# Patient Record
Sex: Female | Born: 1960 | Race: White | Hispanic: No | Marital: Single | Smoking: Former smoker
Health system: Southern US, Community
[De-identification: ages and names within clinical notes are randomized; demographics above are authoritative.]

## PROBLEM LIST (undated history)

## (undated) DIAGNOSIS — E039 Hypothyroidism, unspecified: Secondary | ICD-10-CM

## (undated) DIAGNOSIS — I10 Essential (primary) hypertension: Secondary | ICD-10-CM

## (undated) DIAGNOSIS — Z8719 Personal history of other diseases of the digestive system: Secondary | ICD-10-CM

## (undated) DIAGNOSIS — J45909 Unspecified asthma, uncomplicated: Secondary | ICD-10-CM

## (undated) DIAGNOSIS — E079 Disorder of thyroid, unspecified: Secondary | ICD-10-CM

## (undated) DIAGNOSIS — G4733 Obstructive sleep apnea (adult) (pediatric): Secondary | ICD-10-CM

## (undated) DIAGNOSIS — L309 Dermatitis, unspecified: Secondary | ICD-10-CM

## (undated) DIAGNOSIS — C73 Malignant neoplasm of thyroid gland: Secondary | ICD-10-CM

## (undated) DIAGNOSIS — K589 Irritable bowel syndrome without diarrhea: Secondary | ICD-10-CM

## (undated) HISTORY — PX: KIDNEY SURGERY: SHX687

## (undated) HISTORY — DX: Irritable bowel syndrome, unspecified: K58.9

## (undated) HISTORY — PX: ADENOIDECTOMY: SUR15

## (undated) HISTORY — DX: Obstructive sleep apnea (adult) (pediatric): G47.33

## (undated) HISTORY — DX: Unspecified asthma, uncomplicated: J45.909

## (undated) HISTORY — DX: Malignant neoplasm of thyroid gland: C73

## (undated) HISTORY — PX: VAGINAL PROLAPSE REPAIR: SHX830

## (undated) HISTORY — PX: APPENDECTOMY: SHX54

## (undated) HISTORY — DX: Hypothyroidism, unspecified: E03.9

## (undated) HISTORY — PX: THYROID LOBECTOMY: SHX420

## (undated) HISTORY — PX: URETERAL REIMPLANTION: SHX2611

## (undated) HISTORY — DX: Morbid (severe) obesity due to excess calories: E66.01

## (undated) HISTORY — PX: TONSILLECTOMY: SUR1361

## (undated) HISTORY — PX: COLONOSCOPY: SHX174

## (undated) HISTORY — DX: Personal history of other diseases of the digestive system: Z87.19

## (undated) HISTORY — PX: ABDOMINAL HYSTERECTOMY: SHX81

## (undated) HISTORY — PX: CHOLECYSTECTOMY: SHX55

---

## 2018-08-15 ENCOUNTER — Other Ambulatory Visit: Payer: Self-pay

## 2018-08-15 ENCOUNTER — Emergency Department (HOSPITAL_COMMUNITY)
Admission: EM | Admit: 2018-08-15 | Discharge: 2018-08-15 | Disposition: A | Discharge status: Home / Self Care | Payer: Self-pay | Attending: Emergency Medicine | Admitting: Emergency Medicine

## 2018-08-15 ENCOUNTER — Encounter (HOSPITAL_COMMUNITY): Payer: Self-pay | Admitting: *Deleted

## 2018-08-15 ENCOUNTER — Emergency Department (HOSPITAL_COMMUNITY): Payer: Self-pay

## 2018-08-15 DIAGNOSIS — X58XXXA Exposure to other specified factors, initial encounter: Secondary | ICD-10-CM | POA: Insufficient documentation

## 2018-08-15 DIAGNOSIS — I10 Essential (primary) hypertension: Secondary | ICD-10-CM

## 2018-08-15 DIAGNOSIS — S8391XA Sprain of unspecified site of right knee, initial encounter: Secondary | ICD-10-CM | POA: Insufficient documentation

## 2018-08-15 DIAGNOSIS — Y929 Unspecified place or not applicable: Secondary | ICD-10-CM | POA: Insufficient documentation

## 2018-08-15 DIAGNOSIS — Y999 Unspecified external cause status: Secondary | ICD-10-CM | POA: Insufficient documentation

## 2018-08-15 DIAGNOSIS — Y9389 Activity, other specified: Secondary | ICD-10-CM | POA: Insufficient documentation

## 2018-08-15 DIAGNOSIS — Z87891 Personal history of nicotine dependence: Secondary | ICD-10-CM | POA: Insufficient documentation

## 2018-08-15 HISTORY — DX: Dermatitis, unspecified: L30.9

## 2018-08-15 HISTORY — DX: Disorder of thyroid, unspecified: E07.9

## 2018-08-15 HISTORY — DX: Essential (primary) hypertension: I10

## 2018-08-15 MED ORDER — HYDROCHLOROTHIAZIDE 25 MG PO TABS: 25.0000 mg | ORAL_TABLET | Freq: Every day | ORAL | 0 refills | Status: DC

## 2018-08-15 MED ORDER — DICLOFENAC SODIUM 50 MG PO TBEC: 50.0000 mg | DELAYED_RELEASE_TABLET | Freq: Two times a day (BID) | ORAL | 0 refills | Status: DC

## 2018-08-15 NOTE — ED Triage Notes (Signed)
Pt c/o right knee pain x one week; pt states today she went to get up out of her recliner and heard a "snap"; pt states the pain is behind the knee and states she is unable to bear weight on that leg

## 2018-08-15 NOTE — ED Provider Notes (Signed)
[facility] EMERGENCY DEPARTMENT Provider Note   CSN: [id number] Arrival date & time: 08/15/18  1952     History   Chief Complaint Chief Complaint  Patient presents with  . Knee Pain    HPI Margaret Schmitt is a 57 y.o. female.  The history is provided by the patient. No language interpreter was used.  Knee Pain   This is a new problem. The current episode started more than 1 week ago. The problem occurs constantly. The problem has been gradually worsening. The pain is present in the right knee. The quality of the pain is described as aching. The pain is moderate. Associated symptoms include limited range of motion. She has tried nothing for the symptoms. The treatment provided no relief.  Pt reports knee has been painful for a week.  Pt reports she stood up from her recliner tonight and knee popped.  Pt complains of swelling and pain  Past Medical History:  Diagnosis Date  . Eczema   . Hypertension   . Thyroid disease    hypothyroidism    There are no active problems to display for this patient.   Past Surgical History:  Procedure Laterality Date  . ABDOMINAL HYSTERECTOMY    . ADENOIDECTOMY    . APPENDECTOMY    . CHOLECYSTECTOMY    . KIDNEY SURGERY     left ureter reimplantation  . THYROID LOBECTOMY Left   . TONSILLECTOMY       OB History   None      Home Medications    Prior to Admission medications   Not on File    Family History History reviewed. No pertinent family history.  Social History Social History   Tobacco Use  . Smoking status: Former Research scientist (life sciences)  . Smokeless tobacco: Never Used  Substance Use Topics  . Alcohol use: Yes    Comment: rarely  . Drug use: Not Currently     Allergies   Erythromycin   Review of Systems Review of Systems  Musculoskeletal: Positive for joint swelling.  All other systems reviewed and are negative.    Physical Exam Updated Vital Signs BP (!) 187/112 (BP Location: Right Arm)   Pulse 81   Temp 98 F  (36.7 C) (Oral)   Resp 16   Ht 5\' 7"  (1.702 m)   Wt 104.3 kg   SpO2 100%   BMI 36.02 kg/m   Physical Exam  Constitutional: She appears well-developed and well-nourished.  HENT:  Head: Normocephalic.  Right Ear: External ear normal.  Left Ear: External ear normal.  Nose: Nose normal.  Mouth/Throat: Oropharynx is clear and moist.  Eyes: Pupils are equal, round, and reactive to light.  Neck: Normal range of motion.  Cardiovascular: Normal rate.  Pulmonary/Chest: Effort normal.  Musculoskeletal: She exhibits tenderness.  Effusion right knee,  Pain with range of motion,  nv and ns intact   Neurological: She is alert.  Skin: Skin is warm.  Psychiatric: She has a normal mood and affect.  Nursing note and vitals reviewed.    ED Treatments / Results  Labs (all labs ordered are listed, but only abnormal results are displayed) Labs Reviewed - No data to display  EKG None  Radiology Dg Knee Complete 4 Views Right  Result Date: 08/15/2018 CLINICAL DATA:  Posterior knee pain EXAM: RIGHT KNEE - COMPLETE 4+ VIEW COMPARISON:  None. FINDINGS: No acute displaced fracture or malalignment. Mild mediolateral and patellofemoral degenerative change. Small knee effusion IMPRESSION: No acute osseous abnormality. Degenerative change  with small knee effusion Electronically Signed   By: Donavan Foil M.D.   On: 08/15/2018 20:49    Procedures Procedures (including critical care time)  Medications Ordered in ED Medications - No data to display   Initial Impression / Assessment and Plan / ED Course  I have reviewed the triage vital signs and the nursing notes.  Pertinent labs & imaging results that were available during my care of the patient were reviewed by me and considered in my medical decision making (see chart for details).     Pt placed in a knee immbolizer.    Final Clinical Impressions(s) / ED Diagnoses   Final diagnoses:  Sprain of right knee, unspecified ligament, initial  encounter    ED Discharge Orders    None    An After Visit Summary was printed and given to the patient.    [name], PA-C 08/15/18 2308    Margette Fast, MD 08/16/18 1023

## 2018-08-15 NOTE — Discharge Instructions (Addendum)
Return if any problems. Schedule primary care evaluation for high blood pressure.

## 2018-08-22 ENCOUNTER — Ambulatory Visit: Payer: Self-pay | Admitting: Physician Assistant

## 2018-08-22 ENCOUNTER — Encounter: Payer: Self-pay | Admitting: Physician Assistant

## 2018-08-22 VITALS — BP 178/120 | HR 82 | Temp 97.7°F | Ht 65.75 in | Wt 246.2 lb

## 2018-08-22 DIAGNOSIS — F17200 Nicotine dependence, unspecified, uncomplicated: Secondary | ICD-10-CM

## 2018-08-22 DIAGNOSIS — Z7689 Persons encountering health services in other specified circumstances: Secondary | ICD-10-CM

## 2018-08-22 DIAGNOSIS — Z131 Encounter for screening for diabetes mellitus: Secondary | ICD-10-CM

## 2018-08-22 DIAGNOSIS — E039 Hypothyroidism, unspecified: Secondary | ICD-10-CM

## 2018-08-22 DIAGNOSIS — Z8639 Personal history of other endocrine, nutritional and metabolic disease: Secondary | ICD-10-CM

## 2018-08-22 DIAGNOSIS — S8392XD Sprain of unspecified site of left knee, subsequent encounter: Secondary | ICD-10-CM

## 2018-08-22 DIAGNOSIS — I10 Essential (primary) hypertension: Secondary | ICD-10-CM

## 2018-08-22 MED ORDER — METOPROLOL TARTRATE 50 MG PO TABS: 50.0000 mg | ORAL_TABLET | Freq: Two times a day (BID) | ORAL | 1 refills | Status: DC

## 2018-08-22 MED ORDER — IBUPROFEN 800 MG PO TABS: 800.0000 mg | ORAL_TABLET | Freq: Three times a day (TID) | ORAL | 1 refills | Status: DC

## 2018-08-22 NOTE — Progress Notes (Signed)
BP (!) 179/112 (BP Location: Left Arm, Patient Position: Sitting, Cuff Size: Normal)   Pulse 82   Temp 97.7 F (36.5 C)   Ht 5' 5.75" (1.67 m)   Wt 246 lb 4 oz (111.7 kg)   SpO2 99%   BMI 40.05 kg/m    Subjective:    Patient ID: Margaret Schmitt, female    DOB: 1961-03-19, 57 y.o.   MRN: [id number]  HPI: Margaret Schmitt is a 57 y.o. female presenting on 08/22/2018 for New Patient (Initial Visit)   HPI   Pt moved here from [place] about a year ago.  Pt is using old levothyroxine but is not taking it every day.   Pt was on losartan and coreg in the past.  She says her bp at home this morning was 111 over something.  She thinks her bp is high now because she feels stress coming here.  She works at [employer] in [employer].  She works as Building control surveyor in dementia facility.   Pt seen in ER earlier this month for knee pain.   She was put in knee immobilizer and given crutches.  In the office today, she is not wearing the knee immobilizer and is only using one crutch.   She stopped the diclofenac because it was giving her weird dreams.   Her Last mammogram several years ago  She says she had Colonoscopy several years ago in [place]- has polyps  Pt says she is supposed to be getting insurance starting in January.  Relevant past medical, surgical, family and social history reviewed and updated as indicated. Interim medical history since our last visit reviewed. Allergies and medications reviewed and updated.   Current Outpatient Medications:  .  hydrochlorothiazide (HYDRODIURIL) 25 MG tablet, Take 1 tablet (25 mg total) by mouth daily., Disp: 30 tablet, Rfl: 0 .  levothyroxine (SYNTHROID, LEVOTHROID) 175 MCG tablet, Take 175 mcg by mouth daily before breakfast., Disp: , Rfl:  .  diclofenac (VOLTAREN) 50 MG EC tablet, Take 1 tablet (50 mg total) by mouth 2 (two) times daily. (Patient not taking: Reported on 08/22/2018), Disp: 20 tablet, Rfl: 0   Review of Systems  Constitutional: Negative for appetite  change, chills, diaphoresis, fatigue, fever and unexpected weight change.  HENT: Positive for dental problem. Negative for congestion, drooling, ear pain, facial swelling, hearing loss, mouth sores, sneezing, sore throat, trouble swallowing and voice change.   Eyes: Negative for pain, discharge, redness, itching and visual disturbance.  Respiratory: Negative for cough, choking, shortness of breath and wheezing.   Cardiovascular: Negative for chest pain, palpitations and leg swelling.  Gastrointestinal: Negative for abdominal pain, blood in stool, constipation, diarrhea and vomiting.  Endocrine: Negative for cold intolerance, heat intolerance and polydipsia.  Genitourinary: Negative for decreased urine volume, dysuria and hematuria.  Musculoskeletal: Positive for arthralgias, back pain and gait problem.  Skin: Negative for rash.  Allergic/Immunologic: Positive for environmental allergies.  Neurological: Negative for seizures, syncope, light-headedness and headaches.  Hematological: Negative for adenopathy.  Psychiatric/Behavioral: Negative for agitation, dysphoric mood and suicidal ideas. The patient is not nervous/anxious.     Per HPI unless specifically indicated above     Objective:    BP (!) 179/112 (BP Location: Left Arm, Patient Position: Sitting, Cuff Size: Normal)   Pulse 82   Temp 97.7 F (36.5 C)   Ht 5' 5.75" (1.67 m)   Wt 246 lb 4 oz (111.7 kg)   SpO2 99%   BMI 40.05 kg/m   Wt Readings from Last  3 Encounters:  08/22/18 246 lb 4 oz (111.7 kg)  08/15/18 230 lb (104.3 kg)    Physical Exam  Constitutional: She is oriented to person, place, and time. She appears well-developed and well-nourished.  HENT:  Head: Normocephalic and atraumatic.  Mouth/Throat: Oropharynx is clear and moist. No oropharyngeal exudate.  Eyes: Pupils are equal, round, and reactive to light. Conjunctivae and EOM are normal.  Neck: Neck supple. No thyromegaly present.  Cardiovascular: Normal rate  and regular rhythm.  Pulmonary/Chest: Effort normal and breath sounds normal.  Abdominal: Soft. Bowel sounds are normal. She exhibits no mass. There is no hepatosplenomegaly. There is no tenderness.  Musculoskeletal: She exhibits no edema.       Left knee: She exhibits swelling (mild). She exhibits normal range of motion, no erythema, normal alignment, no LCL laxity, normal patellar mobility, no bony tenderness and no MCL laxity. Tenderness found.  Lymphadenopathy:    She has no cervical adenopathy.  Neurological: She is alert and oriented to person, place, and time. Gait normal.  Skin: Skin is warm and dry.  Psychiatric: She has a normal mood and affect. Her behavior is normal.  Vitals reviewed.   No results found for this or any previous visit.    Assessment & Plan:   Encounter Diagnoses  Name Primary?  . Encounter to establish care Yes  . Essential hypertension   . Sprain of left knee, unspecified ligament, subsequent encounter   . Hypothyroidism, unspecified type   . Screening for diabetes mellitus   . History of hyperlipidemia   . Tobacco use disorder   . Morbid obesity ([place])      -will get baseline Labs -pt was given [facility] charity care application -counseled Smoking cessation -for her knee recommended Ice 10-20 minutes 4 or 5 times daily, she needs to wear the immobilizer or and ace wrap, crutches, NSAID -Stop hctz.  rx metoprolol  -Will get pt on medassist when we get her on the correct medication -pt given not to RTO on sedentary duty -pt to follow up 2 week to recheck BP, knee and review labs.  Pt to RTO sooner prn

## 2018-09-05 ENCOUNTER — Encounter: Payer: Self-pay | Admitting: Physician Assistant

## 2018-09-05 ENCOUNTER — Ambulatory Visit: Payer: Self-pay | Admitting: Physician Assistant

## 2018-09-05 VITALS — BP 130/90 | HR 70 | Temp 97.5°F | Ht 65.75 in | Wt 253.0 lb

## 2018-09-05 DIAGNOSIS — F17200 Nicotine dependence, unspecified, uncomplicated: Secondary | ICD-10-CM

## 2018-09-05 DIAGNOSIS — E039 Hypothyroidism, unspecified: Secondary | ICD-10-CM

## 2018-09-05 DIAGNOSIS — I10 Essential (primary) hypertension: Secondary | ICD-10-CM

## 2018-09-05 NOTE — Progress Notes (Signed)
BP 130/90 (BP Location: Left Arm, Patient Position: Sitting, Cuff Size: Normal)   Pulse 70   Temp (!) 97.5 F (36.4 C)   Ht 5' 5.75" (1.67 m)   Wt 253 lb (114.8 kg)   SpO2 97%   BMI 41.15 kg/m    Subjective:    Patient ID: Margaret Schmitt, female    DOB: 06-Sep-1961, 57 y.o.   MRN: [id number]  HPI: Margaret Schmitt is a 57 y.o. female presenting on 09/05/2018 for Hypertension and Knee Problem   HPI   -pt did not get labs drawn -she says her knee is all better and she wants note to return to work on full duty   Relevant past medical, surgical, family and social history reviewed and updated as indicated. Interim medical history since our last visit reviewed. Allergies and medications reviewed and updated.   Current Outpatient Medications:  .  ibuprofen (ADVIL,MOTRIN) 800 MG tablet, Take 1 tablet (800 mg total) by mouth every 8 (eight) hours., Disp: 30 tablet, Rfl: 1 .  levothyroxine (SYNTHROID, LEVOTHROID) 175 MCG tablet, Take 175 mcg by mouth daily before breakfast., Disp: , Rfl:  .  metoprolol tartrate (LOPRESSOR) 50 MG tablet, Take 1 tablet (50 mg total) by mouth 2 (two) times daily. (Patient taking differently: Take 50 mg by mouth daily. ), Disp: 60 tablet, Rfl: 1   Review of Systems  Constitutional: Negative for appetite change, chills, diaphoresis, fatigue, fever and unexpected weight change.  HENT: Positive for dental problem. Negative for congestion, drooling, ear pain, facial swelling, hearing loss, mouth sores, sneezing, sore throat, trouble swallowing and voice change.   Eyes: Negative for pain, discharge, redness, itching and visual disturbance.  Respiratory: Negative for cough, choking, shortness of breath and wheezing.   Cardiovascular: Negative for chest pain, palpitations and leg swelling.  Gastrointestinal: Negative for abdominal pain, blood in stool, constipation, diarrhea and vomiting.  Endocrine: Negative for cold intolerance, heat intolerance and polydipsia.   Genitourinary: Negative for decreased urine volume, dysuria and hematuria.  Musculoskeletal: Negative for arthralgias, back pain and gait problem.  Skin: Negative for rash.  Allergic/Immunologic: Negative for environmental allergies.  Neurological: Negative for seizures, syncope, light-headedness and headaches.  Hematological: Negative for adenopathy.  Psychiatric/Behavioral: Negative for agitation, dysphoric mood and suicidal ideas. The patient is not nervous/anxious.     Per HPI unless specifically indicated above     Objective:    BP 130/90 (BP Location: Left Arm, Patient Position: Sitting, Cuff Size: Normal)   Pulse 70   Temp (!) 97.5 F (36.4 C)   Ht 5' 5.75" (1.67 m)   Wt 253 lb (114.8 kg)   SpO2 97%   BMI 41.15 kg/m   Wt Readings from Last 3 Encounters:  09/05/18 253 lb (114.8 kg)  08/22/18 246 lb 4 oz (111.7 kg)  08/15/18 230 lb (104.3 kg)    Physical Exam  Constitutional: She is oriented to person, place, and time. She appears well-developed and well-nourished.  HENT:  Head: Normocephalic and atraumatic.  Neck: Neck supple.  Cardiovascular: Normal rate and regular rhythm.  Pulmonary/Chest: Effort normal and breath sounds normal.  Abdominal: Soft. Bowel sounds are normal. She exhibits no mass. There is no hepatosplenomegaly. There is no tenderness.  Musculoskeletal: She exhibits no edema.       Right knee: Normal.       Left knee: Normal.  Lymphadenopathy:    She has no cervical adenopathy.  Neurological: She is alert and oriented to person, place, and time.  Skin:  Skin is warm and dry.  Psychiatric: She has a normal mood and affect. Her behavior is normal.  Nursing note and vitals reviewed.   No results found for this or any previous visit.    Assessment & Plan:   Encounter Diagnoses  Name Primary?  . Essential hypertension Yes  . Hypothyroidism, unspecified type   . Tobacco use disorder   . Morbid obesity ([place])      -pt given note to RTW -pt  counseled to get labs drawn -pt to continue current medications -pt to follow up 1 month to recheck BP and review labs.  RTO sooner prn

## 2018-09-09 ENCOUNTER — Ambulatory Visit: Payer: Self-pay | Admitting: Physician Assistant

## 2018-10-14 ENCOUNTER — Ambulatory Visit: Payer: Self-pay | Admitting: Physician Assistant

## 2018-10-31 ENCOUNTER — Ambulatory Visit: Payer: Self-pay | Admitting: Physician Assistant

## 2018-11-12 ENCOUNTER — Other Ambulatory Visit (HOSPITAL_COMMUNITY)
Admission: RE | Admit: 2018-11-12 | Discharge: 2018-11-12 | Disposition: A | Discharge status: Home / Self Care | Payer: Self-pay | Source: Ambulatory Visit | Attending: Physician Assistant | Admitting: Physician Assistant

## 2018-11-12 DIAGNOSIS — Z131 Encounter for screening for diabetes mellitus: Secondary | ICD-10-CM | POA: Insufficient documentation

## 2018-11-12 DIAGNOSIS — Z8639 Personal history of other endocrine, nutritional and metabolic disease: Secondary | ICD-10-CM | POA: Insufficient documentation

## 2018-11-12 DIAGNOSIS — I10 Essential (primary) hypertension: Secondary | ICD-10-CM | POA: Insufficient documentation

## 2018-11-12 DIAGNOSIS — E039 Hypothyroidism, unspecified: Secondary | ICD-10-CM | POA: Insufficient documentation

## 2018-11-12 LAB — LIPID PANEL
Cholesterol: 199 mg/dL (ref 0–200)
HDL: 73 mg/dL (ref >40)
LDL Cholesterol: 106 mg/dL — ABNORMAL HIGH (ref 0–99)
Total CHOL/HDL Ratio: 2.7 RATIO
Triglycerides: 102 mg/dL (ref <150)
VLDL: 20 mg/dL (ref 0–40)

## 2018-11-12 LAB — COMPREHENSIVE METABOLIC PANEL
ALT: 13 U/L (ref 0–44)
AST: 16 U/L (ref 15–41)
Albumin: 4.2 g/dL (ref 3.5–5.0)
Alkaline Phosphatase: 60 U/L (ref 38–126)
Anion gap: 9 (ref 5–15)
BUN: 23 mg/dL — AB (ref 6–20)
CO2: 24 mmol/L (ref 22–32)
CREATININE: 0.89 mg/dL (ref 0.44–1.00)
Calcium: 9.1 mg/dL (ref 8.9–10.3)
Chloride: 110 mmol/L (ref 98–111)
GFR calc Af Amer: >60 mL/min (ref >60)
GFR calc non Af Amer: >60 mL/min (ref >60)
Glucose, Bld: 83 mg/dL (ref 70–99)
POTASSIUM: 3.6 mmol/L (ref 3.5–5.1)
Sodium: 143 mmol/L (ref 135–145)
TOTAL PROTEIN: 7.4 g/dL (ref 6.5–8.1)
Total Bilirubin: 0.5 mg/dL (ref 0.3–1.2)

## 2018-11-12 LAB — HEMOGLOBIN A1C
Hgb A1c MFr Bld: 5.8 % — ABNORMAL HIGH (ref 4.8–5.6)
Mean Plasma Glucose: 119.76 mg/dL

## 2018-11-12 LAB — TSH: TSH: 2.604 u[IU]/mL (ref 0.350–4.500)

## 2018-11-14 ENCOUNTER — Encounter: Payer: Self-pay | Admitting: Physician Assistant

## 2018-11-14 ENCOUNTER — Ambulatory Visit: Payer: Self-pay | Admitting: Physician Assistant

## 2018-11-14 VITALS — BP 133/88 | HR 59 | Temp 97.3°F | Ht 65.75 in | Wt 268.5 lb

## 2018-11-14 DIAGNOSIS — M25561 Pain in right knee: Secondary | ICD-10-CM

## 2018-11-14 DIAGNOSIS — I10 Essential (primary) hypertension: Secondary | ICD-10-CM

## 2018-11-14 DIAGNOSIS — R7303 Prediabetes: Secondary | ICD-10-CM

## 2018-11-14 DIAGNOSIS — Z1239 Encounter for other screening for malignant neoplasm of breast: Secondary | ICD-10-CM

## 2018-11-14 DIAGNOSIS — F17200 Nicotine dependence, unspecified, uncomplicated: Secondary | ICD-10-CM

## 2018-11-14 DIAGNOSIS — E039 Hypothyroidism, unspecified: Secondary | ICD-10-CM

## 2018-11-14 NOTE — Progress Notes (Signed)
BP 133/88 (BP Location: Right Arm, Patient Position: Sitting, Cuff Size: Large)   Pulse (!) 59   Temp (!) 97.3 F (36.3 C)   Ht 5' 5.75" (1.67 m)   Wt 268 lb 8 oz (121.8 kg)   SpO2 99%   BMI 43.67 kg/m    Subjective:    Patient ID: Margaret Schmitt, female    DOB: 03-09-1961, 58 y.o.   MRN: [id number]  HPI: Margaret Schmitt is a 58 y.o. female presenting on 11/14/2018 for Follow-up   HPI   Pt had colonoscopy about 3 years ago in [place].  She had some polyps.  She will bring the information about the practice name- she has it at home but doesn't remember it.   Pt states still with R knee pain.  More after working a shift.  Some swelling.  Pt did not turn in her application for [facility] care that she was given at her last appointment.    Pt not sure what meds she is taking because she did not bring them with her as requested   Relevant past medical, surgical, family and social history reviewed and updated as indicated. Interim medical history since our last visit reviewed. Allergies and medications reviewed and updated.   Current Outpatient Medications:  .  ibuprofen (ADVIL,MOTRIN) 800 MG tablet, Take 1 tablet (800 mg total) by mouth every 8 (eight) hours., Disp: 30 tablet, Rfl: 1 .  levothyroxine (SYNTHROID, LEVOTHROID) 175 MCG tablet, Take 175 mcg by mouth daily before breakfast., Disp: , Rfl:  .  metoprolol tartrate (LOPRESSOR) 50 MG tablet, Take 1 tablet (50 mg total) by mouth 2 (two) times daily. (Patient taking differently: Take 50 mg by mouth daily. ), Disp: 60 tablet, Rfl: 1   Review of Systems  Constitutional: Negative for appetite change, chills, diaphoresis, fatigue, fever and unexpected weight change.  HENT: Positive for congestion, dental problem and sneezing. Negative for drooling, ear pain, facial swelling, hearing loss, mouth sores, sore throat, trouble swallowing and voice change.   Eyes: Negative for pain, discharge, redness, itching and visual disturbance.   Respiratory: Positive for cough. Negative for choking, shortness of breath and wheezing.   Cardiovascular: Negative for chest pain, palpitations and leg swelling.  Gastrointestinal: Negative for abdominal pain, blood in stool, constipation, diarrhea and vomiting.  Endocrine: Negative for cold intolerance, heat intolerance and polydipsia.  Genitourinary: Negative for decreased urine volume, dysuria and hematuria.  Musculoskeletal: Positive for arthralgias and back pain. Negative for gait problem.  Skin: Negative for rash.  Allergic/Immunologic: Positive for environmental allergies.  Neurological: Negative for seizures, syncope, light-headedness and headaches.  Hematological: Negative for adenopathy.  Psychiatric/Behavioral: Negative for agitation, dysphoric mood and suicidal ideas. The patient is not nervous/anxious.     Per HPI unless specifically indicated above     Objective:    BP 133/88 (BP Location: Right Arm, Patient Position: Sitting, Cuff Size: Large)   Pulse (!) 59   Temp (!) 97.3 F (36.3 C)   Ht 5' 5.75" (1.67 m)   Wt 268 lb 8 oz (121.8 kg)   SpO2 99%   BMI 43.67 kg/m   Wt Readings from Last 3 Encounters:  11/14/18 268 lb 8 oz (121.8 kg)  09/05/18 253 lb (114.8 kg)  08/22/18 246 lb 4 oz (111.7 kg)    Physical Exam Vitals signs reviewed.  Constitutional:      Appearance: She is well-developed.  HENT:     Head: Normocephalic and atraumatic.  Neck:  Musculoskeletal: Neck supple.  Cardiovascular:     Rate and Rhythm: Normal rate and regular rhythm.  Pulmonary:     Effort: Pulmonary effort is normal.     Breath sounds: Normal breath sounds.  Abdominal:     General: Bowel sounds are normal.     Palpations: Abdomen is soft. There is no mass.     Tenderness: There is no abdominal tenderness.  Musculoskeletal:     Right knee: She exhibits normal range of motion, no swelling and no deformity. Tenderness found.  Lymphadenopathy:     Cervical: No cervical  adenopathy.  Skin:    General: Skin is warm and dry.  Neurological:     Mental Status: She is alert and oriented to person, place, and time.  Psychiatric:        Behavior: Behavior normal.     Results for orders placed or performed during the hospital encounter of 11/12/18  Hemoglobin A1c  Result Value Ref Range   Hgb A1c MFr Bld 5.8 (H) 4.8 - 5.6 %   Mean Plasma Glucose 119.76 mg/dL  Lipid panel  Result Value Ref Range   Cholesterol 199 0 - 200 mg/dL   Triglycerides 102 <150 mg/dL   HDL 73 >40 mg/dL   Total CHOL/HDL Ratio 2.7 RATIO   VLDL 20 0 - 40 mg/dL   LDL Cholesterol 106 (H) 0 - 99 mg/dL  Comprehensive metabolic panel  Result Value Ref Range   Sodium 143 135 - 145 mmol/L   Potassium 3.6 3.5 - 5.1 mmol/L   Chloride 110 98 - 111 mmol/L   CO2 24 22 - 32 mmol/L   Glucose, Bld 83 70 - 99 mg/dL   BUN 23 (H) 6 - 20 mg/dL   Creatinine, Ser 0.89 0.44 - 1.00 mg/dL   Calcium 9.1 8.9 - 10.3 mg/dL   Total Protein 7.4 6.5 - 8.1 g/dL   Albumin 4.2 3.5 - 5.0 g/dL   AST 16 15 - 41 U/L   ALT 13 0 - 44 U/L   Alkaline Phosphatase 60 38 - 126 U/L   Total Bilirubin 0.5 0.3 - 1.2 mg/dL   GFR calc non Af Amer >60 >60 mL/min   GFR calc Af Amer >60 >60 mL/min   Anion gap 9 5 - 15  TSH  Result Value Ref Range   TSH 2.604 0.350 - 4.500 uIU/mL      Assessment & Plan:   Encounter Diagnoses  Name Primary?  . Essential hypertension Yes  . Hypothyroidism, unspecified type   . Tobacco use disorder   . Morbid obesity ([place])   . Prediabetes   . Screening for breast cancer   . Right knee pain, unspecified chronicity    -reviewed labs with pt -counseled pt on lipids and prediabetes -Counseled pt to bring all meds to every appointment -Ordered screening mammogram -pt was Given another [facility] charity care application (she didn't turn hers in yet) -Refer to orthopedist for continuing knee pain. Counseled pt that weight loss would help the knee.  Discussed with pt that she will need to be  approved for charity care prior to seeing the orthopedist -no medication changes today -cousneld smoking cessation -pt to follow up 3 months.  RTO sooner prn

## 2018-11-14 NOTE — Patient Instructions (Signed)
Cholesterol Cholesterol is a white, waxy, fat-like substance that is needed by the human body in small amounts. The liver makes all the cholesterol we need. Cholesterol is carried from the liver by the blood through the blood vessels. Deposits of cholesterol (plaques) may build up on blood vessel (artery) walls. Plaques make the arteries narrower and stiffer. Cholesterol plaques increase the risk for heart attack and stroke. You cannot feel your cholesterol level even if it is very high. The only way to know that it is high is to have a blood test. Once you know your cholesterol levels, you should keep a record of the test results. Work with your health care provider to keep your levels in the desired range. What do the results mean?  Total cholesterol is a rough measure of all the cholesterol in your blood.  LDL (low-density lipoprotein) is the "bad" cholesterol. This is the type that causes plaque to build up on the artery walls. You want this level to be low.  HDL (high-density lipoprotein) is the "good" cholesterol because it cleans the arteries and carries the LDL away. You want this level to be high.  Triglycerides are fat that the body can either burn for energy or store. High levels are closely linked to heart disease. What are the desired levels of cholesterol?  Total cholesterol below 200.  LDL below 100 for people who are at risk, below 70 for people at very high risk.  HDL above 40 is good. A level of 60 or higher is considered to be protective against heart disease.  Triglycerides below 150. How can I lower my cholesterol? Diet Follow your diet program as told by your health care provider.  Choose fish or white meat chicken and [place], roasted or baked. Limit fatty cuts of red meat, fried foods, and processed meats, such as sausage and lunch meats.  Eat lots of fresh fruits and vegetables.  Choose whole grains, beans, pasta, potatoes, and cereals.  Choose olive oil, corn  oil, or canola oil, and use only small amounts.  Avoid butter, mayonnaise, shortening, or palm kernel oils.  Avoid foods with trans fats.  Drink skim or nonfat milk and eat low-fat or nonfat yogurt and cheeses. Avoid whole milk, cream, ice cream, egg yolks, and full-fat cheeses.  Healthier desserts include angel food cake, ginger snaps, animal crackers, hard candy, popsicles, and low-fat or nonfat frozen yogurt. Avoid pastries, cakes, pies, and cookies.  Exercise  Follow your exercise program as told by your health care provider. A regular program: ? Helps to decrease LDL and raise HDL. ? Helps with weight control.  Do things that increase your activity level, such as gardening, walking, and taking the stairs.  Ask your health care provider about ways that you can be more active in your daily life. Medicine  Take over-the-counter and prescription medicines only as told by your health care provider. ? Medicine may be prescribed by your health care provider to help lower cholesterol and decrease the risk for heart disease. This is usually done if diet and exercise have failed to bring down cholesterol levels. ? If you have several risk factors, you may need medicine even if your levels are normal. This information is not intended to replace advice given to you by your health care provider. Make sure you discuss any questions you have with your health care provider. Document Released: 07/11/2001 Document Revised: 05/13/2016 Document Reviewed: 04/15/2016 Elsevier Interactive Patient Education  2019 [employer].   ----------------------------------------------------------  Prediabetes Prediabetes is the condition of having a blood sugar (blood glucose) level that is higher than it should be, but not high enough for you to be diagnosed with type 2 diabetes. Having prediabetes puts you at risk for developing type 2 diabetes (type 2 diabetes mellitus). Prediabetes may be called impaired  glucose tolerance or impaired fasting glucose. Prediabetes usually does not cause symptoms. Your health care provider can diagnose this condition with blood tests. You may be tested for prediabetes if you are overweight and if you have at least one other risk factor for prediabetes. What is blood glucose, and how is it measured? Blood glucose refers to the amount of glucose in your bloodstream. Glucose comes from eating foods that contain sugars and starches (carbohydrates), which the body breaks down into glucose. Your blood glucose level may be measured in mg/dL (milligrams per deciliter) or mmol/L (millimoles per liter). Your blood glucose may be checked with one or more of the following blood tests:  A fasting blood glucose (FBG) test. You will not be allowed to eat (you will fast) for 8 hours or longer before a blood sample is taken. ? A normal range for FBG is 70-100 mg/dl (3.9-5.6 mmol/L).  An A1c (hemoglobin A1c) blood test. This test provides information about blood glucose control over the previous 2?76months.  An oral glucose tolerance test (OGTT). This test measures your blood glucose at two times: ? After fasting. This is your baseline level. ? Two hours after you drink a beverage that contains glucose. You may be diagnosed with prediabetes:  If your FBG is 100?125 mg/dL (5.6-6.9 mmol/L).  If your A1c level is 5.7?6.4%.  If your OGTT result is 140?199 mg/dL (7.8-11 mmol/L). These blood tests may be repeated to confirm your diagnosis. How can this condition affect me? The pancreas produces a hormone (insulin) that helps to move glucose from the bloodstream into cells. When cells in the body do not respond properly to insulin that the body makes (insulin resistance), excess glucose builds up in the blood instead of going into cells. As a result, high blood glucose (hyperglycemia) can develop, which can cause many complications. Hyperglycemia is a symptom of prediabetes. Having high  blood glucose for a long time is dangerous. Too much glucose in your blood can damage your nerves and blood vessels. Long-term damage can lead to complications from diabetes, which may include:  Heart disease.  Stroke.  Blindness.  Kidney disease.  Depression.  Poor circulation in the feet and legs, which could lead to surgical removal (amputation) in severe cases. What can increase my risk? Risk factors for prediabetes include:  Having a family member with type 2 diabetes.  Being overweight or obese.  Being older than age 64.  Being of American [place], African-American, Hispanic/Latino, or Asian/Pacific Islander descent.  Having an inactive (sedentary) lifestyle.  Having a history of heart disease.  History of gestational diabetes or polycystic ovary syndrome (PCOS), in women.  Having low levels of good cholesterol (HDL-C) or high levels of blood fats (triglycerides).  Having high blood pressure. What actions can I take to prevent diabetes?      Be physically active. ? Do moderate-intensity physical activity for 30 or more minutes on 5 or more days of the week, or as much as told by your health care provider. This could be brisk walking, biking, or water aerobics. ? Ask your health care provider what activities are safe for you. A mix of physical activities may be best, such  as walking, swimming, cycling, and strength training.  Lose weight as told by your health care provider. ? Losing 5-7% of your body weight can reverse insulin resistance. ? Your health care provider can determine how much weight loss is best for you and can help you lose weight safely.  Follow a healthy meal plan. This includes eating lean proteins, complex carbohydrates, fresh fruits and vegetables, low-fat dairy products, and healthy fats. ? Follow instructions from your health care provider about eating or drinking restrictions. ? Make an appointment to see a diet and nutrition specialist  (registered dietitian) to help you create a healthy eating plan that is right for you.  Do not smoke or use any tobacco products, such as cigarettes, chewing tobacco, and e-cigarettes. If you need help quitting, ask your health care provider.  Take over-the-counter and prescription medicines as told by your health care provider. You may be prescribed medicines that help lower the risk of type 2 diabetes.  Keep all follow-up visits as told by your health care provider. This is important. Summary  Prediabetes is the condition of having a blood sugar (blood glucose) level that is higher than it should be, but not high enough for you to be diagnosed with type 2 diabetes.  Having prediabetes puts you at risk for developing type 2 diabetes (type 2 diabetes mellitus).  To help prevent type 2 diabetes, make lifestyle changes such as being physically active and eating a healthy diet. Lose weight as told by your health care provider. This information is not intended to replace advice given to you by your health care provider. Make sure you discuss any questions you have with your health care provider. Document Released: 02/07/2016 Document Revised: 06/05/2017 Document Reviewed: 12/07/2015 Elsevier Interactive Patient Education  2019 [employer].

## 2018-11-28 ENCOUNTER — Other Ambulatory Visit: Payer: Self-pay | Admitting: Physician Assistant

## 2018-11-28 DIAGNOSIS — Z1239 Encounter for other screening for malignant neoplasm of breast: Secondary | ICD-10-CM

## 2018-12-05 ENCOUNTER — Other Ambulatory Visit: Payer: Self-pay | Admitting: Physician Assistant

## 2018-12-05 DIAGNOSIS — Z1239 Encounter for other screening for malignant neoplasm of breast: Secondary | ICD-10-CM

## 2018-12-18 ENCOUNTER — Ambulatory Visit (HOSPITAL_COMMUNITY)
Admission: RE | Admit: 2018-12-18 | Discharge: 2018-12-18 | Disposition: A | Discharge status: Home / Self Care | Payer: Self-pay | Source: Ambulatory Visit | Attending: Physician Assistant | Admitting: Physician Assistant

## 2018-12-18 DIAGNOSIS — Z1239 Encounter for other screening for malignant neoplasm of breast: Secondary | ICD-10-CM | POA: Insufficient documentation

## 2018-12-26 ENCOUNTER — Other Ambulatory Visit (HOSPITAL_COMMUNITY): Payer: Self-pay | Admitting: Physician Assistant

## 2018-12-26 DIAGNOSIS — R928 Other abnormal and inconclusive findings on diagnostic imaging of breast: Secondary | ICD-10-CM

## 2018-12-29 ENCOUNTER — Other Ambulatory Visit: Payer: Self-pay | Admitting: Physician Assistant

## 2019-01-08 ENCOUNTER — Other Ambulatory Visit: Payer: Self-pay | Admitting: Physician Assistant

## 2019-01-08 MED ORDER — LEVOTHYROXINE SODIUM 175 MCG PO TABS: 175.0000 ug | ORAL_TABLET | Freq: Every day | ORAL | 3 refills | Status: DC

## 2019-01-14 ENCOUNTER — Ambulatory Visit (HOSPITAL_COMMUNITY)
Admission: RE | Admit: 2019-01-14 | Discharge: 2019-01-14 | Disposition: A | Discharge status: Home / Self Care | Payer: Self-pay | Source: Ambulatory Visit | Attending: Physician Assistant | Admitting: Physician Assistant

## 2019-01-14 ENCOUNTER — Other Ambulatory Visit: Payer: Self-pay

## 2019-01-14 DIAGNOSIS — R928 Other abnormal and inconclusive findings on diagnostic imaging of breast: Secondary | ICD-10-CM | POA: Insufficient documentation

## 2019-02-17 ENCOUNTER — Encounter: Payer: Self-pay | Admitting: Physician Assistant

## 2019-02-17 ENCOUNTER — Ambulatory Visit: Payer: Self-pay | Admitting: Physician Assistant

## 2019-02-17 DIAGNOSIS — I10 Essential (primary) hypertension: Secondary | ICD-10-CM

## 2019-02-17 DIAGNOSIS — E039 Hypothyroidism, unspecified: Secondary | ICD-10-CM

## 2019-02-17 DIAGNOSIS — M25561 Pain in right knee: Secondary | ICD-10-CM

## 2019-02-17 NOTE — Progress Notes (Signed)
There were no vitals taken for this visit.   Subjective:    Patient ID: Margaret Schmitt, female    DOB: 11/22/1960, 58 y.o.   MRN: [id number]  HPI: Margaret Schmitt is a 58 y.o. female presenting on 02/17/2019 for No chief complaint on file.   HPI   This is a telemedicine visit through [employer] due to coronavirus pandemic  I connected with  Margaret Schmitt on 02/17/19 by a video enabled telemedicine application and verified that I am speaking with the correct person using two identifiers.   I discussed the limitations of evaluation and management by telemedicine. The patient expressed understanding and agreed to proceed.   Pt got a bicycle with her stimulus check  pt has still not submitted her application for [employer] financial assistance.   She just sent out her application to her son who will print it to send it in.  She is wanting to go to orthopedist for knee.  She was given application on 15/40/0867 but has still not submitted it.   Her knee was hurting at that time.   At [place] 09/05/18 her knee was all better per her report.   At [place] 11/14/18 however she complained again of the knee pain.  She had still not submitted the application financial assistance and she was reminded to do so.     Pt is not having any other problems besides the knee which is continuing to hurt as it has at last several appointments without changes in symptoms  She says she hasn't smoked in a couple of weeks.    Relevant past medical, surgical, family and social history reviewed and updated as indicated. Interim medical history since our last visit reviewed. Allergies and medications reviewed and updated.    Current Outpatient Medications:  .  ibuprofen (ADVIL,MOTRIN) 800 MG tablet, Take 1 tablet (800 mg total) by mouth every 8 (eight) hours., Disp: 30 tablet, Rfl: 1 .  levothyroxine (SYNTHROID, LEVOTHROID) 175 MCG tablet, Take 1 tablet (175 mcg total) by mouth daily before breakfast., Disp: 30 tablet, Rfl: 3 .   metoprolol tartrate (LOPRESSOR) 50 MG tablet, Take 1 tablet by mouth twice daily, Disp: 60 tablet, Rfl: 0  Review of Systems  Per HPI unless specifically indicated above     Objective:    There were no vitals taken for this visit.  Wt Readings from Last 3 Encounters:  11/14/18 268 lb 8 oz (121.8 kg)  09/05/18 253 lb (114.8 kg)  08/22/18 246 lb 4 oz (111.7 kg)    Physical Exam Constitutional:      General: She is not in acute distress.    Appearance: She is not ill-appearing.  HENT:     Head: Normocephalic and atraumatic.  Pulmonary:     Effort: Pulmonary effort is normal. No respiratory distress.  Neurological:     Mental Status: She is alert and oriented to person, place, and time.  Psychiatric:        Mood and Affect: Mood normal.          Assessment & Plan:    Encounter Diagnoses  Name Primary?  . Right knee pain, unspecified chronicity Yes  . Essential hypertension   . Hypothyroidism, unspecified type     -pt will Contine current medications -will Defer labs at this time due to CV19 -Pt encouraged to get [employer] [facility] care application submitted -will enter referral for knee -pt is congratulated on efforts to stop smoking and is Encouraged to continue to avoid smoking -  encouraged pt to wear face Face covering/mask when she must go to grocery, etc in accordance with CDC recomendations -pt will Follow up early July.  Pt to contact office sooner if needed

## 2019-03-01 ENCOUNTER — Other Ambulatory Visit: Payer: Self-pay | Admitting: Physician Assistant

## 2019-03-13 ENCOUNTER — Other Ambulatory Visit: Payer: Self-pay | Admitting: Physician Assistant

## 2019-04-03 ENCOUNTER — Encounter: Payer: Self-pay | Admitting: Radiology

## 2019-05-06 ENCOUNTER — Ambulatory Visit: Payer: Self-pay | Admitting: Physician Assistant

## 2019-05-12 ENCOUNTER — Other Ambulatory Visit: Payer: Self-pay | Admitting: Physician Assistant

## 2019-05-12 DIAGNOSIS — E039 Hypothyroidism, unspecified: Secondary | ICD-10-CM

## 2019-05-12 DIAGNOSIS — I10 Essential (primary) hypertension: Secondary | ICD-10-CM

## 2019-05-19 ENCOUNTER — Ambulatory Visit: Payer: Self-pay | Admitting: Physician Assistant

## 2019-06-18 ENCOUNTER — Other Ambulatory Visit: Payer: Self-pay | Admitting: Physician Assistant

## 2019-07-28 ENCOUNTER — Other Ambulatory Visit: Payer: Self-pay | Admitting: Physician Assistant

## 2019-08-26 IMAGING — MG DIGITAL DIAGNOSTIC UNILATERAL LEFT MAMMOGRAM WITH TOMO AND CAD
4 series · 4 of 12 positions shown · non-contrast
Comparison: Previous exam(s).

CLINICAL DATA: 57-year-old female recalled from new baseline
screening mammogram dated 12/18/2018 for a possible left breast
mass.

EXAM:
DIGITAL DIAGNOSTIC LEFT MAMMOGRAM WITH CAD AND TOMO
ULTRASOUND LEFT BREAST

[L CC synth-2D]
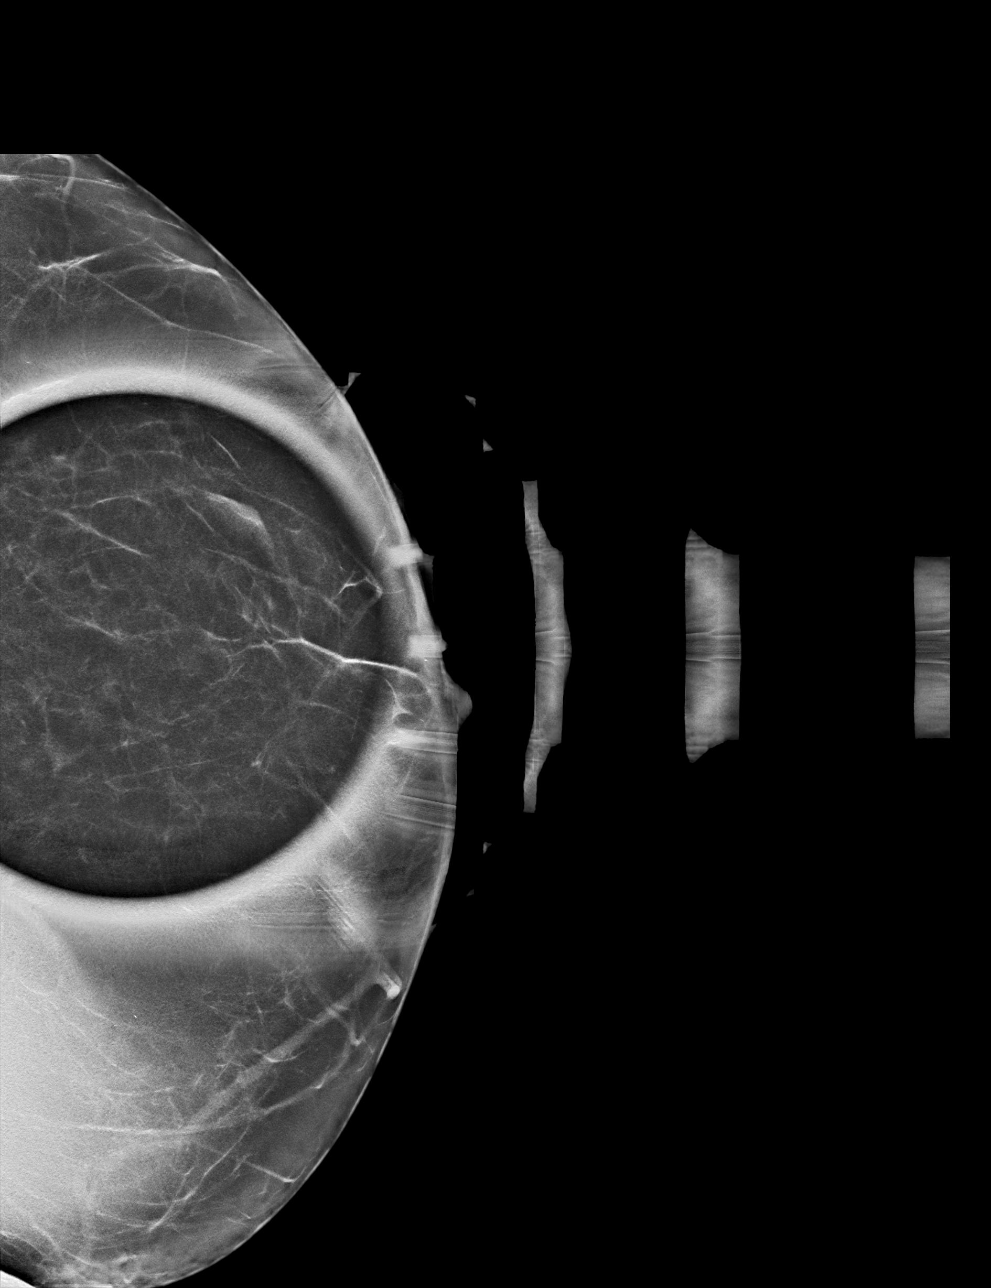

[L MLO synth-2D]
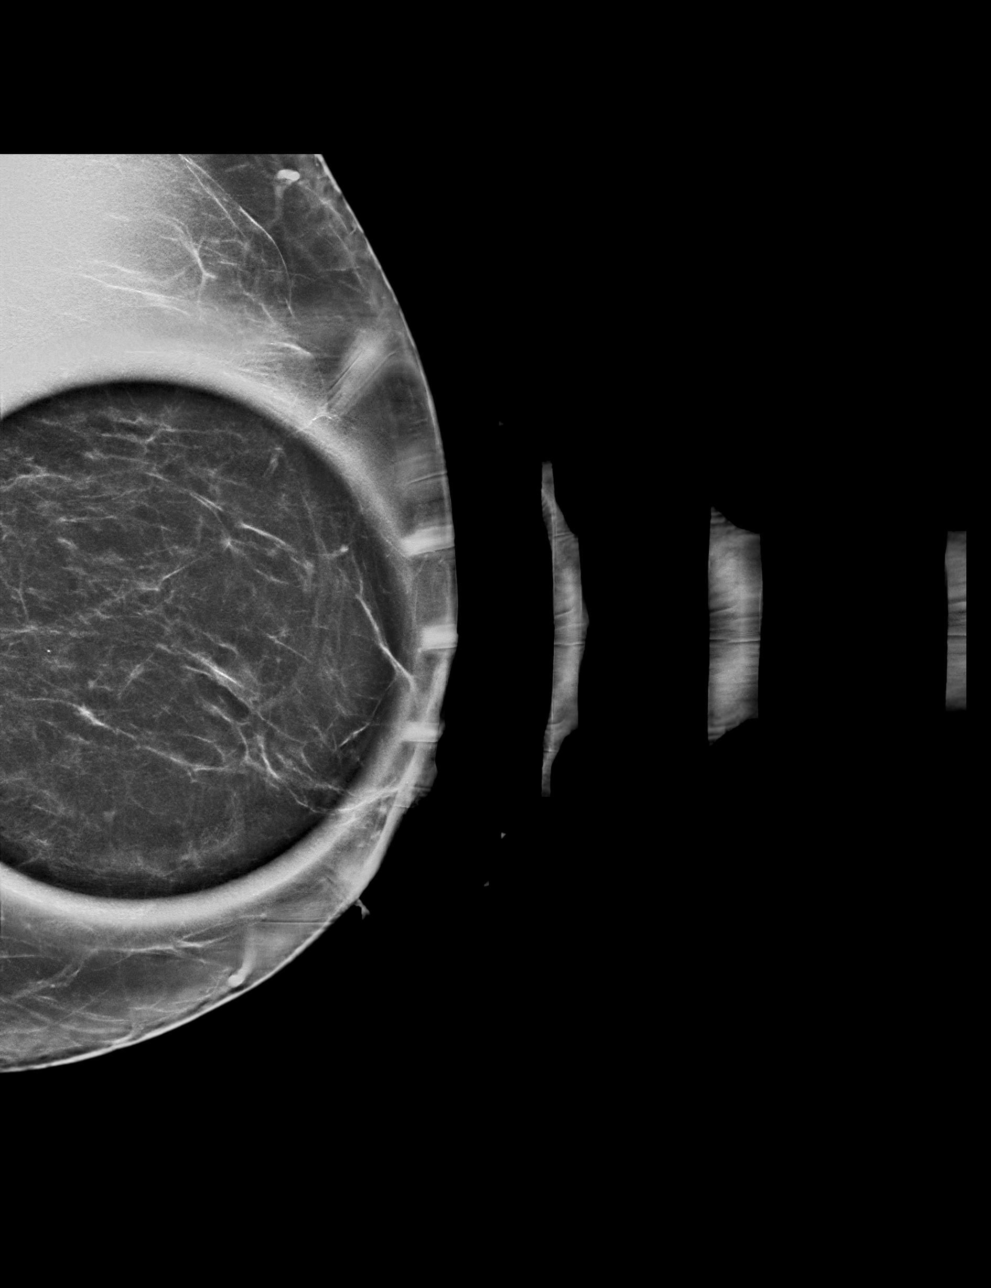

[L MLO tomo · tomo slice 37/73.0]
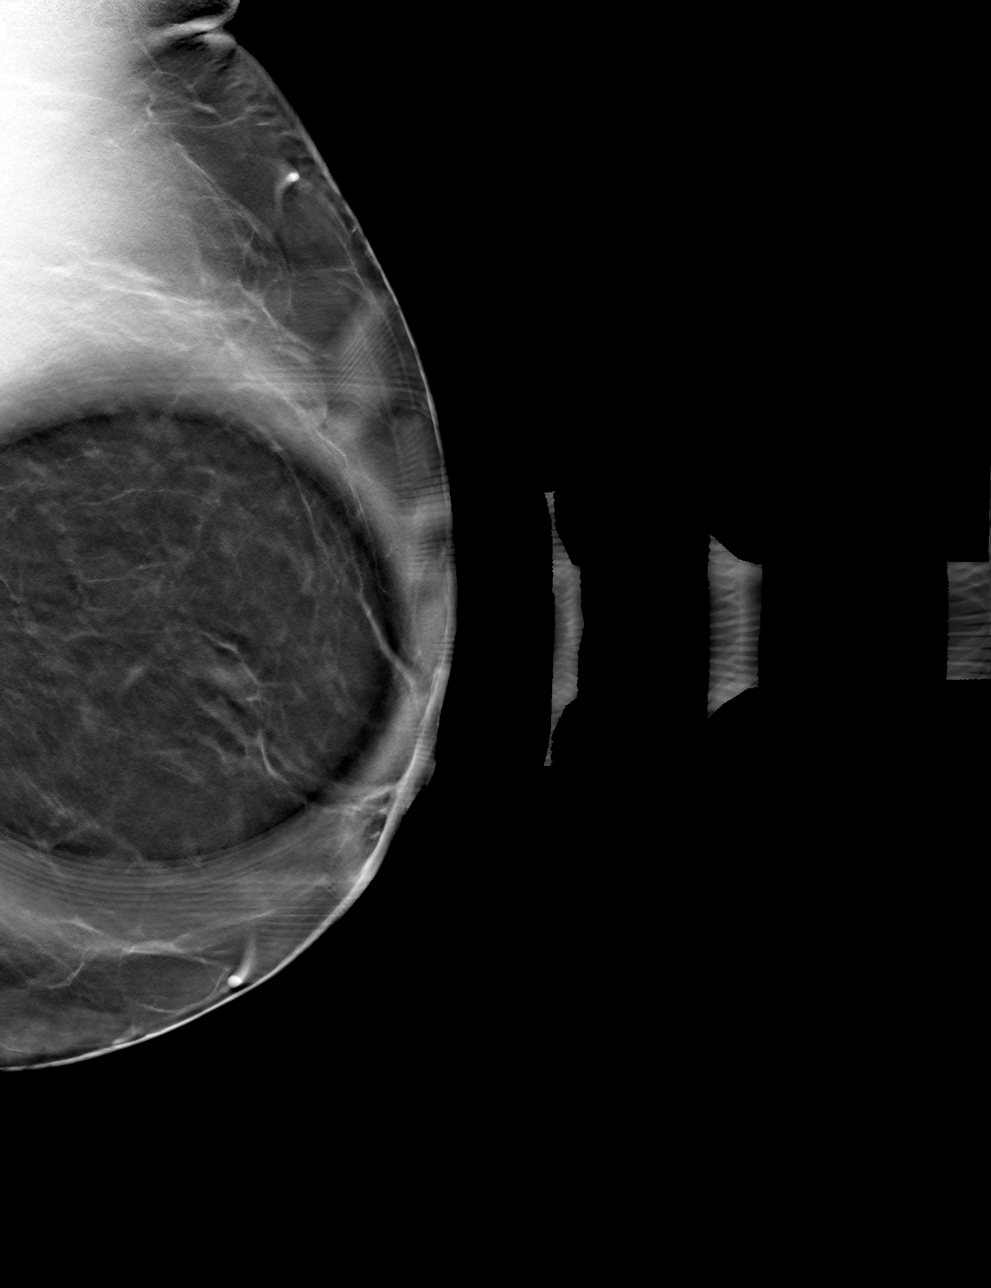

[L CC tomo · tomo slice 29/58.0]
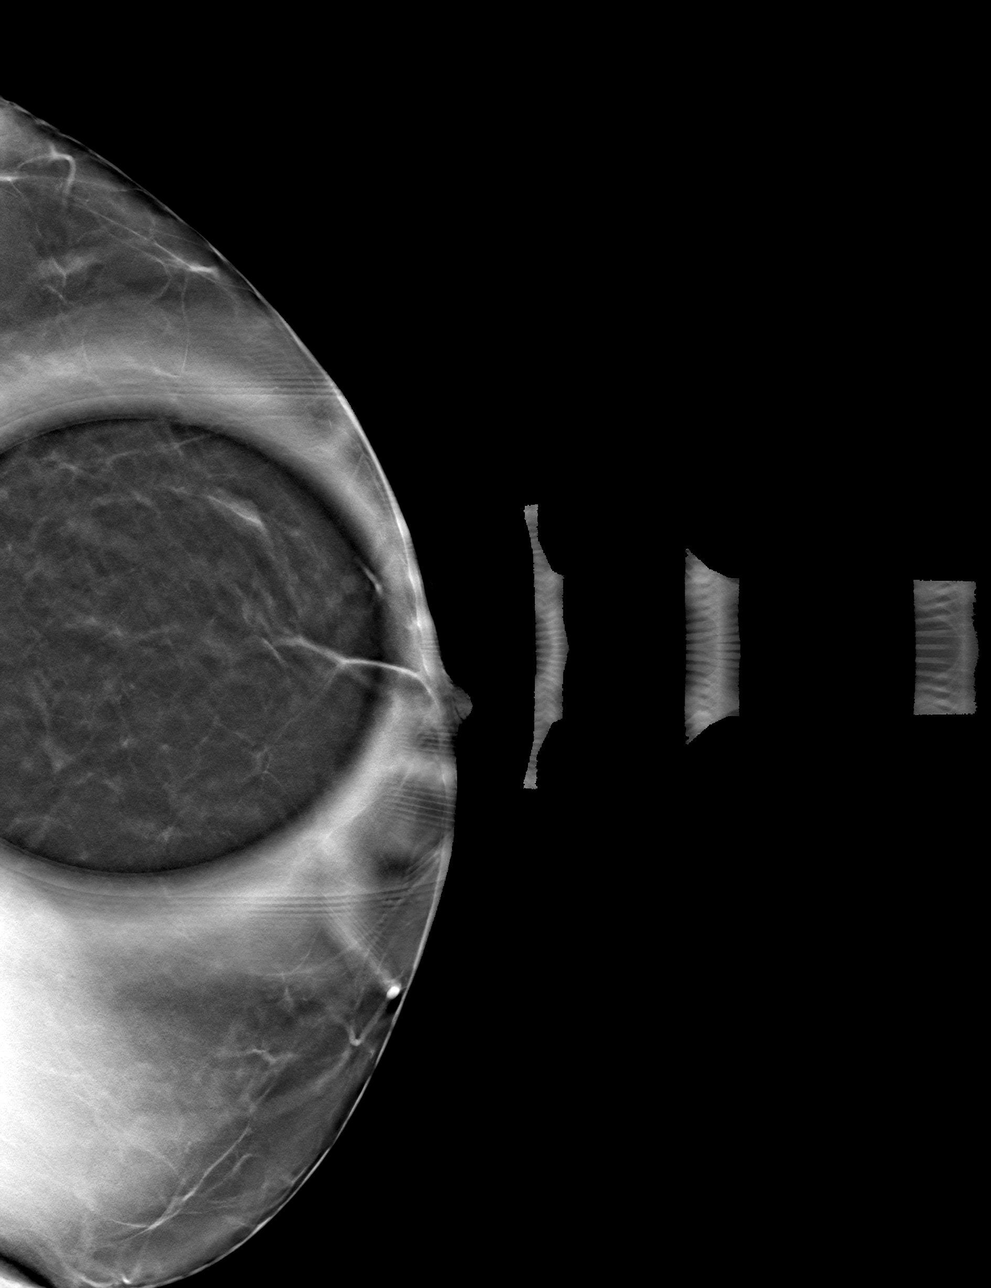

[4 of 12 positions shown; findings below may reference images not displayed]

ACR Breast Density Category b: There are scattered areas of
fibroglandular density.
FINDINGS: Previously described, possible left breast mass persists in the
central lateral aspect at anterior it demonstrates partially
circumscribed and partially obscured borders. Further evaluation
with ultrasound was performed.

Mammographic images were processed with CAD.

Targeted ultrasound is performed, showing an oval, circumscribed
multi-cystic mass at the 4 o'clock position 3 cm from the nipple. It
measures 13 x 8 x 4 mm. There is no associated vascularity. This
correlates well with the mammographic finding and is most consistent
a cluster of cysts.
IMPRESSION: Probably benign, probable left breast cyst cluster corresponding
with the screening mammographic findings. Recommendation is for
short-term imaging.

RECOMMENDATION:
Diagnostic left breast mammogram ultrasound in 6 months.

I have discussed the findings and recommendations with the patient.
Results were also provided in writing at the conclusion of the
visit. If applicable, a reminder letter will be sent to the patient
regarding the next appointment.

BI-RADS CATEGORY  3: Probably benign.

## 2019-09-01 ENCOUNTER — Other Ambulatory Visit: Payer: Self-pay | Admitting: Physician Assistant

## 2019-09-07 ENCOUNTER — Other Ambulatory Visit: Payer: Self-pay | Admitting: Physician Assistant

## 2019-10-06 ENCOUNTER — Other Ambulatory Visit: Payer: Self-pay | Admitting: Physician Assistant

## 2019-10-21 ENCOUNTER — Other Ambulatory Visit: Payer: Self-pay | Admitting: Physician Assistant

## 2019-10-27 ENCOUNTER — Ambulatory Visit: Payer: Medicaid Other | Admitting: Physician Assistant

## 2019-10-27 ENCOUNTER — Encounter: Payer: Self-pay | Admitting: Physician Assistant

## 2019-10-27 VITALS — BP 112/78 | HR 69

## 2019-10-27 DIAGNOSIS — I10 Essential (primary) hypertension: Secondary | ICD-10-CM

## 2019-10-27 DIAGNOSIS — Z1239 Encounter for other screening for malignant neoplasm of breast: Secondary | ICD-10-CM

## 2019-10-27 DIAGNOSIS — E039 Hypothyroidism, unspecified: Secondary | ICD-10-CM

## 2019-10-27 NOTE — Progress Notes (Signed)
   There were no vitals taken for this visit.   Subjective:    Patient ID: Margaret Schmitt, female    DOB: 1961-04-16, 58 y.o.   MRN: [id number]  HPI: Margaret Schmitt is a 58 y.o. female presenting on 10/27/2019 for No chief complaint on file.   HPI  This is a telemedicine appointment through [employer] due to coronavirus pandemic.  I connected with  Margaret Schmitt on 10/27/19 by a video enabled telemedicine application and verified that I am speaking with the correct person using two identifiers.   I discussed the limitations of evaluation and management by telemedicine. The patient expressed understanding and agreed to proceed.  Pt is at home.  Provider is at office.     Pt says she Just started working- part-time.  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  She says she is doing well and she has no complaints   Pt did not get labs drawn in July and she cancelled that July appointment and no labs have been done since January 2020.     Pt has no complaints today.      Relevant past medical, surgical, family and social history reviewed and updated as indicated. Interim medical history since our last visit reviewed. Allergies and medications reviewed and updated.   Current Outpatient Medications:  .  EUTHYROX 175 MCG tablet, TAKE 1 TABLET BY MOUTH ONCE DAILY BEFORE BREAKFAST. PATIENT NEEDS APPOINTMENT, Disp: 14 tablet, Rfl: 0 .  ibuprofen (ADVIL,MOTRIN) 800 MG tablet, Take 1 tablet (800 mg total) by mouth every 8 (eight) hours., Disp: 30 tablet, Rfl: 1 .  metoprolol tartrate (LOPRESSOR) 50 MG tablet, Take 1 tablet by mouth twice daily, Disp: 60 tablet, Rfl: 0    Review of Systems  Per HPI unless specifically indicated above     Objective:    There were no vitals taken for this visit.  Wt Readings from Last 3 Encounters:  11/14/18 268 lb 8 oz (121.8 kg)  09/05/18 253 lb (114.8 kg)  08/22/18 246 lb 4 oz (111.7 kg)     Physical Exam Constitutional:      General: She is not in acute distress.    Appearance: Normal appearance. She is obese. She is not ill-appearing.  HENT:     Head: Normocephalic and atraumatic.  Pulmonary:     Effort: Pulmonary effort is normal. No respiratory distress.  Neurological:     Mental Status: She is alert and oriented to person, place, and time.  Psychiatric:        Attention and Perception: Attention normal.        Speech: Speech normal.        Behavior: Behavior is cooperative.             Assessment & Plan:   Encounter Diagnoses  Name Primary?  . Essential hypertension Yes  . Hypothyroidism, unspecified type   . Encounter for screening for malignant neoplasm of breast, unspecified screening modality     -will refer for diagnostic mammogram (was due in Sept- pt n/s July appt) -Pt to get labs drawn.  After reviewing results, will send refills- ([facility]) -pt to follow up in 3 months.  She is to notify office if she gets insurance through her new job

## 2019-10-28 ENCOUNTER — Other Ambulatory Visit (HOSPITAL_COMMUNITY): Payer: Self-pay | Admitting: Physician Assistant

## 2019-10-28 DIAGNOSIS — Z09 Encounter for follow-up examination after completed treatment for conditions other than malignant neoplasm: Secondary | ICD-10-CM

## 2019-11-11 ENCOUNTER — Other Ambulatory Visit: Payer: Self-pay

## 2019-11-11 ENCOUNTER — Ambulatory Visit (HOSPITAL_COMMUNITY): Payer: Self-pay

## 2019-11-11 ENCOUNTER — Other Ambulatory Visit (HOSPITAL_COMMUNITY): Payer: Self-pay

## 2019-11-11 ENCOUNTER — Other Ambulatory Visit (HOSPITAL_COMMUNITY)
Admission: RE | Admit: 2019-11-11 | Discharge: 2019-11-11 | Disposition: A | Discharge status: Home / Self Care | Payer: Medicaid Other | Source: Ambulatory Visit | Attending: Physician Assistant | Admitting: Physician Assistant

## 2019-11-11 ENCOUNTER — Ambulatory Visit (HOSPITAL_COMMUNITY): Admission: RE | Admit: 2019-11-11 | Payer: Self-pay | Source: Ambulatory Visit

## 2019-11-11 ENCOUNTER — Encounter (HOSPITAL_COMMUNITY): Payer: Self-pay

## 2019-11-11 DIAGNOSIS — E039 Hypothyroidism, unspecified: Secondary | ICD-10-CM

## 2019-11-11 DIAGNOSIS — I10 Essential (primary) hypertension: Secondary | ICD-10-CM

## 2019-11-11 LAB — BASIC METABOLIC PANEL
Anion gap: 9 (ref 5–15)
BUN: 20 mg/dL (ref 6–20)
CO2: 23 mmol/L (ref 22–32)
Calcium: 9.1 mg/dL (ref 8.9–10.3)
Chloride: 105 mmol/L (ref 98–111)
Creatinine, Ser: 1 mg/dL (ref 0.44–1.00)
GFR calc Af Amer: >60 mL/min (ref >60)
GFR calc non Af Amer: >60 mL/min (ref >60)
Glucose, Bld: 100 mg/dL — ABNORMAL HIGH (ref 70–99)
Potassium: 3.7 mmol/L (ref 3.5–5.1)
Sodium: 137 mmol/L (ref 135–145)

## 2019-11-11 LAB — TSH: TSH: 2.805 u[IU]/mL (ref 0.350–4.500)

## 2019-11-12 ENCOUNTER — Other Ambulatory Visit: Payer: Self-pay | Admitting: Physician Assistant

## 2019-11-23 IMAGING — US US BREAST*L* LIMITED INC AXILLA
1 series · 2 of 2 positions shown · non-contrast
Comparison: Previous exam(s).

CLINICAL DATA: 57-year-old female recalled from new baseline
screening mammogram dated 12/18/2018 for a possible left breast
mass.

EXAM:
DIGITAL DIAGNOSTIC LEFT MAMMOGRAM WITH CAD AND TOMO
ULTRASOUND LEFT BREAST

[Series 1: us breast*left* limited inc axilla · 0.07mm/px · 2 of 2 slices shown]
[im 1/2]
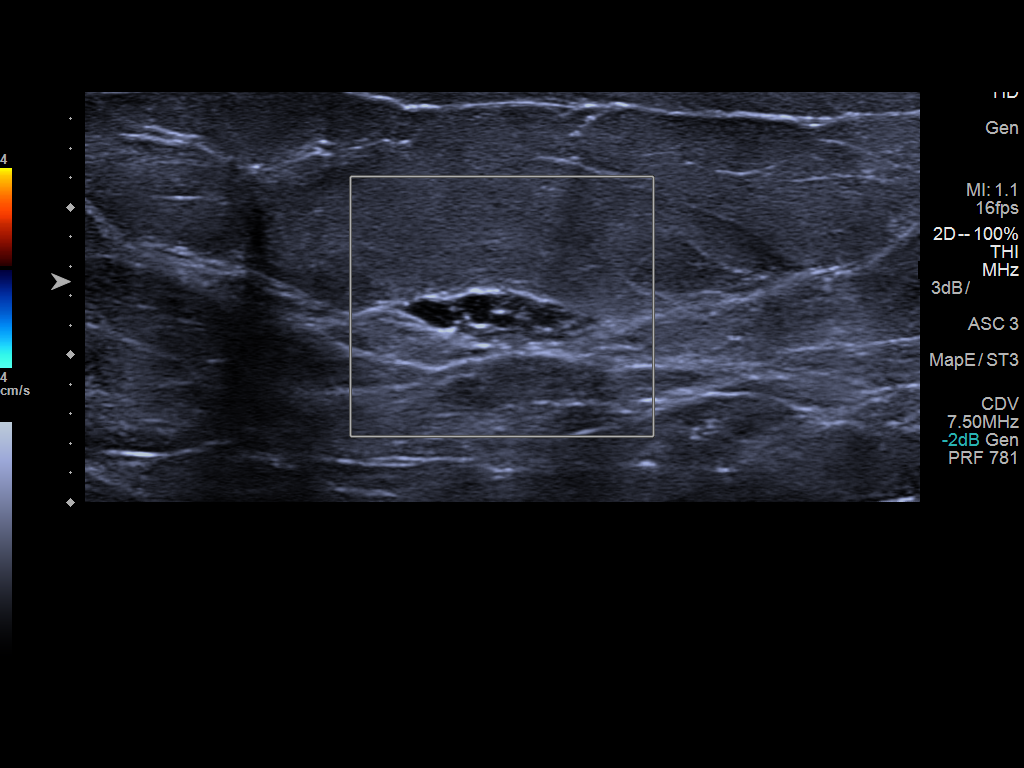
[im 2/2]
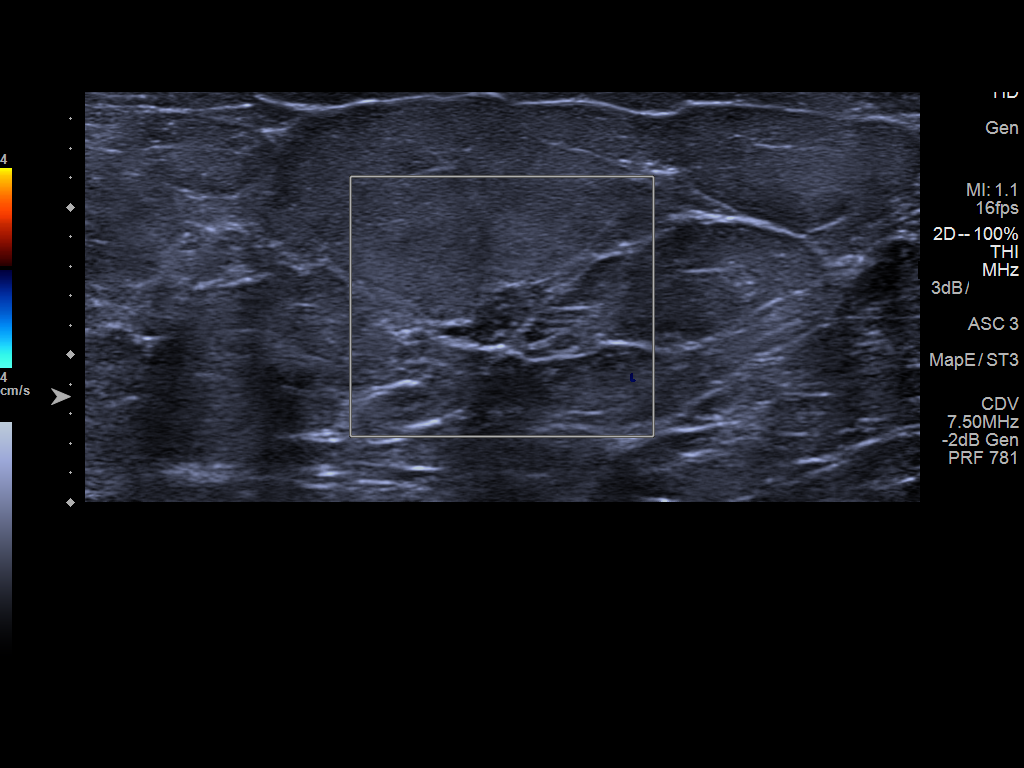

[2 of 2 positions shown; findings below may reference images not displayed]

ACR Breast Density Category b: There are scattered areas of
fibroglandular density.
FINDINGS: Previously described, possible left breast mass persists in the
central lateral aspect at anterior it demonstrates partially
circumscribed and partially obscured borders. Further evaluation
with ultrasound was performed.

Mammographic images were processed with CAD.

Targeted ultrasound is performed, showing an oval, circumscribed
multi-cystic mass at the 4 o'clock position 3 cm from the nipple. It
measures 13 x 8 x 4 mm. There is no associated vascularity. This
correlates well with the mammographic finding and is most consistent
a cluster of cysts.
IMPRESSION: Probably benign, probable left breast cyst cluster corresponding
with the screening mammographic findings. Recommendation is for
short-term imaging.

RECOMMENDATION:
Diagnostic left breast mammogram ultrasound in 6 months.

I have discussed the findings and recommendations with the patient.
Results were also provided in writing at the conclusion of the
visit. If applicable, a reminder letter will be sent to the patient
regarding the next appointment.

BI-RADS CATEGORY  3: Probably benign.

## 2019-11-25 ENCOUNTER — Encounter (HOSPITAL_COMMUNITY): Payer: Self-pay

## 2019-11-25 ENCOUNTER — Ambulatory Visit (HOSPITAL_COMMUNITY): Payer: Self-pay

## 2020-01-26 ENCOUNTER — Ambulatory Visit: Payer: Medicaid Other | Admitting: Physician Assistant

## 2020-04-06 ENCOUNTER — Other Ambulatory Visit: Payer: Self-pay | Admitting: Physician Assistant

## 2020-04-09 ENCOUNTER — Other Ambulatory Visit: Payer: Self-pay | Admitting: Physician Assistant

## 2020-06-04 ENCOUNTER — Ambulatory Visit (INDEPENDENT_AMBULATORY_CARE_PROVIDER_SITE_OTHER): Payer: No Typology Code available for payment source | Admitting: Internal Medicine

## 2020-06-04 ENCOUNTER — Encounter: Payer: Self-pay | Admitting: Internal Medicine

## 2020-06-04 ENCOUNTER — Other Ambulatory Visit: Payer: Self-pay

## 2020-06-04 VITALS — BP 136/86 | HR 81 | Temp 98.0°F | Ht 66.0 in | Wt 284.2 lb

## 2020-06-04 DIAGNOSIS — Z8719 Personal history of other diseases of the digestive system: Secondary | ICD-10-CM | POA: Diagnosis not present

## 2020-06-04 DIAGNOSIS — I10 Essential (primary) hypertension: Secondary | ICD-10-CM | POA: Diagnosis not present

## 2020-06-04 DIAGNOSIS — G4733 Obstructive sleep apnea (adult) (pediatric): Secondary | ICD-10-CM | POA: Insufficient documentation

## 2020-06-04 DIAGNOSIS — L309 Dermatitis, unspecified: Secondary | ICD-10-CM | POA: Insufficient documentation

## 2020-06-04 DIAGNOSIS — E785 Hyperlipidemia, unspecified: Secondary | ICD-10-CM

## 2020-06-04 DIAGNOSIS — E039 Hypothyroidism, unspecified: Secondary | ICD-10-CM

## 2020-06-04 DIAGNOSIS — C73 Malignant neoplasm of thyroid gland: Secondary | ICD-10-CM | POA: Diagnosis not present

## 2020-06-04 MED ORDER — HYDROCHLOROTHIAZIDE 25 MG PO TABS: 25.0000 mg | ORAL_TABLET | Freq: Every day | ORAL | 1 refills | Status: DC

## 2020-06-04 MED ORDER — TRIAMCINOLONE ACETONIDE 0.1 % EX CREA: 1.0000 "application " | TOPICAL_CREAM | Freq: Two times a day (BID) | CUTANEOUS | 0 refills | Status: DC

## 2020-06-04 NOTE — Progress Notes (Signed)
New Patient Office Visit     This visit occurred during the SARS-CoV-2 public health emergency.  Safety protocols were in place, including screening questions prior to the visit, additional usage of staff PPE, and extensive cleaning of exam room while observing appropriate contact time as indicated for disinfecting solutions.    CC/Reason for Visit: Establish care, medication refills, discuss chronic medical conditions Previous PCP: Soyla Dryer, PA Last Visit: Spring 2021  HPI: Margaret Schmitt is a 59 y.o. female who is coming in today for the above mentioned reasons. 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  She works as a Psychologist, counselling in [place], she does not smoke, she does not drink, she has allergies to erythromycin, family history significant for dad and mom with strokes and TIAs.  She has been treated with metoprolol for hypertension but she prefers to go back on hydrochlorothiazide which she had been taking previously as it helped with the swelling in her legs.  She has no acute complaints.  She also has some eczema patch on her anterior shin with right and wonders about treatment for this.   Past Medical/Surgical History: Past Medical History:  Diagnosis Date  . Asthma   . Eczema   . Hypertension   . Hypertension   . Hypothyroidism   . Morbid obesity ([place])   . OSA (obstructive sleep apnea)   . Thyroid cancer ([place])   . Thyroid disease    hypothyroidism    Past Surgical History:  Procedure Laterality Date  . ABDOMINAL HYSTERECTOMY    . ADENOIDECTOMY    . APPENDECTOMY    . CHOLECYSTECTOMY    . KIDNEY SURGERY     left ureter reimplantation  . THYROID LOBECTOMY Left   . TONSILLECTOMY      Social History:  reports that she has quit smoking. Her smoking use included cigarettes. She has never used  smokeless tobacco. She reports current alcohol use. She reports previous drug use.  Allergies: Allergies  Allergen Reactions  . Erythromycin Swelling    Family History:  Family History  Problem Relation Age of Onset  . Hypertension Mother   . Cancer Mother        breast cancer  . Hypertension Father   . Cancer Father        prostate cancer  . Stroke Father   . Stroke Maternal Grandmother      Current Outpatient Medications:  .  ibuprofen (ADVIL,MOTRIN) 800 MG tablet, Take 1 tablet (800 mg total) by mouth every 8 (eight) hours., Disp: 30 tablet, Rfl: 1 .  levothyroxine (SYNTHROID) 175 MCG tablet, Take 1 tablet (175 mcg total) by mouth daily before breakfast., Disp: 30 tablet, Rfl: 3 .  Medium Chain Triglycerides (MCT OIL PO), Take 2,000 mg by mouth daily., Disp: , Rfl:  .  metoprolol tartrate (LOPRESSOR) 50 MG tablet, Take 1 tablet by mouth twice daily, Disp: 60 tablet, Rfl: 3 .  Nutritional Supplements (KETO PO), Take by mouth. Advanced Keto 1500, Disp: , Rfl:  .  triamcinolone (NASACORT ALLERGY 24HR) 55 MCG/ACT AERO nasal inhaler, Place 2 sprays into the nose daily., Disp: , Rfl:  .  hydrochlorothiazide (HYDRODIURIL) 25 MG tablet, Take 1 tablet (25 mg total) by mouth daily., Disp: 90 tablet, Rfl: 1 .  triamcinolone cream (KENALOG) 0.1 %, Apply 1 application topically 2 (two) times daily., Disp: 30 g, Rfl: 0  Review of Systems:  Constitutional: Denies fever, chills, diaphoresis, appetite change and fatigue.  HEENT: Denies photophobia, eye pain, redness, hearing loss, ear pain, congestion, sore throat, rhinorrhea, sneezing, mouth sores, trouble swallowing, neck pain, neck stiffness and tinnitus.   Respiratory: Denies SOB, DOE, cough, chest tightness,  and wheezing.   Cardiovascular: Denies chest pain, palpitations and leg swelling.  Gastrointestinal: Denies nausea, vomiting, abdominal pain, diarrhea, constipation, blood in stool and abdominal distention.  Genitourinary: Denies  dysuria, urgency, frequency, hematuria, flank pain and difficulty urinating.  Endocrine: Denies: hot or cold intolerance, sweats, changes in hair or nails, polyuria, polydipsia. Musculoskeletal: Denies myalgias, back pain, joint swelling, arthralgias and gait problem.  Skin: Denies pallor, rash and wound.  Neurological: Denies dizziness, seizures, syncope, weakness, light-headedness, numbness and headaches.  Hematological: Denies adenopathy. Easy bruising, personal or family bleeding history  Psychiatric/Behavioral: Denies suicidal ideation, mood changes, confusion, nervousness, sleep disturbance and agitation    Physical Exam: Vitals:   06/04/20 1302  BP: 136/86  Pulse: 81  Temp: 98 F (36.7 C)  TempSrc: Oral  SpO2: 98%  Weight: 284 lb 3.2 oz (128.9 kg)  Height: 5\' 6"  (1.676 m)   Body mass index is 45.87 kg/m.  Constitutional: NAD, calm, comfortable, obese Eyes: PERRL, lids and conjunctivae normal ENMT: Mucous membranes are moist.  Respiratory: clear to auscultation bilaterally, no wheezing, no crackles. Normal respiratory effort. No accessory muscle use.  Cardiovascular: Regular rate and rhythm, no murmurs / rubs / gallops. No extremity edema.  Neurologic: Grossly intact and nonfocal Psychiatric: Normal judgment and insight. Alert and oriented x 3. Normal mood.    Impression and Plan:  Essential hypertension  -She will go back on hydrochlorothiazide 25 mg daily and return in 6 weeks for follow-up. -Provided handout on low-sodium diet.  Colon cancer screening - Plan: Ambulatory referral to Gastroenterology  Hypothyroidism, unspecified type Thyroid cancer ([place]) -Check TSH when she returns for labs, continue current Synthroid dose.  Eczema, unspecified type  - Plan: triamcinolone cream (KENALOG) 0.1 %  Morbid obesity ([facility]) -Discussed healthy lifestyle, including increased physical activity and better food choices to promote weight loss.    Patient Instructions   -Nice seeing you today!!  -Start HCTZ 25 mg daily.  -Low salt diet (see below).  -Schedule follow up in 6 weeks. Please come in fasting for lab work.   DASH Eating Plan DASH stands for "Dietary Approaches to Stop Hypertension." The DASH eating plan is a healthy eating plan that has been shown to reduce high blood pressure (hypertension). It may also reduce your risk for type 2 diabetes, heart disease, and stroke. The DASH eating plan may also help with weight loss. What are tips for following this plan?  General guidelines  Avoid eating more than 2,300 mg (milligrams) of salt (sodium) a day. If you have hypertension, you may need to reduce your sodium intake to 1,500 mg a day.  Limit alcohol intake to no more than 1 drink a day for nonpregnant women and 2 drinks a day for men. One drink equals 12 oz of beer, 5 oz of wine, or 1 oz of hard liquor.  Work with your health care provider to maintain a healthy body weight or to lose weight. Ask what an ideal weight is for you.  Get at least 30 minutes of exercise that causes your heart to beat faster (aerobic exercise) most days of the week. Activities may include walking, swimming, or biking.  Work with your health care provider or diet and  nutrition specialist (dietitian) to adjust your eating plan to your individual calorie needs. Reading food labels   Check food labels for the amount of sodium per serving. Choose foods with less than 5 percent of the Daily Value of sodium. Generally, foods with less than 300 mg of sodium per serving fit into this eating plan.  To find whole grains, look for the word "whole" as the first word in the ingredient list. Shopping  Buy products labeled as "low-sodium" or "no salt added."  Buy fresh foods. Avoid canned foods and premade or frozen meals. Cooking  Avoid adding salt when cooking. Use salt-free seasonings or herbs instead of table salt or sea salt. Check with your health care provider or  pharmacist before using salt substitutes.  Do not fry foods. Cook foods using healthy methods such as baking, boiling, grilling, and broiling instead.  Cook with heart-healthy oils, such as olive, canola, soybean, or sunflower oil. Meal planning  Eat a balanced diet that includes: ? 5 or more servings of fruits and vegetables each day. At each meal, try to fill half of your plate with fruits and vegetables. ? Up to 6-8 servings of whole grains each day. ? Less than 6 oz of lean meat, poultry, or fish each day. A 3-oz serving of meat is about the same size as a deck of cards. One egg equals 1 oz. ? 2 servings of low-fat dairy each day. ? A serving of nuts, seeds, or beans 5 times each week. ? Heart-healthy fats. Healthy fats called Omega-3 fatty acids are found in foods such as flaxseeds and coldwater fish, like sardines, salmon, and mackerel.  Limit how much you eat of the following: ? Canned or prepackaged foods. ? Food that is high in trans fat, such as fried foods. ? Food that is high in saturated fat, such as fatty meat. ? Sweets, desserts, sugary drinks, and other foods with added sugar. ? Full-fat dairy products.  Do not salt foods before eating.  Try to eat at least 2 vegetarian meals each week.  Eat more home-cooked food and less restaurant, buffet, and fast food.  When eating at a restaurant, ask that your food be prepared with less salt or no salt, if possible. What foods are recommended? The items listed may not be a complete list. Talk with your dietitian about what dietary choices are best for you. Grains Whole-grain or whole-wheat bread. Whole-grain or whole-wheat pasta. Brown rice. [place]. Bulgur. Whole-grain and low-sodium cereals. Pita bread. Low-fat, low-sodium crackers. Whole-wheat flour tortillas. Vegetables Fresh or frozen vegetables (raw, steamed, roasted, or grilled). Low-sodium or reduced-sodium tomato and vegetable juice. Low-sodium or  reduced-sodium tomato sauce and tomato paste. Low-sodium or reduced-sodium canned vegetables. Fruits All fresh, dried, or frozen fruit. Canned fruit in natural juice (without added sugar). Meat and other protein foods Skinless chicken or [place]. Ground chicken or [place]. Pork with fat trimmed off. Fish and seafood. Egg whites. Dried beans, peas, or lentils. Unsalted nuts, nut butters, and seeds. Unsalted canned beans. Lean cuts of beef with fat trimmed off. Low-sodium, lean deli meat. Dairy Low-fat (1%) or fat-free (skim) milk. Fat-free, low-fat, or reduced-fat cheeses. Nonfat, low-sodium ricotta or cottage cheese. Low-fat or nonfat yogurt. Low-fat, low-sodium cheese. Fats and oils Soft margarine without trans fats. Vegetable oil. Low-fat, reduced-fat, or light mayonnaise and salad dressings (reduced-sodium). Canola, safflower, olive, soybean, and sunflower oils. Avocado. Seasoning and other foods Herbs. Spices. Seasoning mixes without salt. Unsalted popcorn and pretzels. Fat-free sweets. What  foods are not recommended? The items listed may not be a complete list. Talk with your dietitian about what dietary choices are best for you. Grains Baked goods made with fat, such as croissants, muffins, or some breads. Dry pasta or rice meal packs. Vegetables Creamed or fried vegetables. Vegetables in a cheese sauce. Regular canned vegetables (not low-sodium or reduced-sodium). Regular canned tomato sauce and paste (not low-sodium or reduced-sodium). Regular tomato and vegetable juice (not low-sodium or reduced-sodium). Angie Fava. Olives. Fruits Canned fruit in a light or heavy syrup. Fried fruit. Fruit in cream or butter sauce. Meat and other protein foods Fatty cuts of meat. Ribs. Fried meat. Berniece Salines. Sausage. Bologna and other processed lunch meats. Salami. Fatback. Hotdogs. Bratwurst. Salted nuts and seeds. Canned beans with added salt. Canned or smoked fish. Whole eggs or egg yolks. Chicken or [place]  with skin. Dairy Whole or 2% milk, cream, and half-and-half. Whole or full-fat cream cheese. Whole-fat or sweetened yogurt. Full-fat cheese. Nondairy creamers. Whipped toppings. Processed cheese and cheese spreads. Fats and oils Butter. Stick margarine. Lard. Shortening. Ghee. Bacon fat. Tropical oils, such as coconut, palm kernel, or palm oil. Seasoning and other foods Salted popcorn and pretzels. Onion salt, garlic salt, seasoned salt, table salt, and sea salt. Worcestershire sauce. Tartar sauce. Barbecue sauce. Teriyaki sauce. Soy sauce, including reduced-sodium. Steak sauce. Canned and packaged gravies. Fish sauce. Oyster sauce. Cocktail sauce. Horseradish that you find on the shelf. Ketchup. Mustard. Meat flavorings and tenderizers. Bouillon cubes. Hot sauce and Tabasco sauce. Premade or packaged marinades. Premade or packaged taco seasonings. Relishes. Regular salad dressings. Where to find more information:  National Heart, Lung, and [place]: https://wilson-eaton.com/  American Heart Association: www.heart.org Summary  The DASH eating plan is a healthy eating plan that has been shown to reduce high blood pressure (hypertension). It may also reduce your risk for type 2 diabetes, heart disease, and stroke.  With the DASH eating plan, you should limit salt (sodium) intake to 2,300 mg a day. If you have hypertension, you may need to reduce your sodium intake to 1,500 mg a day.  When on the DASH eating plan, aim to eat more fresh fruits and vegetables, whole grains, lean proteins, low-fat dairy, and heart-healthy fats.  Work with your health care provider or diet and nutrition specialist (dietitian) to adjust your eating plan to your individual calorie needs. This information is not intended to replace advice given to you by your health care provider. Make sure you discuss any questions you have with your health care provider. Document Revised: 09/28/2017 Document Reviewed:  10/09/2016 Elsevier Patient Education  2020 [place], MD [facility]

## 2020-06-04 NOTE — Patient Instructions (Signed)
-Nice seeing you today!!  -Start HCTZ 25 mg daily.  -Low salt diet (see below).  -Schedule follow up in 6 weeks. Please come in fasting for lab work.   DASH Eating Plan DASH stands for "Dietary Approaches to Stop Hypertension." The DASH eating plan is a healthy eating plan that has been shown to reduce high blood pressure (hypertension). It may also reduce your risk for type 2 diabetes, heart disease, and stroke. The DASH eating plan may also help with weight loss. What are tips for following this plan?  General guidelines  Avoid eating more than 2,300 mg (milligrams) of salt (sodium) a day. If you have hypertension, you may need to reduce your sodium intake to 1,500 mg a day.  Limit alcohol intake to no more than 1 drink a day for nonpregnant women and 2 drinks a day for men. One drink equals 12 oz of beer, 5 oz of wine, or 1 oz of hard liquor.  Work with your health care provider to maintain a healthy body weight or to lose weight. Ask what an ideal weight is for you.  Get at least 30 minutes of exercise that causes your heart to beat faster (aerobic exercise) most days of the week. Activities may include walking, swimming, or biking.  Work with your health care provider or diet and nutrition specialist (dietitian) to adjust your eating plan to your individual calorie needs. Reading food labels   Check food labels for the amount of sodium per serving. Choose foods with less than 5 percent of the Daily Value of sodium. Generally, foods with less than 300 mg of sodium per serving fit into this eating plan.  To find whole grains, look for the word "whole" as the first word in the ingredient list. Shopping  Buy products labeled as "low-sodium" or "no salt added."  Buy fresh foods. Avoid canned foods and premade or frozen meals. Cooking  Avoid adding salt when cooking. Use salt-free seasonings or herbs instead of table salt or sea salt. Check with your health care provider or  pharmacist before using salt substitutes.  Do not fry foods. Cook foods using healthy methods such as baking, boiling, grilling, and broiling instead.  Cook with heart-healthy oils, such as olive, canola, soybean, or sunflower oil. Meal planning  Eat a balanced diet that includes: ? 5 or more servings of fruits and vegetables each day. At each meal, try to fill half of your plate with fruits and vegetables. ? Up to 6-8 servings of whole grains each day. ? Less than 6 oz of lean meat, poultry, or fish each day. A 3-oz serving of meat is about the same size as a deck of cards. One egg equals 1 oz. ? 2 servings of low-fat dairy each day. ? A serving of nuts, seeds, or beans 5 times each week. ? Heart-healthy fats. Healthy fats called Omega-3 fatty acids are found in foods such as flaxseeds and coldwater fish, like sardines, salmon, and mackerel.  Limit how much you eat of the following: ? Canned or prepackaged foods. ? Food that is high in trans fat, such as fried foods. ? Food that is high in saturated fat, such as fatty meat. ? Sweets, desserts, sugary drinks, and other foods with added sugar. ? Full-fat dairy products.  Do not salt foods before eating.  Try to eat at least 2 vegetarian meals each week.  Eat more home-cooked food and less restaurant, buffet, and fast food.  When eating at a restaurant,  ask that your food be prepared with less salt or no salt, if possible. What foods are recommended? The items listed may not be a complete list. Talk with your dietitian about what dietary choices are best for you. Grains Whole-grain or whole-wheat bread. Whole-grain or whole-wheat pasta. Brown rice. [place]. Bulgur. Whole-grain and low-sodium cereals. Pita bread. Low-fat, low-sodium crackers. Whole-wheat flour tortillas. Vegetables Fresh or frozen vegetables (raw, steamed, roasted, or grilled). Low-sodium or reduced-sodium tomato and vegetable juice. Low-sodium or  reduced-sodium tomato sauce and tomato paste. Low-sodium or reduced-sodium canned vegetables. Fruits All fresh, dried, or frozen fruit. Canned fruit in natural juice (without added sugar). Meat and other protein foods Skinless chicken or [place]. Ground chicken or [place]. Pork with fat trimmed off. Fish and seafood. Egg whites. Dried beans, peas, or lentils. Unsalted nuts, nut butters, and seeds. Unsalted canned beans. Lean cuts of beef with fat trimmed off. Low-sodium, lean deli meat. Dairy Low-fat (1%) or fat-free (skim) milk. Fat-free, low-fat, or reduced-fat cheeses. Nonfat, low-sodium ricotta or cottage cheese. Low-fat or nonfat yogurt. Low-fat, low-sodium cheese. Fats and oils Soft margarine without trans fats. Vegetable oil. Low-fat, reduced-fat, or light mayonnaise and salad dressings (reduced-sodium). Canola, safflower, olive, soybean, and sunflower oils. Avocado. Seasoning and other foods Herbs. Spices. Seasoning mixes without salt. Unsalted popcorn and pretzels. Fat-free sweets. What foods are not recommended? The items listed may not be a complete list. Talk with your dietitian about what dietary choices are best for you. Grains Baked goods made with fat, such as croissants, muffins, or some breads. Dry pasta or rice meal packs. Vegetables Creamed or fried vegetables. Vegetables in a cheese sauce. Regular canned vegetables (not low-sodium or reduced-sodium). Regular canned tomato sauce and paste (not low-sodium or reduced-sodium). Regular tomato and vegetable juice (not low-sodium or reduced-sodium). Angie Fava. Olives. Fruits Canned fruit in a light or heavy syrup. Fried fruit. Fruit in cream or butter sauce. Meat and other protein foods Fatty cuts of meat. Ribs. Fried meat. Berniece Salines. Sausage. Bologna and other processed lunch meats. Salami. Fatback. Hotdogs. Bratwurst. Salted nuts and seeds. Canned beans with added salt. Canned or smoked fish. Whole eggs or egg yolks. Chicken or [place]  with skin. Dairy Whole or 2% milk, cream, and half-and-half. Whole or full-fat cream cheese. Whole-fat or sweetened yogurt. Full-fat cheese. Nondairy creamers. Whipped toppings. Processed cheese and cheese spreads. Fats and oils Butter. Stick margarine. Lard. Shortening. Ghee. Bacon fat. Tropical oils, such as coconut, palm kernel, or palm oil. Seasoning and other foods Salted popcorn and pretzels. Onion salt, garlic salt, seasoned salt, table salt, and sea salt. Worcestershire sauce. Tartar sauce. Barbecue sauce. Teriyaki sauce. Soy sauce, including reduced-sodium. Steak sauce. Canned and packaged gravies. Fish sauce. Oyster sauce. Cocktail sauce. Horseradish that you find on the shelf. Ketchup. Mustard. Meat flavorings and tenderizers. Bouillon cubes. Hot sauce and Tabasco sauce. Premade or packaged marinades. Premade or packaged taco seasonings. Relishes. Regular salad dressings. Where to find more information:  National Heart, Lung, and [place]: https://wilson-eaton.com/  American Heart Association: www.heart.org Summary  The DASH eating plan is a healthy eating plan that has been shown to reduce high blood pressure (hypertension). It may also reduce your risk for type 2 diabetes, heart disease, and stroke.  With the DASH eating plan, you should limit salt (sodium) intake to 2,300 mg a day. If you have hypertension, you may need to reduce your sodium intake to 1,500 mg a day.  When on the DASH eating plan, aim to eat more fresh  fruits and vegetables, whole grains, lean proteins, low-fat dairy, and heart-healthy fats.  Work with your health care provider or diet and nutrition specialist (dietitian) to adjust your eating plan to your individual calorie needs. This information is not intended to replace advice given to you by your health care provider. Make sure you discuss any questions you have with your health care provider. Document Revised: 09/28/2017 Document Reviewed:  10/09/2016 Elsevier Patient Education  2020 [employer].

## 2020-06-14 LAB — HM MAMMOGRAPHY

## 2020-06-18 ENCOUNTER — Telehealth: Payer: Self-pay | Admitting: Internal Medicine

## 2020-06-18 MED ORDER — LEVOTHYROXINE SODIUM 175 MCG PO TABS: 175.0000 ug | ORAL_TABLET | Freq: Every day | ORAL | 0 refills | Status: DC

## 2020-06-18 NOTE — Telephone Encounter (Signed)
Pt is calling to say she need medication levothyroxine (SYNTHROID) 175 MCG tablet    Filled at the   [facility] [address], [place] [address] [place], [place] [address]  Phone:  [phone] Fax:  [phone]   Please advise

## 2020-06-29 ENCOUNTER — Encounter: Payer: Self-pay | Admitting: Internal Medicine

## 2020-07-02 ENCOUNTER — Telehealth: Payer: Self-pay | Admitting: Internal Medicine

## 2020-07-02 NOTE — Telephone Encounter (Signed)
Pt is wanting a prescription for her asthma. She is having flare ups  She is wanting albuterol for it     [place] [place], [place] [address]  Phone:  [phone] Fax:  [phone]

## 2020-07-02 NOTE — Telephone Encounter (Signed)
Patient has scheduled an appointment 

## 2020-07-07 ENCOUNTER — Encounter: Payer: No Typology Code available for payment source | Admitting: Internal Medicine

## 2020-07-16 ENCOUNTER — Other Ambulatory Visit: Payer: Self-pay

## 2020-07-16 ENCOUNTER — Encounter: Payer: Self-pay | Admitting: Internal Medicine

## 2020-07-16 ENCOUNTER — Ambulatory Visit (INDEPENDENT_AMBULATORY_CARE_PROVIDER_SITE_OTHER): Payer: No Typology Code available for payment source | Admitting: Internal Medicine

## 2020-07-16 VITALS — BP 120/80 | HR 83 | Temp 98.4°F | Wt 284.2 lb

## 2020-07-16 DIAGNOSIS — C73 Malignant neoplasm of thyroid gland: Secondary | ICD-10-CM

## 2020-07-16 DIAGNOSIS — I10 Essential (primary) hypertension: Secondary | ICD-10-CM

## 2020-07-16 DIAGNOSIS — G4733 Obstructive sleep apnea (adult) (pediatric): Secondary | ICD-10-CM

## 2020-07-16 DIAGNOSIS — E039 Hypothyroidism, unspecified: Secondary | ICD-10-CM | POA: Diagnosis not present

## 2020-07-16 MED ORDER — ALBUTEROL SULFATE HFA 108 (90 BASE) MCG/ACT IN AERS: 2.0000 | INHALATION_SPRAY | Freq: Four times a day (QID) | RESPIRATORY_TRACT | 2 refills | Status: DC | PRN

## 2020-07-16 NOTE — Progress Notes (Addendum)
Established Patient Office Visit     This visit occurred during the SARS-CoV-2 public health emergency.  Safety protocols were in place, including screening questions prior to the visit, additional usage of staff PPE, and extensive cleaning of exam room while observing appropriate contact time as indicated for disinfecting solutions.    CC/Reason for Visit: 6-week follow-up blood pressure and follow-up chronic medical conditions  HPI: Margaret Schmitt is a 59 y.o. female who is coming in today for the above mentioned reasons. Past Medical History is significant for:  Morbid obesity, hypertension, hypothyroidism status post thyroidectomy for follicular carcinoma, hyperlipidemia not on medications, obstructive sleep apnea not on CPAP.  She is here mainly today to follow-up blood pressure as at last visit metoprolol was switched over to hydrochlorothiazide per her request.  She has been tolerating medication well.  She has been trying to eat low-salt.  She is requesting lab work today.  She tells me she is overdue for her yearly thyroid ultrasound that she has due to her history of follicular cancer.  She was called by GI for screening colonoscopy and has to schedule the appointment.  She is not interested in any vaccinations today.   Past Medical/Surgical History: Past Medical History:  Diagnosis Date  . Asthma   . Eczema   . Hypertension   . Hypertension   . Hypothyroidism   . Morbid obesity ([place])   . OSA (obstructive sleep apnea)   . Thyroid cancer ([place])   . Thyroid disease    hypothyroidism    Past Surgical History:  Procedure Laterality Date  . ABDOMINAL HYSTERECTOMY    . ADENOIDECTOMY    . APPENDECTOMY    . CHOLECYSTECTOMY    . KIDNEY SURGERY     left ureter reimplantation  . THYROID LOBECTOMY Left   . TONSILLECTOMY      Social History:  reports that she has quit smoking. Her smoking use included cigarettes. She has never used smokeless tobacco. She reports current  alcohol use. She reports previous drug use.  Allergies: Allergies  Allergen Reactions  . Erythromycin Swelling    Family History:  Family History  Problem Relation Age of Onset  . Hypertension Mother   . Cancer Mother        breast cancer  . Hypertension Father   . Cancer Father        prostate cancer  . Stroke Father   . Stroke Maternal Grandmother      Current Outpatient Medications:  .  albuterol (VENTOLIN HFA) 108 (90 Base) MCG/ACT inhaler, Inhale 2 puffs into the lungs every 6 (six) hours as needed for wheezing or shortness of breath., Disp: 18 g, Rfl: 2 .  hydrochlorothiazide (HYDRODIURIL) 25 MG tablet, Take 1 tablet (25 mg total) by mouth daily., Disp: 90 tablet, Rfl: 1 .  ibuprofen (ADVIL,MOTRIN) 800 MG tablet, Take 1 tablet (800 mg total) by mouth every 8 (eight) hours., Disp: 30 tablet, Rfl: 1 .  Medium Chain Triglycerides (MCT OIL PO), Take 2,000 mg by mouth daily., Disp: , Rfl:  .  Nutritional Supplements (KETO PO), Take by mouth. Advanced Keto 1500, Disp: , Rfl:  .  triamcinolone (NASACORT ALLERGY 24HR) 55 MCG/ACT AERO nasal inhaler, Place 2 sprays into the nose daily., Disp: , Rfl:  .  triamcinolone cream (KENALOG) 0.1 %, Apply 1 application topically 2 (two) times daily., Disp: 30 g, Rfl: 0 .  EUTHYROX 175 MCG tablet, TAKE 1 TABLET BY MOUTH ONCE DAILY BEFORE BREAKFAST,  Disp: 90 tablet, Rfl: 1  Review of Systems:  Constitutional: Denies fever, chills, diaphoresis, appetite change and fatigue.  HEENT: Denies photophobia, eye pain, redness, hearing loss, ear pain, congestion, sore throat, rhinorrhea, sneezing, mouth sores, trouble swallowing, neck pain, neck stiffness and tinnitus.   Respiratory: Denies SOB, DOE, cough, chest tightness,  and wheezing.   Cardiovascular: Denies chest pain, palpitations and leg swelling.  Gastrointestinal: Denies nausea, vomiting, abdominal pain, diarrhea, constipation, blood in stool and abdominal distention.  Genitourinary: Denies  dysuria, urgency, frequency, hematuria, flank pain and difficulty urinating.  Endocrine: Denies: hot or cold intolerance, sweats, changes in hair or nails, polyuria, polydipsia. Musculoskeletal: Denies myalgias, back pain, joint swelling, arthralgias and gait problem.  Skin: Denies pallor, rash and wound.  Neurological: Denies dizziness, seizures, syncope, weakness, light-headedness, numbness and headaches.  Hematological: Denies adenopathy. Easy bruising, personal or family bleeding history  Psychiatric/Behavioral: Denies suicidal ideation, mood changes, confusion, nervousness, sleep disturbance and agitation    Physical Exam: Vitals:   07/16/20 0948  BP: 120/80  Pulse: 83  Temp: 98.4 F (36.9 C)  TempSrc: Oral  SpO2: 97%  Weight: 284 lb 3.2 oz (128.9 kg)    Body mass index is 45.87 kg/m.   Constitutional: NAD, calm, comfortable Eyes: PERRL, lids and conjunctivae normal ENMT: Mucous membranes are moist.  Respiratory: clear to auscultation bilaterally, no wheezing, no crackles. Normal respiratory effort. No accessory muscle use.  Cardiovascular: Regular rate and rhythm, no murmurs / rubs / gallops.  Trace bilateral lower extremity edema. Neurologic: Grossly intact and nonfocal Psychiatric: Normal judgment and insight. Alert and oriented x 3. Normal mood.    Impression and Plan:  OSA (obstructive sleep apnea)  - Plan: Ambulatory referral to Neurology, albuterol (VENTOLIN HFA) 108 (90 Base) MCG/ACT inhaler  Thyroid cancer ([place])  - Plan: TSH -Also arrange for her yearly ultrasound.  Hypothyroidism, unspecified type  - Plan: TSH  Essential hypertension -Well-controlled on hydrochlorothiazide 25 mg daily.  Morbid obesity ([place]) -Discussed healthy lifestyle, including increased physical activity and better food choices to promote weight loss.   Patient Instructions  -Nice seeing you today!!  -Lab work today; will notify you once results are available.  -Schedule  follow up in 6 months.     Lelon Frohlich, MD [facility]

## 2020-07-16 NOTE — Patient Instructions (Signed)
-  Nice seeing you today!!  -Lab work today; will notify you once results are available.  -Schedule follow up in 6 months. 

## 2020-07-17 LAB — CBC WITH DIFFERENTIAL/PLATELET
Absolute Monocytes: 657 cells/uL (ref 200–950)
Basophils Absolute: 162 cells/uL (ref 0–200)
Basophils Relative: 1.6 %
Eosinophils Absolute: 465 cells/uL (ref 15–500)
Eosinophils Relative: 4.6 %
HCT: 41.2 % (ref 35.0–45.0)
Hemoglobin: 13.6 g/dL (ref 11.7–15.5)
Lymphs Abs: 2333 cells/uL (ref 850–3900)
MCH: 26.7 pg — ABNORMAL LOW (ref 27.0–33.0)
MCHC: 33 g/dL (ref 32.0–36.0)
MCV: 80.8 fL (ref 80.0–100.0)
MPV: 10.7 fL (ref 7.5–12.5)
Monocytes Relative: 6.5 %
Neutro Abs: 6484 cells/uL (ref 1500–7800)
Neutrophils Relative %: 64.2 %
Platelets: 353 10*3/uL (ref 140–400)
RBC: 5.1 10*6/uL (ref 3.80–5.10)
RDW: 15 % (ref 11.0–15.0)
Total Lymphocyte: 23.1 %
WBC: 10.1 10*3/uL (ref 3.8–10.8)

## 2020-07-17 LAB — LIPID PANEL
Cholesterol: 191 mg/dL (ref <200)
HDL: 57 mg/dL (ref >=50)
LDL Cholesterol (Calc): 114 mg/dL (calc) — ABNORMAL HIGH
Non-HDL Cholesterol (Calc): 134 mg/dL (calc) — ABNORMAL HIGH (ref <130)
Total CHOL/HDL Ratio: 3.4 (calc) (ref <5.0)
Triglycerides: 98 mg/dL (ref <150)

## 2020-07-17 LAB — COMPREHENSIVE METABOLIC PANEL
AG Ratio: 1.4 (calc) (ref 1.0–2.5)
ALT: 14 U/L (ref 6–29)
AST: 17 U/L (ref 10–35)
Albumin: 4.2 g/dL (ref 3.6–5.1)
Alkaline phosphatase (APISO): 84 U/L (ref 37–153)
BUN/Creatinine Ratio: 18 (calc) (ref 6–22)
BUN: 19 mg/dL (ref 7–25)
CO2: 25 mmol/L (ref 20–32)
Calcium: 9.6 mg/dL (ref 8.6–10.4)
Chloride: 101 mmol/L (ref 98–110)
Creat: 1.07 mg/dL — ABNORMAL HIGH (ref 0.50–1.05)
Globulin: 3.1 g/dL (calc) (ref 1.9–3.7)
Glucose, Bld: 100 mg/dL — ABNORMAL HIGH (ref 65–99)
Potassium: 3.3 mmol/L — ABNORMAL LOW (ref 3.5–5.3)
Sodium: 141 mmol/L (ref 135–146)
Total Bilirubin: 0.4 mg/dL (ref 0.2–1.2)
Total Protein: 7.3 g/dL (ref 6.1–8.1)

## 2020-07-17 LAB — TSH: TSH: 1.75 mIU/L (ref 0.40–4.50)

## 2020-07-20 ENCOUNTER — Encounter: Payer: Self-pay | Admitting: Internal Medicine

## 2020-07-20 DIAGNOSIS — E785 Hyperlipidemia, unspecified: Secondary | ICD-10-CM | POA: Insufficient documentation

## 2020-07-22 ENCOUNTER — Telehealth: Payer: Self-pay | Admitting: Internal Medicine

## 2020-07-22 NOTE — Telephone Encounter (Signed)
Attempted to call patient.  See lab result note.

## 2020-07-22 NOTE — Telephone Encounter (Signed)
Pt returned your call and would like a call back .. °

## 2020-08-13 ENCOUNTER — Ambulatory Visit (INDEPENDENT_AMBULATORY_CARE_PROVIDER_SITE_OTHER): Payer: No Typology Code available for payment source | Admitting: Neurology

## 2020-08-13 ENCOUNTER — Encounter: Payer: Self-pay | Admitting: Neurology

## 2020-08-13 ENCOUNTER — Other Ambulatory Visit: Payer: Self-pay

## 2020-08-13 VITALS — BP 126/87 | HR 84 | Ht 67.0 in | Wt 272.0 lb

## 2020-08-13 DIAGNOSIS — G4726 Circadian rhythm sleep disorder, shift work type: Secondary | ICD-10-CM

## 2020-08-13 DIAGNOSIS — G4733 Obstructive sleep apnea (adult) (pediatric): Secondary | ICD-10-CM

## 2020-08-13 DIAGNOSIS — G2581 Restless legs syndrome: Secondary | ICD-10-CM | POA: Diagnosis not present

## 2020-08-13 DIAGNOSIS — G4719 Other hypersomnia: Secondary | ICD-10-CM | POA: Insufficient documentation

## 2020-08-13 DIAGNOSIS — C73 Malignant neoplasm of thyroid gland: Secondary | ICD-10-CM

## 2020-08-13 DIAGNOSIS — E890 Postprocedural hypothyroidism: Secondary | ICD-10-CM

## 2020-08-13 NOTE — Progress Notes (Signed)
SLEEP MEDICINE CLINIC    Provider:  Larey Seat, MD  Primary Care Physician:  Isaac Bliss, Rayford Halsted, [place] [address]     Referring Provider: Isaac Bliss, Rayford Halsted, [address] [place],  [place] [address]          Chief Complaint according to patient   Patient presents with:    . New Patient (Initial Visit)           HISTORY OF PRESENT ILLNESS:  Margaret Schmitt is a 59 - year -old Caucasian female patient and seen here upon a consultation requested by her primary Physician on 08/13/2020 .Chief concern according to patient : " I moved to this area from [place] , [place] in 2018, and I started working at the [place], [place],  [employer] I now have health insurance and need to address my HTN, possible Apnea."    I have the pleasure of seeing Margaret Schmitt on 08-13-2020, a left-handed Caucasian female with a known OSA  sleep disorder.  She  has a past medical history of Asthma, Morbid Obesity, Eczema,  Hypertension,  Morbid obesity ([place]), OSA (obstructive sleep apnea), Thyroid cancer 1996 ([place]), and aquiered Hypothyroidism.   The patient had the first sleep study in the year 2005 used CPAP for 5 years- and lost weight by KETO diet, enough to eliminate apnea, now she has gained weight back. 2005 in [place] state - has been diagnosed with apnea,  Sleep relevant medical history: Nocturia/ twice, Thyroid cancer with left lobe surgery and medication- no Tonsillectomy, no cervical spine surgery/ deviated septum repair/ or UPPP. 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   Family medical /sleep history: no known other family member on CPAP with OSA, insomnia, sleep walkers.    Social history: Patient is working as a Social research officer, government and works night shift , she lives in a household with significant other and granddaughter. Family status is single with adult children, and one grandchild.  The patient currently works/ used to work in shifts(  Presenter, broadcasting,) Pets are present. 2 cats. Tobacco use; teenager - tried it out- not having been smoking since age 49. ETOH use: none , Caffeine intake in form of Coffee( 2 cups in AM or before beginning of shift) Soda( Dr Malachi Bonds / 2 / day ) Tea ( /) or energy drinks. Regular exercise -none. Hobbies : hiking   Sleep habits are as follows: The patient's dinner time is between 4 PM, before night shift. Keto meals- The patient often falls asleep in her recliner- wakes with palpitations, anxiety- nightmares-spills coffee when fallinng asleep - transfers/  goes to bed 2 hours later at 10 AM and continues to sleep for 5-6 hours, wakes for 1-2 bathroom breaks.   The preferred sleep position is left side, with the support of one pillow, on a flat bed. Dreams are reportedly frequent but not vivid.   4 PM is the usual rise time.  The patient wakes up spontaneously and eats, goes to work.   She reports not feeling refreshed or restored in AM, with symptoms such as dry mouth , but only rarely morning headaches , sometimes with sharp pains in the left head. Sinuitis, postnasal drip, ear pressure.  and residual fatigue. Naps are taken very frequently, in front of the TV, and  lasting from 20-45 minutes and are more refreshing than nocturnal sleep.    Review of Systems: Out of a complete 14 system review, the patient  complains of only the following symptoms, and all other reviewed systems are negative.:  Fatigue, sleepiness , snoring, fragmented sleep, shift work sleep The patient left the office before the visit was finished. Sided headaches. HYPERSOMNIA< SNORING< HTN< VERTIGO< SINUSITIS>    How likely are you to doze in the following situations: 0 = not likely, 1 = slight chance, 2 = moderate chance, 3 = high chance   Sitting and Reading? Watching Television? Sitting inactive in a public place (theater or meeting)? As a passenger in a car for an hour without a break? Lying down in the afternoon when  circumstances permit? Sitting and talking to someone? Sitting quietly after lunch without alcohol? In a car, while stopped for a few minutes in traffic?   Total = 17/ 24 points   FSS endorsed at 56/ 63 points.   Social History   Socioeconomic History  . Marital status: Single    Spouse name: Not on file  . Number of children: Not on file  . Years of education: Not on file  . Highest education level: Not on file  Occupational History  . Not on file  Tobacco Use  . Smoking status: Former Smoker    Types: Cigarettes  . Smokeless tobacco: Never Used  Vaping Use  . Vaping Use: Never used  Substance and Sexual Activity  . Alcohol use: Yes    Comment: rarely  . Drug use: Not Currently  . Sexual activity: Not on file  Other Topics Concern  . Not on file  Social History Narrative  . Not on file   Social Determinants of Health   Financial Resource Strain:   . Difficulty of Paying Living Expenses: Not on file  Food Insecurity:   . Worried About Charity fundraiser in the Last Year: Not on file  . Ran Out of Food in the Last Year: Not on file  Transportation Needs:   . Lack of Transportation (Medical): Not on file  . Lack of Transportation (Non-Medical): Not on file  Physical Activity:   . Days of Exercise per Week: Not on file  . Minutes of Exercise per Session: Not on file  Stress:   . Feeling of Stress : Not on file  Social Connections:   . Frequency of Communication with Friends and Family: Not on file  . Frequency of Social Gatherings with Friends and Family: Not on file  . Attends Religious Services: Not on file  . Active Member of Clubs or Organizations: Not on file  . Attends Archivist Meetings: Not on file  . Marital Status: Not on file    Family History  Problem Relation Age of Onset  . Hypertension Mother   . Cancer Mother        breast cancer  . Hypertension Father   . Cancer Father        prostate cancer  . Stroke Father   . Stroke  Maternal Grandmother     Past Medical History:  Diagnosis Date  . Asthma   . Eczema   . Hypertension   . Hypertension   . Hypothyroidism   . Morbid obesity ([place])   . OSA (obstructive sleep apnea)   . Thyroid cancer ([place])   . Thyroid disease    hypothyroidism    Past Surgical History:  Procedure Laterality Date  . ABDOMINAL HYSTERECTOMY    . ADENOIDECTOMY    . APPENDECTOMY    . CHOLECYSTECTOMY    . KIDNEY SURGERY  left ureter reimplantation  . THYROID LOBECTOMY Left   . TONSILLECTOMY       Current Outpatient Medications on File Prior to Visit  Medication Sig Dispense Refill  . albuterol (VENTOLIN HFA) 108 (90 Base) MCG/ACT inhaler Inhale 2 puffs into the lungs every 6 (six) hours as needed for wheezing or shortness of breath. 18 g 2  . hydrochlorothiazide (HYDRODIURIL) 25 MG tablet Take 1 tablet (25 mg total) by mouth daily. 90 tablet 1  . ibuprofen (ADVIL,MOTRIN) 800 MG tablet Take 1 tablet (800 mg total) by mouth every 8 (eight) hours. 30 tablet 1  . levothyroxine (SYNTHROID) 175 MCG tablet Take 1 tablet (175 mcg total) by mouth daily before breakfast. 90 tablet 0  . Medium Chain Triglycerides (MCT OIL PO) Take 2,000 mg by mouth daily.    . Nutritional Supplements (KETO PO) Take by mouth. Advanced Keto 1500    . triamcinolone (NASACORT ALLERGY 24HR) 55 MCG/ACT AERO nasal inhaler Place 2 sprays into the nose daily.    Marland Kitchen triamcinolone cream (KENALOG) 0.1 % Apply 1 application topically 2 (two) times daily. 30 g 0   No current facility-administered medications on file prior to visit.    Allergies  Allergen Reactions  . Erythromycin Swelling    Physical exam:  Today's Vitals   08/13/20 0819  BP: 126/87  Pulse: 84  Weight: 272 lb (123.4 kg)  Height: 5\' 7"  (1.702 m)   Body mass index is 42.6 kg/m.   Wt Readings from Last 3 Encounters:  08/13/20 272 lb (123.4 kg)  07/16/20 284 lb 3.2 oz (128.9 kg)  06/04/20 284 lb 3.2 oz (128.9 kg)     Ht Readings from  Last 3 Encounters:  08/13/20 5\' 7"  (1.702 m)  06/04/20 5\' 6"  (1.676 m)  11/14/18 5' 5.75" (1.67 m)      General: The patient is awake, alert and appears not in acute distress. The patient is groomed. Head: Normocephalic, atraumatic. Neck is supple. Mallampati 3 ,  neck circumference: 16 inches . Nasal airflow patent.  Retrognathia is not seen. Post nasal drip.  Dental status: no retainer or braces.  Cardiovascular:  Regular rate and cardiac rhythm by pulse,  without distended neck veins. Respiratory: Lungs are clear to auscultation.  Skin:  Without evidence of ankle edema, or rash. Trunk: The patient's posture is erect.   Neurologic exam : The patient is awake and alert, oriented to place and time.   Memory subjective described as intact.  Attention span & concentration ability appears normal.  Speech is fluent,  without  dysarthria, dysphonia or aphasia.  Mood and affect are appropriate.   Cranial nerves: no loss of smell or taste reported  Pupils are equal and briskly reactive to light. Funduscopic exam deferred. .  Extraocular movements in vertical and horizontal planes were intact and without nystagmus. No Diplopia. Visual fields by finger perimetry are intact. Hearing was intact to soft voice and finger rubbing.    Facial sensation intact to fine touch.  Facial motor strength is symmetric and tongue and uvula move midline.  Neck ROM : rotation, tilt and flexion extension were normal for age and shoulder shrug was symmetrical.    Motor exam:  Symmetric bulk, tone and ROM.   Normal tone without cog- wheeling, symmetric grip strength .   Sensory:  Fine touch, pinprick and vibration were tested  and  normal.  Proprioception tested in the upper extremities was normal.   Coordination: Rapid alternating movements in the fingers/hands were  of normal speed.  The Finger-to-nose maneuver was intact without evidence of ataxia, dysmetria or tremor.   Gait and station: Patient could  rise unassisted from a seated position, walked without assistive device.  Stance is of normal width/ base and the patient turned with 3 steps.  Toe and heel walk were deferred.  Deep tendon reflexes: in the  upper and lower extremities are symmetric and intact.  Babinski response was deferred.       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  2) high risk for OSA by BMI ( 42.6 kg/m2), neck size and upper airway anatomy. Snoring and witnessed apnea. This can cause her HTN spikes.  3) restless legs , ever since thyroid disease .  Maternal Grandmother and her children a have RLS. This has affected her work at the [employer]. No anemia history in the last decade.    My Plan is to proceed with:  1) screening for sleep apnea - this can be done by HST , but RLS /PLM diagnosis would need attended sleep study to be worked up.  2) RLS, take vit D, B 12, consider a prenatal.  3)   I would like to thank Isaac Bliss, Rayford Halsted, MD  for allowing me to meet with and to take care of this pleasant patient. Please speak to her about the Covid vaccine.   In short, Margaret Schmitt is presenting with OSA/ RLS.   I plan to follow up either personally or through our NP within 3 month.   CC: I will share my notes with PCP.   Electronically signed by: Larey Seat, MD 08/13/2020 8:37 AM  [facility] Board certified by The [employer] of Sleep Medicine and Diplomate of the [employer] of Sleep Medicine. Board certified In Neurology through the [employer], Fellow of the  [employer] of Neurology. Medical Director of [facility].

## 2020-08-13 NOTE — Patient Instructions (Signed)
Restless Legs Syndrome Restless legs syndrome is a condition that causes uncomfortable feelings or sensations in the legs, especially while sitting or lying down. The sensations usually cause an overwhelming urge to move the legs. The arms can also sometimes be affected. The condition can range from mild to severe. The symptoms often interfere with a person's ability to sleep. What are the causes? The cause of this condition is not known. What increases the risk? The following factors may make you more likely to develop this condition:  Being older than 50.  Pregnancy.  Being a woman. In general, the condition is more common in women than in men.  A family history of the condition.  Having iron deficiency.  Overuse of caffeine, nicotine, or alcohol.  Certain medical conditions, such as kidney disease, Parkinson's disease, or nerve damage.  Certain medicines, such as those for high blood pressure, nausea, colds, allergies, depression, and some heart conditions. What are the signs or symptoms? The main symptom of this condition is uncomfortable sensations in the legs, such as:  Pulling.  Tingling.  Prickling.  Throbbing.  Crawling.  Burning. Usually, the sensations:  Affect both sides of the body.  Are worse when you sit or lie down.  Are worse at night. These may wake you up or make it difficult to fall asleep.  Make you have a strong urge to move your legs.  Are temporarily relieved by moving your legs. The arms can also be affected, but this is rare. People who have this condition often have tiredness during the day because of their lack of sleep at night. How is this diagnosed? This condition may be diagnosed based on:  Your symptoms.  Blood tests. In some cases, you may be monitored in a sleep lab by a specialist (a sleep study). This can detect any disruptions in your sleep. How is this treated? This condition is treated by managing the symptoms. This may  include:  Lifestyle changes, such as exercising, using relaxation techniques, and avoiding caffeine, alcohol, or tobacco.  Medicines. Anti-seizure medicines may be tried first. Follow these instructions at home:     General instructions  Take over-the-counter and prescription medicines only as told by your health care provider.  Use methods to help relieve the uncomfortable sensations, such as: ? Massaging your legs. ? Walking or stretching. ? Taking a cold or hot bath.  Keep all follow-up visits as told by your health care provider. This is important. Lifestyle  Practice good sleep habits. For example, go to bed and get up at the same time every day. Most adults should get 7-9 hours of sleep each night.  Exercise regularly. Try to get at least 30 minutes of exercise most days of the week.  Practice ways of relaxing, such as yoga or meditation.  Avoid caffeine and alcohol.  Do not use any products that contain nicotine or tobacco, such as cigarettes and e-cigarettes. If you need help quitting, ask your health care provider. Contact a health care provider if:  Your symptoms get worse or they do not improve with treatment. Summary  Restless legs syndrome is a condition that causes uncomfortable feelings or sensations in the legs, especially while sitting or lying down.  The symptoms often interfere with a person's ability to sleep.  This condition is treated by managing the symptoms. You may need to make lifestyle changes or take medicines. This information is not intended to replace advice given to you by your health care provider. Make sure   you discuss any questions you have with your health care provider. Document Revised: 11/05/2017 Document Reviewed: 11/05/2017 Elsevier Patient Education  [place]. Sleep Apnea Sleep apnea affects breathing during sleep. It causes breathing to stop for a short time or to become shallow. It can also increase the risk  of:  Heart attack.  Stroke.  Being very overweight (obese).  Diabetes.  Heart failure.  Irregular heartbeat. The goal of treatment is to help you breathe normally again. What are the causes? There are three kinds of sleep apnea:  Obstructive sleep apnea. This is caused by a blocked or collapsed airway.  Central sleep apnea. This happens when the brain does not send the right signals to the muscles that control breathing.  Mixed sleep apnea. This is a combination of obstructive and central sleep apnea. The most common cause of this condition is a collapsed or blocked airway. This can happen if:  Your throat muscles are too relaxed.  Your tongue and tonsils are too large.  You are overweight.  Your airway is too small. What increases the risk?  Being overweight.  Smoking.  Having a small airway.  Being older.  Being female.  Drinking alcohol.  Taking medicines to calm yourself (sedatives or tranquilizers).  Having family members with the condition. What are the signs or symptoms?  Trouble staying asleep.  Being sleepy or tired during the day.  Getting angry a lot.  Loud snoring.  Headaches in the morning.  Not being able to focus your mind (concentrate).  Forgetting things.  Less interest in sex.  Mood swings.  Personality changes.  Feelings of sadness (depression).  Waking up a lot during the night to pee (urinate).  Dry mouth.  Sore throat. How is this diagnosed?  Your medical history.  A physical exam.  A test that is done when you are sleeping (sleep study). The test is most often done in a sleep lab but may also be done at home. How is this treated?   Sleeping on your side.  Using a medicine to get rid of mucus in your nose (decongestant).  Avoiding the use of alcohol, medicines to help you relax, or certain pain medicines (narcotics).  Losing weight, if needed.  Changing your diet.  Not smoking.  Using a machine to  open your airway while you sleep, such as: ? An oral appliance. This is a mouthpiece that shifts your lower jaw forward. ? A CPAP device. This device blows air through a mask when you breathe out (exhale). ? An EPAP device. This has valves that you put in each nostril. ? A BPAP device. This device blows air through a mask when you breathe in (inhale) and breathe out.  Having surgery if other treatments do not work. It is important to get treatment for sleep apnea. Without treatment, it can lead to:  High blood pressure.  Coronary artery disease.  In men, not being able to have an erection (impotence).  Reduced thinking ability. Follow these instructions at home: Lifestyle  Make changes that your doctor recommends.  Eat a healthy diet.  Lose weight if needed.  Avoid alcohol, medicines to help you relax, and some pain medicines.  Do not use any products that contain nicotine or tobacco, such as cigarettes, e-cigarettes, and chewing tobacco. If you need help quitting, ask your doctor. General instructions  Take over-the-counter and prescription medicines only as told by your doctor.  If you were given a machine to use while you sleep,  use it only as told by your doctor.  If you are having surgery, make sure to tell your doctor you have sleep apnea. You may need to bring your device with you.  Keep all follow-up visits as told by your doctor. This is important. Contact a doctor if:  The machine that you were given to use during sleep bothers you or does not seem to be working.  You do not get better.  You get worse. Get help right away if:  Your chest hurts.  You have trouble breathing in enough air.  You have an uncomfortable feeling in your back, arms, or stomach.  You have trouble talking.  One side of your body feels weak.  A part of your face is hanging down. These symptoms may be an emergency. Do not wait to see if the symptoms will go away. Get medical help  right away. Call your local emergency services (911 in the [place] Do not drive yourself to the hospital. Summary  This condition affects breathing during sleep.  The most common cause is a collapsed or blocked airway.  The goal of treatment is to help you breathe normally while you sleep. This information is not intended to replace advice given to you by your health care provider. Make sure you discuss any questions you have with your health care provider. Document Revised: 08/02/2018 Document Reviewed: 06/11/2018 Elsevier Patient Education  [place].

## 2020-08-17 ENCOUNTER — Other Ambulatory Visit: Payer: Self-pay | Admitting: Internal Medicine

## 2020-08-17 DIAGNOSIS — N289 Disorder of kidney and ureter, unspecified: Secondary | ICD-10-CM

## 2020-08-19 ENCOUNTER — Telehealth: Payer: Self-pay

## 2020-08-19 NOTE — Telephone Encounter (Signed)
LVM for pt to call me back to schedule sleep study  

## 2020-09-01 ENCOUNTER — Ambulatory Visit (INDEPENDENT_AMBULATORY_CARE_PROVIDER_SITE_OTHER): Payer: No Typology Code available for payment source | Admitting: Neurology

## 2020-09-01 DIAGNOSIS — G4733 Obstructive sleep apnea (adult) (pediatric): Secondary | ICD-10-CM

## 2020-09-01 DIAGNOSIS — G4719 Other hypersomnia: Secondary | ICD-10-CM

## 2020-09-01 DIAGNOSIS — E890 Postprocedural hypothyroidism: Secondary | ICD-10-CM

## 2020-09-01 DIAGNOSIS — G4726 Circadian rhythm sleep disorder, shift work type: Secondary | ICD-10-CM

## 2020-09-01 DIAGNOSIS — G2581 Restless legs syndrome: Secondary | ICD-10-CM

## 2020-09-07 ENCOUNTER — Other Ambulatory Visit: Payer: Self-pay

## 2020-09-07 ENCOUNTER — Other Ambulatory Visit (INDEPENDENT_AMBULATORY_CARE_PROVIDER_SITE_OTHER): Payer: No Typology Code available for payment source

## 2020-09-07 DIAGNOSIS — N289 Disorder of kidney and ureter, unspecified: Secondary | ICD-10-CM | POA: Diagnosis not present

## 2020-09-07 NOTE — Addendum Note (Signed)
Addended by: Marrion Coy on: 09/07/2020 10:37 AM   Modules accepted: Orders

## 2020-09-08 LAB — BASIC METABOLIC PANEL
BUN: 19 mg/dL (ref 7–25)
CO2: 28 mmol/L (ref 20–32)
Calcium: 9.3 mg/dL (ref 8.6–10.4)
Chloride: 102 mmol/L (ref 98–110)
Creat: 0.9 mg/dL (ref 0.50–1.05)
Glucose, Bld: 105 mg/dL — ABNORMAL HIGH (ref 65–99)
Potassium: 3.4 mmol/L — ABNORMAL LOW (ref 3.5–5.3)
Sodium: 141 mmol/L (ref 135–146)

## 2020-09-09 ENCOUNTER — Encounter: Payer: Self-pay | Admitting: Internal Medicine

## 2020-09-09 DIAGNOSIS — E876 Hypokalemia: Secondary | ICD-10-CM

## 2020-09-13 LAB — HM COLONOSCOPY

## 2020-09-16 NOTE — Procedures (Signed)
PATIENT'S NAME:  Margaret Schmitt, Margaret Schmitt DOB:      [birth year]      MR#:    [id number]     DATE OF RECORDING: 09/01/2020  AL  REFERRING M.D.:  Olam Idler Isaac Bliss, MD Study Performed:   Baseline Polysomnogram HISTORY:  This 59 year -old Caucasian female patient was seen here upon a consultation requested by her primary Physician on 08/13/2020.Chief concern according to patient: " I moved to this area from [place], [place] in 2018, and I started working at the [place] [place] [employer]. I now have health insurance and need to address my HTN, possible Apnea." She works night shifts.    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  The patient had the first sleep study in the year 2005 used CPAP for 5 years- and lost weight by KETO diet, enough to eliminate apnea, now she has gained weight back. Lived at the time (2005) in [place] state - has been diagnosed with apnea,  Sleep relevant medical history: Nocturia/ twice, Thyroid cancer with left lobe surgery and medication- no Tonsillectomy, no cervical spine surgery/ deviated septum repair/ or UPPP. 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  Sleep habits are as follows: The patient's dinner time is between 4 PM, before night shift. Keto meals- The patient often falls asleep in her recliner- wakes with palpitations, anxiety- nightmares-spills coffee when falling asleep - transfers/ goes to bed 2 hours later at 10 AM and continues to sleep for 5-6 hours, wakes for 1-2 bathroom breaks.     The patient endorsed the Epworth Sleepiness Scale at 17 points.   The patient's weight 271 pounds with a height of 67 (inches), resulting in a BMI of 42.6 kg/m2. The patient's neck circumference measured 16 inches.  CURRENT MEDICATIONS:  Ventolin, Hydrodiuril, Advil, Synthroid, Nasacort, Kenalog   PROCEDURE:  This is a multichannel digital polysomnogram utilizing the Somnostar 11.2 system.  Electrodes and sensors were applied and monitored per AASM Specifications.   EEG, EOG, Chin and Limb EMG, were sampled at 200 Hz.  ECG, Snore and Nasal Pressure, Thermal Airflow, Respiratory Effort, CPAP Flow and Pressure, Oximetry was sampled at 50 Hz. Digital video and audio were recorded.      BASELINE STUDY: Lights Out was at 21:41 and Lights On at 04:55.  Total recording time (TRT) was 434.5 minutes, with a total sleep time (TST) of 318 minutes.   The patient's sleep latency was 32 minutes.  REM latency was 65.5 minutes.  The sleep efficiency was 73.2 %.     SLEEP ARCHITECTURE: WASO (Wake after sleep onset) was 105.5 minutes.  There were 30.5 minutes in Stage N1, 105 minutes Stage N2, 122.5 minutes Stage N3 and 60 minutes in Stage REM.  The percentage of Stage N1 was 9.6%, Stage N2 was 33.%, Stage N3 was 38.5% and Stage R (REM sleep) was 18.9%.    RESPIRATORY ANALYSIS:  There were a total of 90 respiratory events:  10 obstructive apneas and 80 hypopneas with a hypopnea index of 15.1 /hour.     The total APNEA/HYPOPNEA INDEX (AHI) was 17.0/hour.  31 events occurred in REM sleep and 110 events in NREM. The REM AHI was  31.0 /hour, versus a non-REM AHI of 13.7. The patient spent 73 minutes of total sleep time in the supine position  and 245 minutes in non-supine. The supine AHI was 15.6/h versus a non-supine AHI of 17.3/h.  OXYGEN SATURATION & C02:  The Wake baseline 02 saturation was 97%, with the lowest being 75%. Time spent below 89% saturation equaled 22 minutes.  The arousals were noted as: 96 were spontaneous, 0 were associated with PLMs, 45 were associated with respiratory events. The patient had a total of 0 Periodic Limb Movements.   Audio and video analysis did not show any abnormal or unusual movements, behaviors, phonations or  vocalizations.  The patient took bathroom breaks. Very loud Snoring was noted. EKG was in keeping with normal sinus rhythm (NSR).  IMPRESSION:  1. Mild- moderate Obstructive Sleep Apnea (OSA) at AHI of 17/h with poor sleep efficiency and frequent arousals. There was prolonged sleep hypoxia at 22 minutes out of 318 minutes total sleep time. 2. REM sleep AHI was 31/h, this is REM dependent OSA.   3. Loud Snoring.  RECOMMENDATIONS:  1. Advise starting on autotitration device with a setting from 6-16 cm water, 1 cm EPR and mask of choice, with heated humidification. 2. 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   3. Hypersomnia may be not fully resolved with CPAP therapy, we need to follow the patient Epworth Sleepiness Score and consider narcolepsy work up with HLA test/ MSLT..  I certify that I have reviewed the entire raw data recording prior to the issuance of this report in accordance with the Standards of Accreditation of the American Academy of Sleep Medicine (AASM)   Larey Seat, MD Diplomat, American Board of Psychiatry and Neurology  Diplomat, American Board of Sleep Medicine Market researcher, [facility] [employer]

## 2020-09-16 NOTE — Progress Notes (Signed)
IMPRESSION:  1. Mild- moderate Obstructive Sleep Apnea (OSA) at AHI of 17/h with poor sleep efficiency and frequent arousals. There was prolonged sleep hypoxia at 22 minutes out of 318 minutes total sleep time. 2. REM sleep AHI was 31/h, this is REM dependent OSA.   3. Loud Snoring.  RECOMMENDATIONS:  1. Advise starting on autotitration device with a setting from 6-16 cm water, 1 cm EPR and mask of choice, with heated humidification. 2. 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   3. Hypersomnia may be not fully resolved with CPAP therapy, we need to follow the patient Epworth Sleepiness Score and consider narcolepsy work up with HLA test/ MSLT.

## 2020-09-16 NOTE — Addendum Note (Signed)
Addended by: Larey Seat on: 09/16/2020 05:56 PM   Modules accepted: Orders

## 2020-09-20 ENCOUNTER — Telehealth: Payer: Self-pay | Admitting: Neurology

## 2020-09-20 NOTE — Telephone Encounter (Signed)
-----  Message from Larey Seat, MD sent at 09/16/2020  5:56 PM EST ----- IMPRESSION:  1. Mild- moderate Obstructive Sleep Apnea (OSA) at AHI of 17/h with poor sleep efficiency and frequent arousals. There was prolonged sleep hypoxia at 22 minutes out of 318 minutes total sleep time. 2. REM sleep AHI was 31/h, this is REM dependent OSA.   3. Loud Snoring.  RECOMMENDATIONS:  1. Advise starting on autotitration device with a setting from 6-16 cm water, 1 cm EPR and mask of choice, with heated humidification. 2. 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   3. Hypersomnia may be not fully resolved with CPAP therapy, we need to follow the patient Epworth Sleepiness Score and consider narcolepsy work up with HLA test/ MSLT.

## 2020-09-20 NOTE — Telephone Encounter (Signed)
Called patient to discuss sleep study results. No answer at this time. LVM for the patient to call back.   

## 2020-09-21 ENCOUNTER — Encounter: Payer: Self-pay | Admitting: Internal Medicine

## 2020-09-22 ENCOUNTER — Other Ambulatory Visit: Payer: Self-pay | Admitting: Internal Medicine

## 2020-09-30 ENCOUNTER — Encounter: Payer: Self-pay | Admitting: Neurology

## 2020-09-30 NOTE — Telephone Encounter (Signed)
Pt returned call. I advised pt that Dr. Brett Fairy reviewed their sleep study results and found that pt has sleep apnea. Dr. Brett Fairy recommends that pt starts auto CPAP. I reviewed PAP compliance expectations with the pt. Pt is agreeable to starting a CPAP. I advised pt that an order will be sent to a DME, [facility] and [facility] will call the pt within about one week after they file with the pt's insurance. [facility]) will show the pt how to use the machine, fit for masks, and troubleshoot the CPAP if needed. A follow up appt will need to be made for insurance purposes with Dr. Brett Fairy or NP. Pt verbalized understanding to call out office and scheduled initial cpap apt 31-90 days after date scheduled to pick up machine from DME company. A letter with all of this information in it will be sent to the pt as a reminder through mychart.  Pt verbalized understanding of results. Pt had no questions at this time but was encouraged to call back if questions arise. I have sent the order to [facility]) and have received confirmation that they have received the order.

## 2020-09-30 NOTE — Telephone Encounter (Signed)
Called the patient a 2nd time there was no answer. LVM again and will also send a mychart message as a 3rd attempt to reach pt.

## 2020-10-08 ENCOUNTER — Encounter: Payer: Self-pay | Admitting: Internal Medicine

## 2020-11-12 ENCOUNTER — Encounter: Payer: Self-pay | Admitting: Neurology

## 2020-12-17 ENCOUNTER — Other Ambulatory Visit: Payer: Self-pay | Admitting: Internal Medicine

## 2020-12-17 DIAGNOSIS — I10 Essential (primary) hypertension: Secondary | ICD-10-CM

## 2021-04-18 ENCOUNTER — Encounter: Payer: Self-pay | Admitting: Neurology

## 2021-05-04 ENCOUNTER — Telehealth: Payer: Self-pay | Admitting: Neurology

## 2021-05-04 NOTE — Telephone Encounter (Signed)
I called and LVM for patient to call back to schedule her initial cpap follow-up with Dr. Brett Fairy. This needs to be scheduled between 06/04/21 and 08/02/21.

## 2021-05-19 NOTE — Telephone Encounter (Signed)
I called and patient didn't answer I left a voice mail with the date and time I scheduled her for and said to call us back back if that doesn't work. Per Emma's message this can be a 15 min slot.

## 2021-05-19 NOTE — Telephone Encounter (Signed)
Margaret Schmitt- can you please call pt and schedule follow up with AL,NP in 745a slot in timeframe below? I saw she has some openings. Thank you

## 2021-05-19 NOTE — Telephone Encounter (Signed)
Check for dates to schedule Initial CPAP with Dr. Dyanne Iha. There were not any dates within compliance dates.  Informed patient would send a telephone note to the nurse. Patient verbalized understand Would like a call from the nurse to discuss a date so insurance will cover CPAP machine.

## 2021-06-21 LAB — HM MAMMOGRAPHY

## 2021-06-24 ENCOUNTER — Encounter: Payer: Self-pay | Admitting: Internal Medicine

## 2021-06-24 DIAGNOSIS — Z1211 Encounter for screening for malignant neoplasm of colon: Secondary | ICD-10-CM

## 2021-07-19 ENCOUNTER — Ambulatory Visit (INDEPENDENT_AMBULATORY_CARE_PROVIDER_SITE_OTHER): Payer: No Typology Code available for payment source | Admitting: Family Medicine

## 2021-07-19 ENCOUNTER — Encounter: Payer: Self-pay | Admitting: Family Medicine

## 2021-07-19 ENCOUNTER — Telehealth: Payer: Self-pay | Admitting: *Deleted

## 2021-07-19 VITALS — BP 137/89 | HR 87 | Ht 67.0 in | Wt 284.0 lb

## 2021-07-19 DIAGNOSIS — G4733 Obstructive sleep apnea (adult) (pediatric): Secondary | ICD-10-CM | POA: Diagnosis not present

## 2021-07-19 DIAGNOSIS — G4726 Circadian rhythm sleep disorder, shift work type: Secondary | ICD-10-CM

## 2021-07-19 DIAGNOSIS — Z9989 Dependence on other enabling machines and devices: Secondary | ICD-10-CM

## 2021-07-19 NOTE — Patient Instructions (Addendum)
Please continue using your CPAP regularly. While your insurance requires that you use CPAP at least 4 hours each night on 70% of the nights, I recommend, that you not skip any nights and use it throughout the night if you can. Getting used to CPAP and staying with the treatment long term does take time and patience and discipline. Untreated obstructive sleep apnea when it is moderate to severe can have an adverse impact on cardiovascular health and raise her risk for heart disease, arrhythmias, hypertension, congestive heart failure, stroke and diabetes. Untreated obstructive sleep apnea causes sleep disruption, nonrestorative sleep, and sleep deprivation. This can have an impact on your day to day functioning and cause daytime sleepiness and impairment of cognitive function, memory loss, mood disturbance, and problems focussing. Using CPAP regularly can improve these symptoms.  Please call DME regarding concerns of not recording correct data. Also ask about water.   DME: [facility] Phone: [phone]

## 2021-07-19 NOTE — Telephone Encounter (Signed)
noted 

## 2021-07-19 NOTE — Telephone Encounter (Signed)
LVM for pt to remind her to bring cpap machine/powercord to appt with her today. Also offered 10am appt this morning instead of 345p per AL,NP request. Advised her to call back to let us know if she would like this appt instead.

## 2021-07-19 NOTE — Progress Notes (Signed)
PATIENT: Margaret Schmitt DOB: June 02, 1961  REASON FOR VISIT: follow up HISTORY FROM: patient  Chief Complaint  Patient presents with   Follow-up    Pt alone, rm 11. Pt states that when started the machine it was ok.     HISTORY OF PRESENT ILLNESS:  07/19/21 ALL:  Margaret Schmitt is a 60 y.o. female here today for follow up for OSA on CPAP.  PSG 09/01/2020 showed mild to moderate OSA with total AHI of 17/hr and REM AHI 31/hr. AutoPAP was ordered with pressure settings of 6-16cmH20. Set up date 05/04/2021. She reports that when she first got started on CPAP, she felt much better. She was no longer falling asleep at work. She did not need naps. About a month ago, she started feeling badly again. She feels that it is difficult to stay awake at work. She is adamant that she uses CPAP for greater than 4 hours every day. She continues to work nights and has alternating sleep schedules. She is concerned that her machine is not performing correctly. She reports times when she holds her breath to see if pressure will adjust and it doesn't. She has Personnel officer.   Compliance report dated 06/17/2021-07/19/2021 shows that she used CPAP 32/33 days for daily compliance of 97%. She used CPAP greater than 4 ours 23/33 days for compliance of 69%. Average usage was 5hr and 30min. Residual AHI was 0.2/hr on 6-16cmH20. No significant leak was noted. No spikes noted in pressure, highest pressure documented was 7cmH20.    HISTORY: (copied from Dr Dohmeier's previous note)  Margaret Schmitt is a 22 - year -old Caucasian female patient and seen here upon a consultation requested by her primary Physician on 08/13/2020 .Chief concern according to patient : " I moved to this area from [place] , [place] in 2018, and I started working at the [place], [place],  [employer]. I now have health insurance and need to address my HTN, possible Apnea."    I have the pleasure of seeing Margaret Schmitt on 08-13-2020, a left-handed  Caucasian female with a known OSA  sleep disorder.  She  has a past medical history of Asthma, Morbid Obesity, Eczema,  Hypertension,  Morbid obesity ([place]), OSA (obstructive sleep apnea), Thyroid cancer 1996 ([place]), and aquiered Hypothyroidism.   The patient had the first sleep study in the year 2005 used CPAP for 5 years- and lost weight by KETO diet, enough to eliminate apnea, now she has gained weight back. 2005 in [place] state - has been diagnosed with apnea,  Sleep relevant medical history: Nocturia/ twice, Thyroid cancer with left lobe surgery and medication- no Tonsillectomy, no cervical spine surgery/ deviated septum repair/ or UPPP. 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  Family medical /sleep history: no known other family member on CPAP with OSA, insomnia, sleep walkers.    Social history: Patient is working as a Social research officer, government and works night shift , she lives in a household with significant other and granddaughter. Family status is single with adult children, and one grandchild.  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 2 cats. Tobacco use; teenager - tried it out- not having been smoking since age 47. ETOH use: none , Caffeine intake in form of Coffee( 2 cups in AM or before beginning of shift) Soda( Dr Malachi Bonds / 2 / day ) Tea ( /) or energy drinks. Regular exercise -none. Hobbies : hiking   Sleep habits are as  follows: The patient's dinner time is between 4 PM, before night shift. Keto meals- The patient often falls asleep in her recliner- wakes with palpitations, anxiety- nightmares-spills coffee when fallinng asleep - transfers/  goes to bed 2 hours later at 10 AM and continues to sleep for 5-6 hours, wakes for 1-2 bathroom breaks.   The preferred sleep position is left side, with the support of one pillow, on a flat bed. Dreams are reportedly frequent but not vivid.   4 PM is the usual rise time.  The patient wakes up spontaneously and eats,  goes to work.   She reports not feeling refreshed or restored in AM, with symptoms such as dry mouth , but only rarely morning headaches , sometimes with sharp pains in the left head. Sinuitis, postnasal drip, ear pressure.  and residual fatigue. Naps are taken very frequently, in front of the TV, and  lasting from 20-45 minutes and are more refreshing than nocturnal sleep.    REVIEW OF SYSTEMS: Out of a complete 14 system review of symptoms, the patient complains only of the following symptoms, fatigue, daytime sleepiness and all other reviewed systems are negative.  ESS: 19  ALLERGIES: Allergies  Allergen Reactions   Erythromycin Swelling    HOME MEDICATIONS: Outpatient Medications Prior to Visit  Medication Sig Dispense Refill   albuterol (VENTOLIN HFA) 108 (90 Base) MCG/ACT inhaler Inhale 2 puffs into the lungs every 6 (six) hours as needed for wheezing or shortness of breath. 18 g 2   EUTHYROX 175 MCG tablet TAKE 1 TABLET BY MOUTH ONCE DAILY BEFORE BREAKFAST 90 tablet 1   hydrochlorothiazide (HYDRODIURIL) 25 MG tablet Take 1 tablet by mouth once daily 90 tablet 1   ibuprofen (ADVIL,MOTRIN) 800 MG tablet Take 1 tablet (800 mg total) by mouth every 8 (eight) hours. 30 tablet 1   Medium Chain Triglycerides (MCT OIL PO) Take 2,000 mg by mouth daily.     Nutritional Supplements (KETO PO) Take by mouth. Advanced Keto 1500     triamcinolone (NASACORT ALLERGY 24HR) 55 MCG/ACT AERO nasal inhaler Place 2 sprays into the nose daily.     triamcinolone cream (KENALOG) 0.1 % Apply 1 application topically 2 (two) times daily. 30 g 0   No facility-administered medications prior to visit.    PAST MEDICAL HISTORY: Past Medical History:  Diagnosis Date   Asthma    Eczema    Hypertension    Hypertension    Hypothyroidism    Morbid obesity ([facility])    OSA (obstructive sleep apnea)    Thyroid cancer ([place])    Thyroid disease    hypothyroidism    PAST SURGICAL HISTORY: Past Surgical History:   Procedure Laterality Date   ABDOMINAL HYSTERECTOMY     ADENOIDECTOMY     APPENDECTOMY     CHOLECYSTECTOMY     KIDNEY SURGERY     left ureter reimplantation   THYROID LOBECTOMY Left    TONSILLECTOMY      FAMILY HISTORY: Family History  Problem Relation Age of Onset   Hypertension Mother    Cancer Mother        breast cancer   Hypertension Father    Cancer Father        prostate cancer   Stroke Father    Stroke Maternal Grandmother     SOCIAL HISTORY: Social History   Socioeconomic History   Marital status: Single    Spouse name: Not on file   Number of children: Not on file  Years of education: Not on file   Highest education level: Not on file  Occupational History   Not on file  Tobacco Use   Smoking status: Former    Types: Cigarettes   Smokeless tobacco: Never  Vaping Use   Vaping Use: Never used  Substance and Sexual Activity   Alcohol use: Yes    Comment: rarely   Drug use: Not Currently   Sexual activity: Not on file  Other Topics Concern   Not on file  Social History Narrative   Not on file   Social Determinants of Health   Financial Resource Strain: Not on file  Food Insecurity: Not on file  Transportation Needs: Not on file  Physical Activity: Not on file  Stress: Not on file  Social Connections: Not on file  Intimate Partner Violence: Not on file     PHYSICAL EXAM  There were no vitals filed for this visit. There is no height or weight on file to calculate BMI.  Generalized: Well developed, in no acute distress  Cardiology: normal rate and rhythm, no murmur noted Respiratory: clear to auscultation bilaterally  Neurological examination  Mentation: Alert oriented to time, place, history taking. Follows all commands speech and language fluent Cranial nerve II-XII: Pupils were equal round reactive to light. Extraocular movements were full, visual field were full  Motor: The motor testing reveals 5 over 5 strength of all 4 extremities.  Good symmetric motor tone is noted throughout.  Gait and station: Gait is normal.    DIAGNOSTIC DATA (LABS, IMAGING, TESTING) - I reviewed patient records, labs, notes, testing and imaging myself where available.  No flowsheet data found.   Lab Results  Component Value Date   WBC 10.1 07/16/2020   HGB 13.6 07/16/2020   HCT 41.2 07/16/2020   MCV 80.8 07/16/2020   PLT 353 07/16/2020      Component Value Date/Time   NA 141 09/07/2020 1037   K 3.4 (L) 09/07/2020 1037   CL 102 09/07/2020 1037   CO2 28 09/07/2020 1037   GLUCOSE 105 (H) 09/07/2020 1037   BUN 19 09/07/2020 1037   CREATININE 0.90 09/07/2020 1037   CALCIUM 9.3 09/07/2020 1037   PROT 7.3 07/16/2020 1014   ALBUMIN 4.2 11/12/2018 0915   AST 17 07/16/2020 1014   ALT 14 07/16/2020 1014   ALKPHOS 60 11/12/2018 0915   BILITOT 0.4 07/16/2020 1014   GFRNONAA >60 11/11/2019 1415   GFRAA >60 11/11/2019 1415   Lab Results  Component Value Date   CHOL 191 07/16/2020   HDL 57 07/16/2020   LDLCALC 114 (H) 07/16/2020   TRIG 98 07/16/2020   CHOLHDL 3.4 07/16/2020   Lab Results  Component Value Date   HGBA1C 5.8 (H) 11/12/2018   No results found for: VITAMINB12 Lab Results  Component Value Date   TSH 1.75 07/16/2020     ASSESSMENT AND PLAN 60 y.o. year old female  has a past medical history of Asthma, Eczema, Hypertension, Hypertension, Hypothyroidism, Morbid obesity ([place]), OSA (obstructive sleep apnea), Thyroid cancer ([place]), and Thyroid disease. here with     ICD-10-CM   1. OSA on CPAP  G47.33    Z99.89     2. 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 Compliance report reveals excellent daily compliance with sub optimal four hour compliance. She was encouraged to continue using CPAP nightly and for greater  than 4 hours each night. I have given her the contact information for [facility] and advised that she have her machine evaluated to make  sure it is recording correct data and operating correctly. Risks of untreated sleep apnea review and education materials provided. Healthy lifestyle habits encouraged. She will follow up in 3 months with Dr Brett Fairy to determine need for narcolepsy testing pending improved compliance with CPAP. She verbalizes understanding and agreement with this plan.    No orders of the defined types were placed in this encounter.    No orders of the defined types were placed in this encounter.     Debbora Presto, FNP-C 07/19/2021, 3:37 PM [facility] [address] [place], [place] [address] [phone]

## 2021-07-19 NOTE — Telephone Encounter (Signed)
FYI- Pt called back says she cannot come at 10.

## 2021-07-20 ENCOUNTER — Encounter: Payer: Self-pay | Admitting: Internal Medicine

## 2021-07-21 ENCOUNTER — Other Ambulatory Visit: Payer: Self-pay

## 2021-07-21 ENCOUNTER — Encounter: Payer: Self-pay | Admitting: Family

## 2021-07-21 ENCOUNTER — Telehealth: Payer: Self-pay | Admitting: Internal Medicine

## 2021-07-21 ENCOUNTER — Telehealth (INDEPENDENT_AMBULATORY_CARE_PROVIDER_SITE_OTHER): Payer: No Typology Code available for payment source | Admitting: Family

## 2021-07-21 VITALS — Ht 67.0 in | Wt 284.0 lb

## 2021-07-21 DIAGNOSIS — R059 Cough, unspecified: Secondary | ICD-10-CM | POA: Diagnosis not present

## 2021-07-21 DIAGNOSIS — U071 COVID-19: Secondary | ICD-10-CM | POA: Diagnosis not present

## 2021-07-21 MED ORDER — MOLNUPIRAVIR EUA 200MG CAPSULE: 4.0000 | ORAL_CAPSULE | Freq: Two times a day (BID) | ORAL | 0 refills | Status: AC

## 2021-07-21 NOTE — Progress Notes (Signed)
   Virtual Visit via Video   I connected with patient on 07/21/21 at  3:35 PM EDT by a video enabled telemedicine application and verified that I am speaking with the correct person using two identifiers.  Location patient: Home Location provider: [facility], Office Persons participating in the virtual visit: Lavinia Mcneely Justin Mend, Jerrel Ivory  I discussed the limitations of evaluation and management by telemedicine and the availability of in person appointments. The patient expressed understanding and agreed to proceed.  Subjective:   HPI:   60 year female is in today via video visit after testing positive for COVID-19. She reports having congestion, sneezing, coughing and congestion. She tested positive via rapid antigen test. Would like to consider   ROS:   See pertinent positives and negatives per HPI.  Patient Active Problem List   Diagnosis Date Noted   RLS (restless legs syndrome) 08/13/2020   Excessive daytime sleepiness 08/13/2020   Circadian rhythm sleep disorder, 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   Hyperlipidemia 07/20/2020   Eczema    Thyroid cancer ([place])    Hypertension    OSA (obstructive sleep apnea)    Essential hypertension 08/22/2018   Hypothyroidism 08/22/2018   Tobacco use disorder 08/22/2018   Morbid obesity ([place]) 08/22/2018    Social History   Tobacco Use   Smoking status: Former    Types: Cigarettes   Smokeless tobacco: Never  Substance Use Topics   Alcohol use: Yes    Comment: rarely    Current Outpatient Medications:    albuterol (VENTOLIN HFA) 108 (90 Base) MCG/ACT inhaler, Inhale 2 puffs into the lungs every 6 (six) hours as needed for wheezing or shortness of breath., Disp: 18 g, Rfl: 2   EUTHYROX 175 MCG tablet, TAKE 1 TABLET BY MOUTH ONCE DAILY BEFORE BREAKFAST, Disp: 90 tablet, Rfl: 1   hydrochlorothiazide (HYDRODIURIL) 25 MG tablet, Take 1 tablet by mouth once daily, Disp: 90 tablet, Rfl: 1   ibuprofen (ADVIL,MOTRIN) 800 MG tablet,  Take 1 tablet (800 mg total) by mouth every 8 (eight) hours., Disp: 30 tablet, Rfl: 1   Medium Chain Triglycerides (MCT OIL PO), Take 2,000 mg by mouth daily., Disp: , Rfl:    molnupiravir EUA (LAGEVRIO) 200 mg CAPS capsule, Take 4 capsules (800 mg total) by mouth 2 (two) times daily for 5 days., Disp: 40 capsule, Rfl: 0   triamcinolone (NASACORT) 55 MCG/ACT AERO nasal inhaler, Place 2 sprays into the nose daily., Disp: , Rfl:   Allergies  Allergen Reactions   Erythromycin Swelling    Objective:   Ht 5\' 7"  (1.702 m)   Wt 283 lb 15.2 oz (128.8 kg)   BMI 44.47 kg/m   Patient is well-developed, well-nourished in no acute distress.  Resting comfortably at home.  Head is normocephalic, atraumatic.  No labored breathing.  Speech is clear and coherent with logical content.  Patient is alert and oriented at baseline.   Assessment and Plan:     Ralonda was seen today for covid positive, nasal congestion, cough, headache, nausea and fever.  Diagnoses and all orders for this visit:  COVID-19  Cough  Other orders -     molnupiravir EUA (LAGEVRIO) 200 mg CAPS capsule; Take 4 capsules (800 mg total) by mouth 2 (two) times daily for 5 days.    Call the office with any questions or concerns. Recheck as scheduled and as needed.   Kennyth Arnold, FNP 07/21/2021

## 2021-07-21 NOTE — Telephone Encounter (Signed)
Patient scheduled.

## 2021-07-21 NOTE — Telephone Encounter (Signed)
Patient wants to know if she can be prescribed something for covid symptoms. Patient does have mychart virtual appointment scheduled for Tuesday, just wants to see if she can get something earlier.    Please send to  [place], [place] - [address] Phone:  [phone]  Fax:  [phone]       Please Advise

## 2021-07-22 NOTE — Telephone Encounter (Signed)
Patient had a virtual visit with Dutch Quint NP 07/21/22.

## 2021-07-25 ENCOUNTER — Encounter (HOSPITAL_COMMUNITY): Payer: Self-pay | Admitting: *Deleted

## 2021-07-25 ENCOUNTER — Encounter: Payer: Self-pay | Admitting: Internal Medicine

## 2021-07-25 ENCOUNTER — Emergency Department (HOSPITAL_COMMUNITY)
Admission: EM | Admit: 2021-07-25 | Discharge: 2021-07-25 | Disposition: A | Discharge status: Home / Self Care | Payer: No Typology Code available for payment source | Attending: Emergency Medicine | Admitting: Emergency Medicine

## 2021-07-25 ENCOUNTER — Other Ambulatory Visit: Payer: Self-pay

## 2021-07-25 DIAGNOSIS — Z85850 Personal history of malignant neoplasm of thyroid: Secondary | ICD-10-CM | POA: Diagnosis not present

## 2021-07-25 DIAGNOSIS — M79661 Pain in right lower leg: Secondary | ICD-10-CM | POA: Diagnosis present

## 2021-07-25 DIAGNOSIS — I10 Essential (primary) hypertension: Secondary | ICD-10-CM | POA: Insufficient documentation

## 2021-07-25 DIAGNOSIS — M79662 Pain in left lower leg: Secondary | ICD-10-CM | POA: Insufficient documentation

## 2021-07-25 DIAGNOSIS — M79605 Pain in left leg: Secondary | ICD-10-CM

## 2021-07-25 DIAGNOSIS — Z8616 Personal history of COVID-19: Secondary | ICD-10-CM | POA: Diagnosis not present

## 2021-07-25 DIAGNOSIS — Z79899 Other long term (current) drug therapy: Secondary | ICD-10-CM | POA: Diagnosis not present

## 2021-07-25 DIAGNOSIS — J45909 Unspecified asthma, uncomplicated: Secondary | ICD-10-CM | POA: Diagnosis not present

## 2021-07-25 DIAGNOSIS — Z87891 Personal history of nicotine dependence: Secondary | ICD-10-CM | POA: Insufficient documentation

## 2021-07-25 DIAGNOSIS — E039 Hypothyroidism, unspecified: Secondary | ICD-10-CM | POA: Diagnosis not present

## 2021-07-25 DIAGNOSIS — M79604 Pain in right leg: Secondary | ICD-10-CM

## 2021-07-25 NOTE — ED Triage Notes (Addendum)
Tested  positive for covid last week, pain in both calves, concerned she may have clots in legs

## 2021-07-25 NOTE — ED Notes (Signed)
Pt concerned about blood clots to bilateral LE's.  Pt recently tested positive for covid on Wednesday

## 2021-07-25 NOTE — Discharge Instructions (Addendum)
Return tomorrow at the given time for ultrasounds of your legs to rule out DVT.

## 2021-07-25 NOTE — ED Provider Notes (Signed)
[facility] EMERGENCY DEPARTMENT Provider Note   CSN: [id number] Arrival date & time: 07/25/21  8546     History No chief complaint on file.   Margaret Schmitt is a 60 y.o. female.  Patient is a 60 year old female with past medical history of hypertension, hypothyroidism, asthma.  Patient presenting today for evaluation of bilateral leg pain.  She describes pain to the back of both legs just inferior to her calves.  Pain is worse when she ambulates.  She was recently diagnosed with COVID-19 and is concerned she may have blood clots.  She called her primary doctor who sent her here.  She denies any chest pain or difficulty breathing.  Patient has no history of DVT.  The history is provided by the patient.      Past Medical History:  Diagnosis Date   Asthma    Eczema    Hypertension    Hypertension    Hypothyroidism    Morbid obesity ([place])    OSA (obstructive sleep apnea)    Thyroid cancer ([place])    Thyroid disease    hypothyroidism    Patient Active Problem List   Diagnosis Date Noted   RLS (restless legs syndrome) 08/13/2020   Excessive daytime sleepiness 08/13/2020   Circadian rhythm sleep disorder, 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   Hyperlipidemia 07/20/2020   Eczema    Thyroid cancer ([place])    Hypertension    OSA (obstructive sleep apnea)    Essential hypertension 08/22/2018   Hypothyroidism 08/22/2018   Tobacco use disorder 08/22/2018   Morbid obesity ([place]) 08/22/2018    Past Surgical History:  Procedure Laterality Date   ABDOMINAL HYSTERECTOMY     ADENOIDECTOMY     APPENDECTOMY     CHOLECYSTECTOMY     KIDNEY SURGERY     left ureter reimplantation   THYROID LOBECTOMY Left    TONSILLECTOMY       OB History   No obstetric history on file.     Family History  Problem Relation Age of Onset   Hypertension Mother    Cancer Mother        breast cancer   Hypertension Father    Cancer Father        prostate cancer   Stroke Father    Stroke Maternal  Grandmother     Social History   Tobacco Use   Smoking status: Former    Types: Cigarettes   Smokeless tobacco: Never  Vaping Use   Vaping Use: Never used  Substance Use Topics   Alcohol use: Yes    Comment: rarely   Drug use: Not Currently    Home Medications Prior to Admission medications   Medication Sig Start Date End Date Taking? Authorizing Provider  albuterol (VENTOLIN HFA) 108 (90 Base) MCG/ACT inhaler Inhale 2 puffs into the lungs every 6 (six) hours as needed for wheezing or shortness of breath. 07/16/20   Isaac Bliss, Rayford Halsted, MD  EUTHYROX 175 MCG tablet TAKE 1 TABLET BY MOUTH ONCE DAILY BEFORE BREAKFAST 09/22/20   Isaac Bliss, Rayford Halsted, MD  hydrochlorothiazide (HYDRODIURIL) 25 MG tablet Take 1 tablet by mouth once daily 12/17/20   Isaac Bliss, Rayford Halsted, MD  ibuprofen (ADVIL,MOTRIN) 800 MG tablet Take 1 tablet (800 mg total) by mouth every 8 (eight) hours. 08/22/18   Soyla Dryer, PA-C  Medium Chain Triglycerides (MCT OIL PO) Take 2,000 mg by mouth daily.    [provider]  molnupiravir EUA (LAGEVRIO) 200 mg CAPS capsule Take  4 capsules (800 mg total) by mouth 2 (two) times daily for 5 days. 07/21/21 07/26/21  Kennyth Arnold, FNP  triamcinolone (NASACORT) 55 MCG/ACT AERO nasal inhaler Place 2 sprays into the nose daily.    [provider]    Allergies    Erythromycin  Review of Systems   Review of Systems  All other systems reviewed and are negative.  Physical Exam Updated Vital Signs BP (!) 145/106 (BP Location: Right Arm)   Pulse 83   Temp 98.6 F (37 C) (Oral)   Resp 16   Ht 5\' 10"  (1.778 m)   Wt 125 kg   SpO2 94%   BMI 39.54 kg/m   Physical Exam Vitals and nursing note reviewed.  Constitutional:      General: She is not in acute distress.    Appearance: Normal appearance. She is not ill-appearing.  HENT:     Head: Normocephalic and atraumatic.  Pulmonary:     Effort: Pulmonary effort is normal.   Musculoskeletal:     Comments: Bilateral lower extremities are grossly normal in appearance.  There is no edema or swelling.  DP pulses are easily palpable bilaterally and motor and sensation are intact to both feet.  There is no calf tenderness and Homans' sign is absent.  She is tender to the posterior aspect of the leg just inferior to the calves, but no abnormality in this area is noted.  Skin:    General: Skin is warm and dry.  Neurological:     Mental Status: She is alert and oriented to person, place, and time.    ED Results / Procedures / Treatments   Labs (all labs ordered are listed, but only abnormal results are displayed) Labs Reviewed - No data to display  EKG None  Radiology No results found.  Procedures Procedures   Medications Ordered in ED Medications - No data to display  ED Course  I have reviewed the triage vital signs and the nursing notes.  Pertinent labs & imaging results that were available during my care of the patient were reviewed by me and considered in my medical decision making (see chart for details).    MDM Rules/Calculators/A&P  Patient sent by her primary doctor over concerns of possible bilateral DVT.  As her symptoms are bilateral, I doubt this to be the case, but will set the patient up for ultrasound in the morning as the ultrasound tech has been gone.  Final Clinical Impression(s) / ED Diagnoses Final diagnoses:  None    Rx / DC Orders ED Discharge Orders     None        Veryl Speak, MD 07/25/21 2311

## 2021-07-26 ENCOUNTER — Telehealth: Payer: No Typology Code available for payment source | Admitting: Internal Medicine

## 2021-08-05 ENCOUNTER — Other Ambulatory Visit: Payer: Self-pay | Admitting: Internal Medicine

## 2021-08-05 DIAGNOSIS — I10 Essential (primary) hypertension: Secondary | ICD-10-CM

## 2021-08-11 ENCOUNTER — Encounter: Payer: Self-pay | Admitting: Internal Medicine

## 2021-09-13 ENCOUNTER — Ambulatory Visit (INDEPENDENT_AMBULATORY_CARE_PROVIDER_SITE_OTHER): Payer: No Typology Code available for payment source | Admitting: Family Medicine

## 2021-09-13 ENCOUNTER — Encounter: Payer: Self-pay | Admitting: Family Medicine

## 2021-09-13 ENCOUNTER — Other Ambulatory Visit: Payer: Self-pay

## 2021-09-13 VITALS — BP 182/98 | HR 85 | Temp 98.0°F | Ht 67.0 in | Wt 288.0 lb

## 2021-09-13 DIAGNOSIS — H6123 Impacted cerumen, bilateral: Secondary | ICD-10-CM | POA: Diagnosis not present

## 2021-09-13 DIAGNOSIS — E890 Postprocedural hypothyroidism: Secondary | ICD-10-CM

## 2021-09-13 DIAGNOSIS — C73 Malignant neoplasm of thyroid gland: Secondary | ICD-10-CM

## 2021-09-13 DIAGNOSIS — G4733 Obstructive sleep apnea (adult) (pediatric): Secondary | ICD-10-CM

## 2021-09-13 DIAGNOSIS — J0110 Acute frontal sinusitis, unspecified: Secondary | ICD-10-CM

## 2021-09-13 DIAGNOSIS — E782 Mixed hyperlipidemia: Secondary | ICD-10-CM

## 2021-09-13 DIAGNOSIS — I10 Essential (primary) hypertension: Secondary | ICD-10-CM

## 2021-09-13 MED ORDER — DOXYCYCLINE HYCLATE 100 MG PO TABS: 100.0000 mg | ORAL_TABLET | Freq: Two times a day (BID) | ORAL | 0 refills | Status: AC

## 2021-09-13 MED ORDER — LISINOPRIL 20 MG PO TABS
20.0000 mg | ORAL_TABLET | Freq: Every day | ORAL | 3 refills | Status: DC
Start: 2021-09-13 — End: 2021-11-17

## 2021-09-13 NOTE — Progress Notes (Signed)
Subjective:  Patient ID: Margaret Schmitt, female    DOB: 03/25/1961, 60 y.o.   MRN: [id number]  Patient Care Team: Baruch Gouty, FNP as PCP - General (Family Medicine)   Chief Complaint:  New Patient (Initial Visit) (Chronic sinus infection, swollen gland, hypertension)   HPI: Margaret Schmitt is a 60 y.o. female presenting on 09/13/2021 for New Patient (Initial Visit) (Chronic sinus infection, swollen gland, hypertension)   Patient presents today to establish care with new PCP.  She was formally followed by one of our in [place].  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  She has been battling hypertension for several years and reports her former PCP only has her on hydrochlorothiazide.  States her blood pressure remains above 170/90 on a regular basis.  She does try to watch her diet and monitor salt intake.  Over the last few months she has slipped on her diet some.  She denies any focal neurological deficits, visual changes, weakness, confusion, chest pain, palpitations, shortness of breath, or syncope.  She does have some lower extremity swelling which is well controlled with hydrochlorothiazide.  She reports she was diagnosed with obstructive sleep apnea around 6 months ago and has been using her CPAP machine as prescribed.  States this has helped as she works night shift and has difficulty sleeping.  She is concerned about ongoing sinus pressure, ear fullness, and swollen lymph nodes in her.  This has been present since September when she had COVID.  States she has postnasal drip and nasal congestion with the sinus pressure and pain.  She has been using Nasacort without relief of symptoms.  She denies fever or chills but does have malaise at times. She is not up-to-date on health maintenance and wishes to wait [facility] when her new insurance  starts.  Relevant past medical, surgical, family, and social history reviewed and updated as indicated.  Allergies and medications reviewed and updated. Data reviewed: Chart in Epic.   Past Medical History:  Diagnosis Date   Asthma    Eczema    Hypertension    Hypertension    Hypothyroidism    Morbid obesity (HCC)    OSA (obstructive sleep apnea)    Thyroid cancer ([place])    Thyroid disease    hypothyroidism    Past Surgical History:  Procedure Laterality Date   ABDOMINAL HYSTERECTOMY     ADENOIDECTOMY     APPENDECTOMY     CHOLECYSTECTOMY     KIDNEY SURGERY     left ureter reimplantation   THYROID LOBECTOMY Left    TONSILLECTOMY      Social History   Socioeconomic History   Marital status: Single    Spouse name: Not on file   Number of children: Not on file   Years of education: Not on file   Highest education level: Not on file  Occupational History   Not on file  Tobacco Use   Smoking status: Former    Types: Cigarettes   Smokeless tobacco: Never  Vaping Use   Vaping Use: Never used  Substance and Sexual Activity   Alcohol use: Yes    Comment: rarely   Drug use: Not Currently   Sexual activity: Not on file  Other Topics Concern   Not on file  Social History Narrative   Not on file   Social Determinants of Health   Financial  Resource Strain: Not on file  Food Insecurity: Not on file  Transportation Needs: Not on file  Physical Activity: Not on file  Stress: Not on file  Social Connections: Not on file  Intimate Partner Violence: Not on file    Outpatient Encounter Medications as of 09/13/2021  Medication Sig   albuterol (VENTOLIN HFA) 108 (90 Base) MCG/ACT inhaler Inhale 2 puffs into the lungs every 6 (six) hours as needed for wheezing or shortness of breath.   doxycycline (VIBRA-TABS) 100 MG tablet Take 1 tablet (100 mg total) by mouth 2 (two) times daily for 10 days. 1 po bid   EUTHYROX 175 MCG tablet TAKE 1 TABLET BY MOUTH ONCE DAILY BEFORE  BREAKFAST   hydrochlorothiazide (HYDRODIURIL) 25 MG tablet Take 1 tablet by mouth once daily   ibuprofen (ADVIL,MOTRIN) 800 MG tablet Take 1 tablet (800 mg total) by mouth every 8 (eight) hours.   lisinopril (ZESTRIL) 20 MG tablet Take 1 tablet (20 mg total) by mouth daily.   Medium Chain Triglycerides (MCT OIL PO) Take 2,000 mg by mouth daily.   Multiple Vitamin (MULTIVITAMIN) tablet Take 1 tablet by mouth daily.   triamcinolone (NASACORT) 55 MCG/ACT AERO nasal inhaler Place 2 sprays into the nose daily.   No facility-administered encounter medications on file as of 09/13/2021.    Allergies  Allergen Reactions   Erythromycin Swelling    Review of Systems  Constitutional:  Negative for activity change, appetite change, chills, diaphoresis, fatigue, fever and unexpected weight change.  HENT:  Positive for congestion, postnasal drip, sinus pressure and sinus pain. Negative for dental problem, drooling, ear discharge, ear pain, facial swelling, hearing loss, mouth sores, nosebleeds, rhinorrhea, sneezing, sore throat, tinnitus, trouble swallowing and voice change.   Eyes: Negative.   Respiratory:  Negative for cough, chest tightness and shortness of breath.   Cardiovascular:  Negative for chest pain, palpitations and leg swelling.  Gastrointestinal:  Negative for abdominal pain, blood in stool, constipation, diarrhea, nausea and vomiting.  Endocrine: Negative.   Genitourinary:  Negative for decreased urine volume, difficulty urinating, dysuria, frequency and urgency.  Musculoskeletal:  Negative for arthralgias and myalgias.  Skin: Negative.   Allergic/Immunologic: Negative.   Neurological:  Negative for dizziness and headaches.  Hematological:  Positive for adenopathy. Does not bruise/bleed easily.  Psychiatric/Behavioral:  Negative for confusion, hallucinations, sleep disturbance and suicidal ideas.   All other systems reviewed and are negative.      Objective:  BP (!) 182/98 (BP  Location: Left Arm, Cuff Size: Normal)   Pulse 85   Temp 98 F (36.7 C)   Ht '5\' 7"'  (1.702 m)   Wt 288 lb (130.6 kg)   SpO2 98%   BMI 45.11 kg/m    Wt Readings from Last 3 Encounters:  09/13/21 288 lb (130.6 kg)  07/25/21 275 lb 9.2 oz (125 kg)  07/21/21 283 lb 15.2 oz (128.8 kg)    Physical Exam Vitals and nursing note reviewed.  Constitutional:      General: She is not in acute distress.    Appearance: Normal appearance. She is well-developed and well-groomed. She is morbidly obese. She is not ill-appearing, toxic-appearing or diaphoretic.  HENT:     Head: Normocephalic and atraumatic.     Jaw: There is normal jaw occlusion.     Right Ear: Hearing normal. There is impacted cerumen.     Left Ear: Hearing normal. There is impacted cerumen.     Nose: Congestion present. No nasal deformity, septal deviation, signs  of injury, laceration, nasal tenderness, mucosal edema or rhinorrhea.     Right Sinus: Frontal sinus tenderness present.     Left Sinus: Frontal sinus tenderness present.     Mouth/Throat:     Lips: Pink.     Mouth: Mucous membranes are moist.     Pharynx: Oropharynx is clear. Uvula midline. Posterior oropharyngeal erythema present. No pharyngeal swelling, oropharyngeal exudate or uvula swelling.     Tonsils: No tonsillar exudate or tonsillar abscesses.  Eyes:     General: Lids are normal.     Extraocular Movements: Extraocular movements intact.     Conjunctiva/sclera: Conjunctivae normal.     Pupils: Pupils are equal, round, and reactive to light.  Neck:     Thyroid: No thyroid mass, thyromegaly or thyroid tenderness.     Vascular: No carotid bruit or JVD.     Trachea: Trachea and phonation normal.  Cardiovascular:     Rate and Rhythm: Normal rate and regular rhythm.     Chest Wall: PMI is not displaced.     Pulses: Normal pulses.     Heart sounds: Normal heart sounds. No murmur heard.   No friction rub. No gallop.  Pulmonary:     Effort: Pulmonary effort is  normal. No respiratory distress.     Breath sounds: Normal breath sounds. No wheezing.  Abdominal:     General: Bowel sounds are normal. There is no abdominal bruit.     Palpations: Abdomen is soft. There is no hepatomegaly or splenomegaly.  Musculoskeletal:        General: Normal range of motion.     Cervical back: Normal range of motion and neck supple.     Right lower leg: No edema.     Left lower leg: No edema.  Lymphadenopathy:     Cervical: Cervical adenopathy present.     Right cervical: Superficial cervical adenopathy present. No deep or posterior cervical adenopathy.    Left cervical: Superficial cervical adenopathy present. No deep or posterior cervical adenopathy.  Skin:    General: Skin is warm and dry.     Capillary Refill: Capillary refill takes less than 2 seconds.     Coloration: Skin is not cyanotic, jaundiced or pale.     Findings: No rash.  Neurological:     General: No focal deficit present.     Mental Status: She is alert and oriented to person, place, and time.     Sensory: Sensation is intact.     Motor: Motor function is intact.     Coordination: Coordination is intact.     Gait: Gait is intact.     Deep Tendon Reflexes: Reflexes are normal and symmetric.  Psychiatric:        Attention and Perception: Attention and perception normal.        Mood and Affect: Mood and affect normal.        Speech: Speech normal.        Behavior: Behavior normal. Behavior is cooperative.        Thought Content: Thought content normal.        Cognition and Memory: Cognition and memory normal.        Judgment: Judgment normal.    Results for orders placed or performed in visit on 06/24/21  HM MAMMOGRAPHY  Result Value Ref Range   HM Mammogram 0-4 Bi-Rad 0-4 Bi-Rad, Self Reported Normal       Pertinent labs & imaging results that were available during my care of  the patient were reviewed by me and considered in my medical decision making.  Assessment & Plan:  Megan was  seen today for new patient (initial visit).  Diagnoses and all orders for this visit:  Essential hypertension Poorly controlled.  We will check the labs.  Lisinopril 20 mg to current regimen.  Follow-up in 3 weeks for blood pressure and BMP reevaluation.  DASH diet and exercise encouraged.  Monitor blood pressure and report any persistent high readings. -     CBC with Differential/Platelet -     CMP14+EGFR -     Lipid panel -     Microalbumin / creatinine urine ratio -     Thyroid Panel With TSH -     lisinopril (ZESTRIL) 20 MG tablet; Take 1 tablet (20 mg total) by mouth daily.  Morbid obesity ([place]) Diet and exercise encouraged.  Labs pending. -     CBC with Differential/Platelet -     CMP14+EGFR -     Lipid panel -     Thyroid Panel With TSH  Thyroid cancer ([place]) Postoperative hypothyroidism Denies current hypo or hyperthyroid symptoms.  Labs pending.  Will adjust repletion therapy if warranted. -     Thyroid Panel With TSH  Mixed hyperlipidemia Diet and exercise encouraged.  Labs pending we will -     Lipid panel  OSA (obstructive sleep apnea) Followed by neurology on a regular basis and compliant with CPAP therapy.  Acute non-recurrent frontal sinusitis Ongoing symptoms for over a month.  Symptomatic care discussed in detail.  Continue Nasacort.  Doxycycline as prescribed. -     doxycycline (VIBRA-TABS) 100 MG tablet; Take 1 tablet (100 mg total) by mouth 2 (two) times daily for 10 days. 1 po bid  Bilateral impacted cerumen Declined cerumen removal in office today.  Patient advised to use Debrox nightly.  Continue all other maintenance medications.  Follow up plan: Return in about 3 weeks (around 10/04/2021), or if symptoms worsen or fail to improve, for HTN, BMP.   Continue healthy lifestyle choices, including diet (rich in fruits, vegetables, and lean proteins, and low in salt and simple carbohydrates) and exercise (at least 30 minutes of moderate physical activity  daily).  Educational handout given for DASH diet, HTN  The above assessment and management plan was discussed with the patient. The patient verbalized understanding of and has agreed to the management plan. Patient is aware to call the clinic if they develop any new symptoms or if symptoms persist or worsen. Patient is aware when to return to the clinic for a follow-up visit. Patient educated on when it is appropriate to go to the emergency department.   Monia Pouch, FNP-C [facility] [phone]

## 2021-09-13 NOTE — Patient Instructions (Signed)

## 2021-09-14 LAB — CMP14+EGFR
ALT: 14 IU/L (ref 0–32)
AST: 15 IU/L (ref 0–40)
Albumin/Globulin Ratio: 1.5 (ref 1.2–2.2)
Albumin: 4.1 g/dL (ref 3.8–4.9)
Alkaline Phosphatase: 94 IU/L (ref 44–121)
BUN/Creatinine Ratio: 18 (ref 9–23)
BUN: 18 mg/dL (ref 6–24)
Bilirubin Total: 0.2 mg/dL (ref 0.0–1.2)
CO2: 24 mmol/L (ref 20–29)
Calcium: 9.2 mg/dL (ref 8.7–10.2)
Chloride: 103 mmol/L (ref 96–106)
Creatinine, Ser: 0.98 mg/dL (ref 0.57–1.00)
Globulin, Total: 2.7 g/dL (ref 1.5–4.5)
Glucose: 111 mg/dL — ABNORMAL HIGH (ref 70–99)
Potassium: 4.6 mmol/L (ref 3.5–5.2)
Sodium: 141 mmol/L (ref 134–144)
Total Protein: 6.8 g/dL (ref 6.0–8.5)
eGFR: 66 mL/min/{1.73_m2} (ref >59)

## 2021-09-14 LAB — CBC WITH DIFFERENTIAL/PLATELET
Basophils Absolute: 0.1 10*3/uL (ref 0.0–0.2)
Basos: 1 %
EOS (ABSOLUTE): 0.5 10*3/uL — ABNORMAL HIGH (ref 0.0–0.4)
Eos: 5 %
Hematocrit: 39 % (ref 34.0–46.6)
Hemoglobin: 12.6 g/dL (ref 11.1–15.9)
Immature Grans (Abs): 0 10*3/uL (ref 0.0–0.1)
Immature Granulocytes: 0 %
Lymphocytes Absolute: 2.3 10*3/uL (ref 0.7–3.1)
Lymphs: 22 %
MCH: 26.1 pg — ABNORMAL LOW (ref 26.6–33.0)
MCHC: 32.3 g/dL (ref 31.5–35.7)
MCV: 81 fL (ref 79–97)
Monocytes Absolute: 0.7 10*3/uL (ref 0.1–0.9)
Monocytes: 7 %
Neutrophils Absolute: 6.6 10*3/uL (ref 1.4–7.0)
Neutrophils: 65 %
Platelets: 337 10*3/uL (ref 150–450)
RBC: 4.82 x10E6/uL (ref 3.77–5.28)
RDW: 15.8 % — ABNORMAL HIGH (ref 11.7–15.4)
WBC: 10.3 10*3/uL (ref 3.4–10.8)

## 2021-09-14 LAB — THYROID PANEL WITH TSH
Free Thyroxine Index: 1.7 (ref 1.2–4.9)
T3 Uptake Ratio: 23 % — ABNORMAL LOW (ref 24–39)
T4, Total: 7.2 ug/dL (ref 4.5–12.0)
TSH: 4.62 u[IU]/mL — ABNORMAL HIGH (ref 0.450–4.500)

## 2021-09-14 LAB — LIPID PANEL
Chol/HDL Ratio: 3.5 ratio (ref 0.0–4.4)
Cholesterol, Total: 201 mg/dL — ABNORMAL HIGH (ref 100–199)
HDL: 57 mg/dL (ref >39)
LDL Chol Calc (NIH): 121 mg/dL — ABNORMAL HIGH (ref 0–99)
Triglycerides: 128 mg/dL (ref 0–149)
VLDL Cholesterol Cal: 23 mg/dL (ref 5–40)

## 2021-09-19 ENCOUNTER — Telehealth: Payer: Self-pay | Admitting: Family Medicine

## 2021-09-19 DIAGNOSIS — J0110 Acute frontal sinusitis, unspecified: Secondary | ICD-10-CM

## 2021-09-19 MED ORDER — CEFDINIR 300 MG PO CAPS
300.0000 mg | ORAL_CAPSULE | Freq: Two times a day (BID) | ORAL | 0 refills | Status: AC
Start: 2021-09-19 — End: 2021-09-24

## 2021-09-19 NOTE — Telephone Encounter (Signed)
Pt called to let provider know that the antibiotic that was prescribed to her is making her sick and have diarrhea. Wants to know if something else can be sent in for her?  Please advise and call patient.

## 2021-09-19 NOTE — Telephone Encounter (Signed)
Doxy Covering pcp- please advise

## 2021-09-19 NOTE — Telephone Encounter (Signed)
lmtcb

## 2021-09-19 NOTE — Telephone Encounter (Signed)
Omnicef sent in

## 2021-10-04 ENCOUNTER — Ambulatory Visit (INDEPENDENT_AMBULATORY_CARE_PROVIDER_SITE_OTHER): Payer: No Typology Code available for payment source | Admitting: Family Medicine

## 2021-10-04 ENCOUNTER — Encounter: Payer: Self-pay | Admitting: Family Medicine

## 2021-10-04 VITALS — BP 149/84 | HR 84 | Temp 96.9°F | Ht 67.0 in | Wt 289.0 lb

## 2021-10-04 DIAGNOSIS — I10 Essential (primary) hypertension: Secondary | ICD-10-CM | POA: Diagnosis not present

## 2021-10-04 LAB — BMP8+EGFR
BUN/Creatinine Ratio: 26 — ABNORMAL HIGH (ref 9–23)
BUN: 24 mg/dL (ref 6–24)
CO2: 22 mmol/L (ref 20–29)
Calcium: 9.6 mg/dL (ref 8.7–10.2)
Chloride: 103 mmol/L (ref 96–106)
Creatinine, Ser: 0.93 mg/dL (ref 0.57–1.00)
Glucose: 129 mg/dL — ABNORMAL HIGH (ref 70–99)
Potassium: 4.7 mmol/L (ref 3.5–5.2)
Sodium: 141 mmol/L (ref 134–144)
eGFR: 71 mL/min/{1.73_m2} (ref >59)

## 2021-10-04 NOTE — Progress Notes (Signed)
Subjective:  Patient ID: Margaret Schmitt, female    DOB: 06/13/1961, 60 y.o.   MRN: [id number]  Patient Care Team: Baruch Gouty, FNP as PCP - General (Family Medicine)   Chief Complaint:  Hypertension (Hypertension follow up//)   HPI: Margaret Schmitt is a 60 y.o. female presenting on 10/04/2021 for Hypertension (Hypertension follow up//)   Hypertension This is a chronic problem. The current episode started more than 1 year ago. The problem has been gradually improving since onset. The problem is uncontrolled. Pertinent negatives include no anxiety, blurred vision, chest pain, headaches, malaise/fatigue, neck pain, orthopnea, palpitations, peripheral edema, PND, shortness of breath or sweats. Risk factors for coronary artery disease include obesity, family history and sedentary lifestyle. Past treatments include ACE inhibitors and diuretics. The current treatment provides moderate improvement. Compliance problems include exercise and diet.     Relevant past medical, surgical, family, and social history reviewed and updated as indicated.  Allergies and medications reviewed and updated. Data reviewed: Chart in Epic.   Past Medical History:  Diagnosis Date   Asthma    Eczema    Hypertension    Hypertension    Hypothyroidism    Morbid obesity (HCC)    OSA (obstructive sleep apnea)    Thyroid cancer ([place])    Thyroid disease    hypothyroidism    Past Surgical History:  Procedure Laterality Date   ABDOMINAL HYSTERECTOMY     ADENOIDECTOMY     APPENDECTOMY     CHOLECYSTECTOMY     KIDNEY SURGERY     left ureter reimplantation   THYROID LOBECTOMY Left    TONSILLECTOMY      Social History   Socioeconomic History   Marital status: Single    Spouse name: Not on file   Number of children: Not on file   Years of education: Not on file   Highest education level: Not on file  Occupational History   Not on file  Tobacco Use   Smoking status: Former    Types: Cigarettes    Smokeless tobacco: Never  Vaping Use   Vaping Use: Never used  Substance and Sexual Activity   Alcohol use: Yes    Comment: rarely   Drug use: Not Currently   Sexual activity: Not on file  Other Topics Concern   Not on file  Social History Narrative   Not on file   Social Determinants of Health   Financial Resource Strain: Not on file  Food Insecurity: Not on file  Transportation Needs: Not on file  Physical Activity: Not on file  Stress: Not on file  Social Connections: Not on file  Intimate Partner Violence: Not on file    Outpatient Encounter Medications as of 10/04/2021  Medication Sig   albuterol (VENTOLIN HFA) 108 (90 Base) MCG/ACT inhaler Inhale 2 puffs into the lungs every 6 (six) hours as needed for wheezing or shortness of breath.   EUTHYROX 175 MCG tablet TAKE 1 TABLET BY MOUTH ONCE DAILY BEFORE BREAKFAST   hydrochlorothiazide (HYDRODIURIL) 25 MG tablet Take 1 tablet by mouth once daily   ibuprofen (ADVIL,MOTRIN) 800 MG tablet Take 1 tablet (800 mg total) by mouth every 8 (eight) hours.   lisinopril (ZESTRIL) 20 MG tablet Take 1 tablet (20 mg total) by mouth daily.   Medium Chain Triglycerides (MCT OIL PO) Take 2,000 mg by mouth daily.   Multiple Vitamin (MULTIVITAMIN) tablet Take 1 tablet by mouth daily.   triamcinolone (NASACORT) 55 MCG/ACT AERO  nasal inhaler Place 2 sprays into the nose daily.   No facility-administered encounter medications on file as of 10/04/2021.    Allergies  Allergen Reactions   Erythromycin Swelling    Review of Systems  Constitutional:  Negative for activity change, appetite change, chills, diaphoresis, fatigue, fever, malaise/fatigue and unexpected weight change.  HENT: Negative.    Eyes: Negative.  Negative for blurred vision, photophobia and visual disturbance.  Respiratory:  Negative for cough, chest tightness and shortness of breath.   Cardiovascular:  Negative for chest pain, palpitations, orthopnea, leg swelling and PND.   Gastrointestinal:  Negative for abdominal pain, blood in stool, constipation, diarrhea, nausea and vomiting.  Endocrine: Negative.   Genitourinary:  Negative for decreased urine volume, dysuria, frequency and urgency.  Musculoskeletal:  Negative for arthralgias, myalgias and neck pain.  Skin: Negative.   Allergic/Immunologic: Negative.   Neurological:  Negative for dizziness, tremors, seizures, syncope, facial asymmetry, speech difficulty, weakness, light-headedness, numbness and headaches.  Hematological: Negative.   Psychiatric/Behavioral:  Negative for confusion, hallucinations, sleep disturbance and suicidal ideas.   All other systems reviewed and are negative.      Objective:  BP (!) 149/84   Pulse 84   Temp (!) 96.9 F (36.1 C)   Ht '5\' 7"'  (1.702 m)   Wt 289 lb (131.1 kg)   SpO2 97%   BMI 45.26 kg/m    Wt Readings from Last 3 Encounters:  10/04/21 289 lb (131.1 kg)  09/13/21 288 lb (130.6 kg)  07/25/21 275 lb 9.2 oz (125 kg)    Physical Exam Vitals and nursing note reviewed.  Constitutional:      General: She is not in acute distress.    Appearance: Normal appearance. She is well-developed and well-groomed. She is not ill-appearing, toxic-appearing or diaphoretic.  HENT:     Head: Normocephalic and atraumatic.     Jaw: There is normal jaw occlusion.     Right Ear: Hearing normal.     Left Ear: Hearing normal.     Nose: Nose normal.     Mouth/Throat:     Lips: Pink.     Mouth: Mucous membranes are moist.     Pharynx: Oropharynx is clear. Uvula midline.  Eyes:     General: Lids are normal.     Extraocular Movements: Extraocular movements intact.     Conjunctiva/sclera: Conjunctivae normal.     Pupils: Pupils are equal, round, and reactive to light.  Neck:     Thyroid: No thyroid mass, thyromegaly or thyroid tenderness.     Vascular: No carotid bruit or JVD.     Trachea: Trachea and phonation normal.  Cardiovascular:     Rate and Rhythm: Normal rate and  regular rhythm.     Chest Wall: PMI is not displaced.     Pulses: Normal pulses.     Heart sounds: Normal heart sounds. No murmur heard.   No friction rub. No gallop.  Pulmonary:     Effort: Pulmonary effort is normal. No respiratory distress.     Breath sounds: Normal breath sounds. No wheezing.  Abdominal:     General: There is no abdominal bruit.     Palpations: There is no hepatomegaly or splenomegaly.  Musculoskeletal:        General: Normal range of motion.     Cervical back: Normal range of motion and neck supple.     Right lower leg: No edema.     Left lower leg: No edema.  Lymphadenopathy:  Cervical: No cervical adenopathy.  Skin:    General: Skin is warm and dry.     Capillary Refill: Capillary refill takes less than 2 seconds.     Coloration: Skin is not cyanotic, jaundiced or pale.     Findings: No rash.  Neurological:     General: No focal deficit present.     Mental Status: She is alert and oriented to person, place, and time.     Sensory: Sensation is intact.     Motor: Motor function is intact.     Coordination: Coordination is intact.     Gait: Gait is intact.     Deep Tendon Reflexes: Reflexes are normal and symmetric.  Psychiatric:        Attention and Perception: Attention and perception normal.        Mood and Affect: Mood and affect normal.        Speech: Speech normal.        Behavior: Behavior normal. Behavior is cooperative.        Thought Content: Thought content normal.        Cognition and Memory: Cognition and memory normal.        Judgment: Judgment normal.    Results for orders placed or performed in visit on 09/13/21  CBC with Differential/Platelet  Result Value Ref Range   WBC 10.3 3.4 - 10.8 x10E3/uL   RBC 4.82 3.77 - 5.28 x10E6/uL   Hemoglobin 12.6 11.1 - 15.9 g/dL   Hematocrit 39.0 34.0 - 46.6 %   MCV 81 79 - 97 fL   MCH 26.1 (L) 26.6 - 33.0 pg   MCHC 32.3 31.5 - 35.7 g/dL   RDW 15.8 (H) 11.7 - 15.4 %   Platelets 337 150 - 450  x10E3/uL   Neutrophils 65 Not Estab. %   Lymphs 22 Not Estab. %   Monocytes 7 Not Estab. %   Eos 5 Not Estab. %   Basos 1 Not Estab. %   Neutrophils Absolute 6.6 1.4 - 7.0 x10E3/uL   Lymphocytes Absolute 2.3 0.7 - 3.1 x10E3/uL   Monocytes Absolute 0.7 0.1 - 0.9 x10E3/uL   EOS (ABSOLUTE) 0.5 (H) 0.0 - 0.4 x10E3/uL   Basophils Absolute 0.1 0.0 - 0.2 x10E3/uL   Immature Granulocytes 0 Not Estab. %   Immature Grans (Abs) 0.0 0.0 - 0.1 x10E3/uL  CMP14+EGFR  Result Value Ref Range   Glucose 111 (H) 70 - 99 mg/dL   BUN 18 6 - 24 mg/dL   Creatinine, Ser 0.98 0.57 - 1.00 mg/dL   eGFR 66 >59 mL/min/1.73   BUN/Creatinine Ratio 18 9 - 23   Sodium 141 134 - 144 mmol/L   Potassium 4.6 3.5 - 5.2 mmol/L   Chloride 103 96 - 106 mmol/L   CO2 24 20 - 29 mmol/L   Calcium 9.2 8.7 - 10.2 mg/dL   Total Protein 6.8 6.0 - 8.5 g/dL   Albumin 4.1 3.8 - 4.9 g/dL   Globulin, Total 2.7 1.5 - 4.5 g/dL   Albumin/Globulin Ratio 1.5 1.2 - 2.2   Bilirubin Total 0.2 0.0 - 1.2 mg/dL   Alkaline Phosphatase 94 44 - 121 IU/L   AST 15 0 - 40 IU/L   ALT 14 0 - 32 IU/L  Lipid panel  Result Value Ref Range   Cholesterol, Total 201 (H) 100 - 199 mg/dL   Triglycerides 128 0 - 149 mg/dL   HDL 57 >39 mg/dL   VLDL Cholesterol Cal 23 5 - 40  mg/dL   LDL Chol Calc (NIH) 121 (H) 0 - 99 mg/dL   Chol/HDL Ratio 3.5 0.0 - 4.4 ratio  Thyroid Panel With TSH  Result Value Ref Range   TSH 4.620 (H) 0.450 - 4.500 uIU/mL   T4, Total 7.2 4.5 - 12.0 ug/dL   T3 Uptake Ratio 23 (L) 24 - 39 %   Free Thyroxine Index 1.7 1.2 - 4.9       Pertinent labs & imaging results that were available during my care of the patient were reviewed by me and considered in my medical decision making.  Assessment & Plan:  Evelena was seen today for hypertension.  Diagnoses and all orders for this visit:  Essential hypertension BP fairly controlled. Changes were not made in regimen today. Goal BP is 130/80. Pt aware to report any persistent high or  low readings. DASH diet and exercise encouraged. Exercise at least 150 minutes per week and increase as tolerated. Goal BMI > 25. Stress management encouraged. Avoid nicotine and tobacco product use. Avoid excessive alcohol and NSAID's. Avoid more than 2000 mg of sodium daily. Medications as prescribed. Follow up as scheduled.  -     BMP8+EGFR    Continue all other maintenance medications.  Follow up plan: Return in about 3 months (around 01/02/2022), or if symptoms worsen or fail to improve, for CPE, HTN.   Continue healthy lifestyle choices, including diet (rich in fruits, vegetables, and lean proteins, and low in salt and simple carbohydrates) and exercise (at least 30 minutes of moderate physical activity daily).  Educational handout given for DASH diet  The above assessment and management plan was discussed with the patient. The patient verbalized understanding of and has agreed to the management plan. Patient is aware to call the clinic if they develop any new symptoms or if symptoms persist or worsen. Patient is aware when to return to the clinic for a follow-up visit. Patient educated on when it is appropriate to go to the emergency department.   Monia Pouch, FNP-C [facility] [phone]

## 2021-10-04 NOTE — Patient Instructions (Signed)
BP fairly controlled. Changes were not made in regimen today. Goal BP is 140/90. Pt aware to report any persistent high or low readings. DASH diet and exercise encouraged. Exercise at least 150 minutes per week and increase as tolerated. Goal BMI > 25. Stress management encouraged. Avoid nicotine and tobacco product use. Avoid excessive alcohol and NSAID's. Avoid more than 2000 mg of sodium daily. Medications as prescribed. Follow up as scheduled.    Goal BP:  For patients younger than 60: Goal BP < 140/90. For patients 60 and older: Goal BP < 150/90. For patients with diabetes: Goal BP < 140/90.  Take your medications faithfully as prescribed. Maintain a healthy weight. Get at least 150 minutes of aerobic exercise per week. Minimize salt intake, less than 2000 mg per day. Minimize alcohol intake.  DASH Eating Plan DASH stands for "Dietary Approaches to Stop Hypertension." The DASH eating plan is a healthy eating plan that has been shown to reduce high blood pressure (hypertension). Additional health benefits may include reducing the risk of type 2 diabetes mellitus, heart disease, and stroke. The DASH eating plan may also help with weight loss.  WHAT DO I NEED TO KNOW ABOUT THE DASH EATING PLAN? For the DASH eating plan, you will follow these general guidelines: Choose foods with a percent daily value for sodium of less than 5% (as listed on the food label). Use salt-free seasonings or herbs instead of table salt or sea salt. Check with your health care provider or pharmacist before using salt substitutes. Eat lower-sodium products, often labeled as "lower sodium" or "no salt added." Eat fresh foods. Eat more vegetables, fruits, and low-fat dairy products. Choose whole grains. Look for the word "whole" as the first word in the ingredient list. Choose fish and skinless chicken or [place] more often than red meat. Limit fish, poultry, and meat to 6 oz (170 g) each day. Limit sweets,  desserts, sugars, and sugary drinks. Choose heart-healthy fats. Limit cheese to 1 oz (28 g) per day. Eat more home-cooked food and less restaurant, buffet, and fast food. Limit fried foods. Cook foods using methods other than frying. Limit canned vegetables. If you do use them, rinse them well to decrease the sodium. When eating at a restaurant, ask that your food be prepared with less salt, or no salt if possible.  WHAT FOODS CAN I EAT? Seek help from a dietitian for individual calorie needs.  Grains Whole grain or whole wheat bread. Brown rice. Whole grain or whole wheat pasta. Quinoa, bulgur, and whole grain cereals. Low-sodium cereals. Corn or whole wheat flour tortillas. Whole grain cornbread. Whole grain crackers. Low-sodium crackers.  Vegetables Fresh or frozen vegetables (raw, steamed, roasted, or grilled). Low-sodium or reduced-sodium tomato and vegetable juices. Low-sodium or reduced-sodium tomato sauce and paste. Low-sodium or reduced-sodium canned vegetables.   Fruits All fresh, canned (in natural juice), or frozen fruits.  Meat and Other Protein Products Ground beef (85% or leaner), grass-fed beef, or beef trimmed of fat. Skinless chicken or [place]. Ground chicken or [place]. Pork trimmed of fat. All fish and seafood. Eggs. Dried beans, peas, or lentils. Unsalted nuts and seeds. Unsalted canned beans.  Dairy Low-fat dairy products, such as skim or 1% milk, 2% or reduced-fat cheeses, low-fat ricotta or cottage cheese, or plain low-fat yogurt. Low-sodium or reduced-sodium cheeses.  Fats and Oils Tub margarines without trans fats. Light or reduced-fat mayonnaise and salad dressings (reduced sodium). Avocado. Safflower, olive, or canola oils. Natural peanut or almond butter.  Other Unsalted popcorn and pretzels. The items listed above may not be a complete list of recommended foods or beverages. Contact your dietitian for more options.  WHAT FOODS ARE NOT  RECOMMENDED?  Grains White bread. White pasta. White rice. Refined cornbread. Bagels and croissants. Crackers that contain trans fat.  Vegetables Creamed or fried vegetables. Vegetables in a cheese sauce. Regular canned vegetables. Regular canned tomato sauce and paste. Regular tomato and vegetable juices.  Fruits Dried fruits. Canned fruit in light or heavy syrup. Fruit juice.  Meat and Other Protein Products Fatty cuts of meat. Ribs, chicken wings, bacon, sausage, bologna, salami, chitterlings, fatback, hot dogs, bratwurst, and packaged luncheon meats. Salted nuts and seeds. Canned beans with salt.  Dairy Whole or 2% milk, cream, half-and-half, and cream cheese. Whole-fat or sweetened yogurt. Full-fat cheeses or blue cheese. Nondairy creamers and whipped toppings. Processed cheese, cheese spreads, or cheese curds.  Condiments Onion and garlic salt, seasoned salt, table salt, and sea salt. Canned and packaged gravies. Worcestershire sauce. Tartar sauce. Barbecue sauce. Teriyaki sauce. Soy sauce, including reduced sodium. Steak sauce. Fish sauce. Oyster sauce. Cocktail sauce. Horseradish. Ketchup and mustard. Meat flavorings and tenderizers. Bouillon cubes. Hot sauce. Tabasco sauce. Marinades. Taco seasonings. Relishes.  Fats and Oils Butter, stick margarine, lard, shortening, ghee, and bacon fat. Coconut, palm kernel, or palm oils. Regular salad dressings.  Other Pickles and olives. Salted popcorn and pretzels.  The items listed above may not be a complete list of foods and beverages to avoid. Contact your dietitian for more information.  WHERE CAN I FIND MORE INFORMATION? National Heart, Lung, and [employer] Institute: travelstabloid.com Document Released: 10/05/2011 Document Revised: 03/02/2014 Document Reviewed: 08/20/2013 [facility] Patient Information 2015 [place], [place]. This information is not intended to replace advice given to you by your health  care provider. Make sure you discuss any questions you have with your health care provider.   I think that you would greatly benefit from seeing a nutritionist.  If you are interested, please call Dr. Jenne Campus at [phone] to schedule an appointment.

## 2021-10-19 ENCOUNTER — Ambulatory Visit: Payer: No Typology Code available for payment source | Admitting: Neurology

## 2021-11-17 ENCOUNTER — Encounter: Payer: Self-pay | Admitting: Family Medicine

## 2021-11-17 ENCOUNTER — Ambulatory Visit: Payer: No Typology Code available for payment source | Admitting: Family Medicine

## 2021-11-17 VITALS — BP 152/92 | HR 94 | Temp 98.9°F | Ht 67.0 in | Wt 290.0 lb

## 2021-11-17 DIAGNOSIS — I10 Essential (primary) hypertension: Secondary | ICD-10-CM | POA: Diagnosis not present

## 2021-11-17 DIAGNOSIS — R55 Syncope and collapse: Secondary | ICD-10-CM | POA: Diagnosis not present

## 2021-11-17 DIAGNOSIS — R519 Headache, unspecified: Secondary | ICD-10-CM

## 2021-11-17 MED ORDER — LISINOPRIL 40 MG PO TABS
40.0000 mg | ORAL_TABLET | Freq: Every day | ORAL | 3 refills | Status: DC
Start: 2021-11-17 — End: 2022-03-09

## 2021-11-17 NOTE — Progress Notes (Addendum)
Subjective:  Patient ID: Margaret Schmitt, female    DOB: September 23, 1961, 61 y.o.   MRN: [id number]  Patient Care Team: Baruch Gouty, FNP as PCP - General (Family Medicine)   Chief Complaint:  Near Syncope (Single event 11/16/2021 PM)   HPI: Margaret Schmitt is a 61 y.o. female presenting on 11/17/2021 for Near Syncope (Single event 11/16/2021 PM)  Pt presents today for evaluation after a near syncopal episode yesterday. She recalls weakness, trembling, lightheadedness and headache occurring yesterday while grocery shopping.  Her headache remains today. She states she did not have a full meal or water intake prior to the episode yesterday.    Near Syncope Associated symptoms include headaches and weakness. Pertinent negatives include no abdominal pain, chest pain, chills, coughing, diaphoresis, fatigue, fever, nausea, numbness or vomiting. Arthralgias: cbc.    Relevant past medical, surgical, family, and social history reviewed and updated as indicated.  Allergies and medications reviewed and updated. Data reviewed: Chart in Epic.   Past Medical History:  Diagnosis Date   Asthma    Eczema    Hypertension    Hypertension    Hypothyroidism    Morbid obesity (HCC)    OSA (obstructive sleep apnea)    Thyroid cancer ([place])    Thyroid disease    hypothyroidism    Past Surgical History:  Procedure Laterality Date   ABDOMINAL HYSTERECTOMY     ADENOIDECTOMY     APPENDECTOMY     CHOLECYSTECTOMY     KIDNEY SURGERY     left ureter reimplantation   THYROID LOBECTOMY Left    TONSILLECTOMY      Social History   Socioeconomic History   Marital status: Single    Spouse name: Not on file   Number of children: Not on file   Years of education: Not on file   Highest education level: Not on file  Occupational History   Not on file  Tobacco Use   Smoking status: Former    Types: Cigarettes   Smokeless tobacco: Never  Vaping Use   Vaping Use: Never used  Substance and  Sexual Activity   Alcohol use: Yes    Comment: rarely   Drug use: Not Currently   Sexual activity: Not on file  Other Topics Concern   Not on file  Social History Narrative   Not on file   Social Determinants of Health   Financial Resource Strain: Not on file  Food Insecurity: Not on file  Transportation Needs: Not on file  Physical Activity: Not on file  Stress: Not on file  Social Connections: Not on file  Intimate Partner Violence: Not on file    Outpatient Encounter Medications as of 11/17/2021  Medication Sig   albuterol (VENTOLIN HFA) 108 (90 Base) MCG/ACT inhaler Inhale 2 puffs into the lungs every 6 (six) hours as needed for wheezing or shortness of breath.   EUTHYROX 175 MCG tablet TAKE 1 TABLET BY MOUTH ONCE DAILY BEFORE BREAKFAST   hydrochlorothiazide (HYDRODIURIL) 25 MG tablet Take 1 tablet by mouth once daily   ibuprofen (ADVIL,MOTRIN) 800 MG tablet Take 1 tablet (800 mg total) by mouth every 8 (eight) hours.   lisinopril (ZESTRIL) 20 MG tablet Take 1 tablet (20 mg total) by mouth daily.   Medium Chain Triglycerides (MCT OIL PO) Take 2,000 mg by mouth daily.   Multiple Vitamin (MULTIVITAMIN) tablet Take 1 tablet by mouth daily.   triamcinolone (NASACORT) 55 MCG/ACT AERO nasal inhaler Place 2  sprays into the nose daily.   No facility-administered encounter medications on file as of 11/17/2021.    Allergies  Allergen Reactions   Erythromycin Swelling    Review of Systems  Constitutional:  Negative for activity change, appetite change, chills, diaphoresis, fatigue, fever and unexpected weight change.  Respiratory:  Negative for cough and shortness of breath.   Cardiovascular:  Positive for near-syncope. Negative for chest pain and palpitations.  Gastrointestinal:  Negative for abdominal pain, nausea and vomiting.  Endocrine: Negative for polydipsia, polyphagia and polyuria.  Genitourinary:  Negative for decreased urine volume and difficulty urinating.   Musculoskeletal:  Arthralgias: cbc.  Neurological:  Positive for weakness, light-headedness and headaches. Negative for dizziness, tremors, seizures, syncope, facial asymmetry, speech difficulty and numbness.  Psychiatric/Behavioral:  Negative for confusion.        Objective:  BP (!) 152/92    Pulse 94    Temp 98.9 F (37.2 C)    Ht 5' 7" (1.702 m)    Wt 131.5 kg    SpO2 97%    BMI 45.42 kg/m    Wt Readings from Last 3 Encounters:  11/17/21 131.5 kg  10/04/21 131.1 kg  09/13/21 130.6 kg    Physical Exam Vitals and nursing note reviewed.  Constitutional:      General: She is not in acute distress.    Appearance: Normal appearance. She is obese. She is not ill-appearing, toxic-appearing or diaphoretic.  HENT:     Head: Normocephalic and atraumatic.     Right Ear: Tympanic membrane, ear canal and external ear normal.     Left Ear: Tympanic membrane, ear canal and external ear normal.     Mouth/Throat:     Mouth: Mucous membranes are moist.     Pharynx: Oropharynx is clear.  Eyes:     Conjunctiva/sclera: Conjunctivae normal.     Pupils: Pupils are equal, round, and reactive to light.  Cardiovascular:     Rate and Rhythm: Normal rate and regular rhythm.     Pulses: Normal pulses.  Pulmonary:     Effort: Pulmonary effort is normal.     Breath sounds: Normal breath sounds.  Abdominal:     General: Bowel sounds are normal. There is no distension.     Palpations: Abdomen is soft.     Tenderness: There is no abdominal tenderness.  Musculoskeletal:        General: Normal range of motion.     Cervical back: Normal range of motion.  Skin:    General: Skin is warm and dry.     Capillary Refill: Capillary refill takes less than 2 seconds.  Neurological:     General: No focal deficit present.     Mental Status: She is alert and oriented to person, place, and time.     Cranial Nerves: No cranial nerve deficit.     Sensory: No sensory deficit.     Motor: No weakness.      Coordination: Coordination normal.     Gait: Gait normal.     Deep Tendon Reflexes: Reflexes normal.  Psychiatric:        Mood and Affect: Mood normal.        Behavior: Behavior normal.        Thought Content: Thought content normal.        Judgment: Judgment normal.    Results for orders placed or performed in visit on 10/04/21  [facility]  Result Value Ref Range   Glucose 129 (H) 70 - 99  mg/dL   BUN 24 6 - 24 mg/dL   Creatinine, Ser 0.93 0.57 - 1.00 mg/dL   eGFR 71 >59 mL/min/1.73   BUN/Creatinine Ratio 26 (H) 9 - 23   Sodium 141 134 - 144 mmol/L   Potassium 4.7 3.5 - 5.2 mmol/L   Chloride 103 96 - 106 mmol/L   CO2 22 20 - 29 mmol/L   Calcium 9.6 8.7 - 10.2 mg/dL       Pertinent labs & imaging results that were available during my care of the patient were reviewed by me and considered in my medical decision making.  Assessment & Plan:  Margaret Schmitt was seen today for near syncope.  Diagnoses and all orders for this visit:  Near syncope Acute intractable headache, unspecified headache type Essential hypertension -Will increase lisinopril to 62m. Encouraged adequate intake of water and 3 meals/day. Will check CBC, BMP, and magnesium. Keep next appointment in March.     Continue all other maintenance medications.  Follow up plan: Return if symptoms worsen or fail to improve.   Continue healthy lifestyle choices, including diet (rich in fruits, vegetables, and lean proteins, and low in salt and simple carbohydrates) and exercise (at least 30 minutes of moderate physical activity daily).  Educational handout given for hypertension  The above assessment and management plan was discussed with the patient. The patient verbalized understanding of and has agreed to the management plan. Patient is aware to call the clinic if they develop any new symptoms or if symptoms persist or worsen. Patient is aware when to return to the clinic for a follow-up visit. Patient educated on when it  is appropriate to go to the emergency department.    Addison Pugh, RN, NP Student  I personally was present during the history, physical exam, and medical decision-making activities of this visit and have verified that the services and findings are accurately documented in the nurse practitioner student's note.  MMonia Pouch FNP-C [facility] [address] [phone]

## 2021-11-18 LAB — BASIC METABOLIC PANEL
BUN/Creatinine Ratio: 18 (ref 9–23)
BUN: 16 mg/dL (ref 6–24)
CO2: 25 mmol/L (ref 20–29)
Calcium: 9.5 mg/dL (ref 8.7–10.2)
Chloride: 103 mmol/L (ref 96–106)
Creatinine, Ser: 0.9 mg/dL (ref 0.57–1.00)
Glucose: 98 mg/dL (ref 70–99)
Potassium: 4.5 mmol/L (ref 3.5–5.2)
Sodium: 142 mmol/L (ref 134–144)
eGFR: 74 mL/min/{1.73_m2} (ref >59)

## 2021-11-18 LAB — CBC WITH DIFFERENTIAL/PLATELET
Basophils Absolute: 0.2 10*3/uL (ref 0.0–0.2)
Basos: 2 %
EOS (ABSOLUTE): 0.3 10*3/uL (ref 0.0–0.4)
Eos: 4 %
Hematocrit: 40.5 % (ref 34.0–46.6)
Hemoglobin: 13.5 g/dL (ref 11.1–15.9)
Immature Grans (Abs): 0 10*3/uL (ref 0.0–0.1)
Immature Granulocytes: 0 %
Lymphocytes Absolute: 1.9 10*3/uL (ref 0.7–3.1)
Lymphs: 23 %
MCH: 26.7 pg (ref 26.6–33.0)
MCHC: 33.3 g/dL (ref 31.5–35.7)
MCV: 80 fL (ref 79–97)
Monocytes Absolute: 0.6 10*3/uL (ref 0.1–0.9)
Monocytes: 7 %
Neutrophils Absolute: 5.3 10*3/uL (ref 1.4–7.0)
Neutrophils: 64 %
Platelets: 319 10*3/uL (ref 150–450)
RBC: 5.06 x10E6/uL (ref 3.77–5.28)
RDW: 14.9 % (ref 11.7–15.4)
WBC: 8.3 10*3/uL (ref 3.4–10.8)

## 2021-11-18 LAB — MAGNESIUM: Magnesium: 2.2 mg/dL (ref 1.6–2.3)

## 2021-12-07 ENCOUNTER — Encounter: Payer: Self-pay | Admitting: Family Medicine

## 2021-12-07 NOTE — Telephone Encounter (Signed)
Need to recheck thyroid panel with TSH and then will adjust medications if warranted.

## 2021-12-07 NOTE — Telephone Encounter (Signed)
Thyroid has not been rck since last lab please advise

## 2021-12-08 ENCOUNTER — Other Ambulatory Visit: Payer: Self-pay

## 2021-12-08 DIAGNOSIS — C73 Malignant neoplasm of thyroid gland: Secondary | ICD-10-CM

## 2022-01-03 ENCOUNTER — Encounter: Payer: Self-pay | Admitting: Family Medicine

## 2022-01-03 ENCOUNTER — Ambulatory Visit: Payer: No Typology Code available for payment source | Admitting: Family Medicine

## 2022-01-03 VITALS — BP 130/84 | HR 78 | Temp 97.3°F | Resp 20 | Ht 67.0 in | Wt 290.0 lb

## 2022-01-03 DIAGNOSIS — J339 Nasal polyp, unspecified: Secondary | ICD-10-CM

## 2022-01-03 DIAGNOSIS — E890 Postprocedural hypothyroidism: Secondary | ICD-10-CM

## 2022-01-03 DIAGNOSIS — Z85850 Personal history of malignant neoplasm of thyroid: Secondary | ICD-10-CM | POA: Insufficient documentation

## 2022-01-03 DIAGNOSIS — J0111 Acute recurrent frontal sinusitis: Secondary | ICD-10-CM

## 2022-01-03 DIAGNOSIS — I10 Essential (primary) hypertension: Secondary | ICD-10-CM

## 2022-01-03 DIAGNOSIS — R439 Unspecified disturbances of smell and taste: Secondary | ICD-10-CM

## 2022-01-03 MED ORDER — AMOXICILLIN-POT CLAVULANATE 875-125 MG PO TABS: 1.0000 | ORAL_TABLET | Freq: Two times a day (BID) | ORAL | 0 refills | Status: AC

## 2022-01-03 NOTE — Patient Instructions (Signed)
The 4 Principles of Healthy Living  1. Do Not Smoke.  2. Maintain a BMI<30.  3. Exercise 150 minutes/week. (30 minutes 5 days a week of some form of aerobic exercise)  4. Eat 5 servings of fruits or vegetables daily.   Several studies have conclusively shown that individuals who do these things have a dramatic reduction in overall mortality, heart disease, diabetes, hypertension, stroke, congestive heart failure, and cancer. This means that without taking any pills, vitamins, tonics, etc - without spending a single penny - you can live a longer, healthier, more productive life. Let's look at these 4 principles separately.   1. Do Not Smoke - Make up your mind to quit, talk with your doctor to formulate a plan, and set a date.  2. Get to and maintain a BMI<30. This gets you out of the obese category. No longer being obese will dramatically reduce you and your family's risk of a multitude of health problems. It is not easy, but, it is possible. If you are extremely obese it may take years, but, each step you take will lead to dramatic rewards. With just 20 pounds of weight loss most people feel better, have less fatigue and joint pain, and feel more energetic. If you have health problems like diabetes, hypertension, or high cholesterol you might do away with your need for some medications. And most of all, while you change you lifestyle to attain this goal you will set a good example for all those around you, especially your children. Here are two proven steps to help you start losing weight. ? Portion Control - Eating your meals on a smaller plate and limiting the amount of calories you eat at each meal has been shown to lead to weight loss. The average plate is 10 inches in diameter. A 10 inch plate piled high with food can add up to 1500 calories (even more if you go back for seconds). The typical man needs 2000 calories per day total. Try using a smaller plate (8 inch paper plate or 7 inch saucer)  at each meal and NEVER go back for second helpings. ? Pedometer - individuals who wear a pedometer and try to walk 10,000 steps each day increase their physical activity, lose weight, and decrease their blood pressures.  3. Exercise 150 minutes/week. The overall health benefits of regular aerobic exercise are overwhelming:  Reduces the risk of dying prematurely. Reduces the risk of dying from heart disease.  Reduces the risk of stroke.  Reduces the risk of developing diabetes.  Reduces the risk of developing high blood pressure.  Helps reduce blood pressure in people who already have high blood pressure.  Reduces the risk of developing colon cancer.  Reduces feelings of depression and anxiety.  Helps control weight.  Helps build and maintain healthy bones, muscles and joints.  Helps older adults become stronger and better able to move about without falling.  Promotes psychological well-being.  If you have health problems or are over age 60 we recommend consulting your doctor before beginning. We recommend starting slow and working up. Start by reading the Healthnote on Starting an Exercise Program then get going. Do not over think the process. Pick something simple at home like walking with friends, using a treadmill, or riding an exercise bike. Experiment with different types of exercise until you find something you can tolerate (it does not have to be fun). Pick a 30 minute disc of inspirational music and listen to it while you exercise.   The more you do it the easier it will become and the better you will feel.  4. Eat 5 servings of fruits or vegetables daily.  A serving size is: One medium-size fruit  1/2 cup raw, cooked, frozen or canned fruits (in 100% juice) or vegetables  3/4 cup (6 oz.) 100% fruit or vegetable juice  1 cup raw, leafy vegetables  1/4 cup dried fruit   While this sounds easy enough actually getting this much fruits and vegetables takes some work and planning. You  will need to experiment with different types of fruits and vegetables to find ones you and your family can eat every day. Some simple tips include:  - Add fruit to your cereal each morning. - Eat a salad each day for lunch. The typical bowl of salad counts for 2 servings of vegetables.  - Have some fruits and vegetables at every meal. Use canned or frozen products if needed. - Eat fruits and vegetables for snacks - especially for the kids. - Replace the side of fries or chips with a cup of fruit, an apple, or a bowl of celery.  What are the benefits? Reduces heart disease and stroke. Possible reduction in cancer risk. Protects against the development of diabetes. Filling up on fat free fruits and vegetable decreases the amount of high fat foods you will eat, aiding in weight loss.  

## 2022-01-03 NOTE — Progress Notes (Signed)
Subjective:  Patient ID: Margaret Schmitt, female    DOB: Apr 04, 1961, 61 y.o.   MRN: [id number]  Patient Care Team: Baruch Gouty, FNP as PCP - General (Family Medicine)   Chief Complaint:  Hypertension and Irritable Bowel Syndrome (Wants FMLA for this )   HPI: Margaret Schmitt is a 61 y.o. female presenting on 01/03/2022 for Hypertension and Irritable Bowel Syndrome (Wants FMLA for this )   1. Essential hypertension Taking medications as prescribed with good control of BP. Denies chest pain, palpitations, leg swelling, shortness of breath, headaches, weakness, or confusion.   2. Postoperative hypothyroidism History of thyroid cancer post partial thyroidectomy. Last TSH was high and medications were adjusted. Will repeat labs today and adjust further if warranted. Has not had follow up thyroid US in years.   3. Morbid obesity ([place]) Does try to stay active. Has recently switched to modified keto diet and has lost 10 lbs according to home scales.   4. Abnormal smell States ongoing for several months. Has noticed some postnasal drainage at times and trouble breathing out of left nostril. No fever, chills, headaches, dizziness, fatigue, or weakness.     Relevant past medical, surgical, family, and social history reviewed and updated as indicated.  Allergies and medications reviewed and updated. Data reviewed: Chart in Epic.   Past Medical History:  Diagnosis Date   Asthma    Eczema    Hypertension    Hypertension    Hypothyroidism    Morbid obesity (HCC)    OSA (obstructive sleep apnea)    Thyroid cancer ([place])    Thyroid disease    hypothyroidism    Past Surgical History:  Procedure Laterality Date   ABDOMINAL HYSTERECTOMY     ADENOIDECTOMY     APPENDECTOMY     CHOLECYSTECTOMY     KIDNEY SURGERY     left ureter reimplantation   THYROID LOBECTOMY Left    TONSILLECTOMY      Social History   Socioeconomic History   Marital status: Single    Spouse name: Not on  file   Number of children: Not on file   Years of education: Not on file   Highest education level: Not on file  Occupational History   Not on file  Tobacco Use   Smoking status: Former    Types: Cigarettes   Smokeless tobacco: Never  Vaping Use   Vaping Use: Never used  Substance and Sexual Activity   Alcohol use: Yes    Comment: rarely   Drug use: Not Currently   Sexual activity: Not on file  Other Topics Concern   Not on file  Social History Narrative   Not on file   Social Determinants of Health   Financial Resource Strain: Not on file  Food Insecurity: Not on file  Transportation Needs: Not on file  Physical Activity: Not on file  Stress: Not on file  Social Connections: Not on file  Intimate Partner Violence: Not on file    Outpatient Encounter Medications as of 01/03/2022  Medication Sig   albuterol (VENTOLIN HFA) 108 (90 Base) MCG/ACT inhaler Inhale 2 puffs into the lungs every 6 (six) hours as needed for wheezing or shortness of breath.   amoxicillin-clavulanate (AUGMENTIN) 875-125 MG tablet Take 1 tablet by mouth 2 (two) times daily for 10 days.   EUTHYROX 175 MCG tablet TAKE 1 TABLET BY MOUTH ONCE DAILY BEFORE BREAKFAST   hydrochlorothiazide (HYDRODIURIL) 25 MG tablet Take 1 tablet by  mouth once daily   ibuprofen (ADVIL,MOTRIN) 800 MG tablet Take 1 tablet (800 mg total) by mouth every 8 (eight) hours.   lisinopril (ZESTRIL) 40 MG tablet Take 1 tablet (40 mg total) by mouth daily.   Medium Chain Triglycerides (MCT OIL PO) Take 2,000 mg by mouth daily.   triamcinolone (NASACORT) 55 MCG/ACT AERO nasal inhaler Place 2 sprays into the nose daily.   [DISCONTINUED] Multiple Vitamin (MULTIVITAMIN) tablet Take 1 tablet by mouth daily. (Patient not taking: Reported on 01/03/2022)   No facility-administered encounter medications on file as of 01/03/2022.    Allergies  Allergen Reactions   Erythromycin Swelling    Review of Systems  Constitutional:  Negative for  activity change, appetite change, chills, diaphoresis, fatigue, fever and unexpected weight change.  HENT:  Positive for sinus pressure and sinus pain. Negative for congestion.        Reports ongoing "bad smell"  Eyes:  Negative for photophobia and visual disturbance.  Respiratory:  Negative for cough.   Cardiovascular:  Negative for leg swelling.  Gastrointestinal:  Negative for abdominal pain.  Genitourinary:  Negative for decreased urine volume and difficulty urinating.  Neurological:  Negative for dizziness, tremors, seizures, syncope, facial asymmetry, speech difficulty, weakness, light-headedness and numbness.  Psychiatric/Behavioral:  Negative for confusion.        Objective:  BP 130/84    Pulse 78    Temp (!) 97.3 F (36.3 C)    Resp 20    Ht '5\' 7"'  (1.702 m)    Wt 290 lb (131.5 kg)    SpO2 96%    BMI 45.42 kg/m    Wt Readings from Last 3 Encounters:  01/03/22 290 lb (131.5 kg)  11/17/21 290 lb (131.5 kg)  10/04/21 289 lb (131.1 kg)    Physical Exam Vitals and nursing note reviewed.  Constitutional:      General: She is not in acute distress.    Appearance: Normal appearance. She is well-developed and well-groomed. She is obese. She is not ill-appearing, toxic-appearing or diaphoretic.  HENT:     Head: Normocephalic and atraumatic.     Jaw: There is normal jaw occlusion.     Right Ear: Hearing normal. There is impacted cerumen.     Left Ear: Hearing normal. There is impacted cerumen.     Nose:     Right Sinus: Frontal sinus tenderness present.     Left Sinus: Frontal sinus tenderness present.     Comments: Left nasal polyp    Mouth/Throat:     Lips: Pink.     Mouth: Mucous membranes are moist.     Pharynx: Oropharynx is clear. Uvula midline.  Eyes:     General: Lids are normal.     Extraocular Movements: Extraocular movements intact.     Conjunctiva/sclera: Conjunctivae normal.     Pupils: Pupils are equal, round, and reactive to light.  Neck:     Thyroid: No  thyroid mass, thyromegaly or thyroid tenderness.     Vascular: No carotid bruit or JVD.     Trachea: Trachea and phonation normal.  Cardiovascular:     Rate and Rhythm: Normal rate and regular rhythm.     Chest Wall: PMI is not displaced.     Pulses: Normal pulses.     Heart sounds: Normal heart sounds. No murmur heard.   No friction rub. No gallop.  Pulmonary:     Effort: Pulmonary effort is normal. No respiratory distress.     Breath sounds: Normal  breath sounds. No wheezing.  Abdominal:     General: Bowel sounds are normal. There is no distension or abdominal bruit.     Palpations: Abdomen is soft. There is no hepatomegaly or splenomegaly.     Tenderness: There is no abdominal tenderness. There is no right CVA tenderness or left CVA tenderness.     Hernia: No hernia is present.  Musculoskeletal:        General: Normal range of motion.     Cervical back: Normal range of motion and neck supple.     Right lower leg: No edema.     Left lower leg: No edema.  Lymphadenopathy:     Cervical: No cervical adenopathy.  Skin:    General: Skin is warm and dry.     Capillary Refill: Capillary refill takes less than 2 seconds.     Coloration: Skin is not cyanotic, jaundiced or pale.     Findings: No rash.  Neurological:     General: No focal deficit present.     Mental Status: She is alert and oriented to person, place, and time.     Sensory: Sensation is intact.     Motor: Motor function is intact.     Coordination: Coordination is intact.     Gait: Gait is intact.     Deep Tendon Reflexes: Reflexes are normal and symmetric.  Psychiatric:        Attention and Perception: Attention and perception normal.        Mood and Affect: Mood and affect normal.        Speech: Speech normal.        Behavior: Behavior normal. Behavior is cooperative.        Thought Content: Thought content normal.        Cognition and Memory: Cognition and memory normal.        Judgment: Judgment normal.     Results for orders placed or performed in visit on 38/25/05  Basic Metabolic Panel  Result Value Ref Range   Glucose 98 70 - 99 mg/dL   BUN 16 6 - 24 mg/dL   Creatinine, Ser 0.90 0.57 - 1.00 mg/dL   eGFR 74 >59 mL/min/1.73   BUN/Creatinine Ratio 18 9 - 23   Sodium 142 134 - 144 mmol/L   Potassium 4.5 3.5 - 5.2 mmol/L   Chloride 103 96 - 106 mmol/L   CO2 25 20 - 29 mmol/L   Calcium 9.5 8.7 - 10.2 mg/dL  Magnesium  Result Value Ref Range   Magnesium 2.2 1.6 - 2.3 mg/dL  CBC with Differential  Result Value Ref Range   WBC 8.3 3.4 - 10.8 x10E3/uL   RBC 5.06 3.77 - 5.28 x10E6/uL   Hemoglobin 13.5 11.1 - 15.9 g/dL   Hematocrit 40.5 34.0 - 46.6 %   MCV 80 79 - 97 fL   MCH 26.7 26.6 - 33.0 pg   MCHC 33.3 31.5 - 35.7 g/dL   RDW 14.9 11.7 - 15.4 %   Platelets 319 150 - 450 x10E3/uL   Neutrophils 64 Not Estab. %   Lymphs 23 Not Estab. %   Monocytes 7 Not Estab. %   Eos 4 Not Estab. %   Basos 2 Not Estab. %   Neutrophils Absolute 5.3 1.4 - 7.0 x10E3/uL   Lymphocytes Absolute 1.9 0.7 - 3.1 x10E3/uL   Monocytes Absolute 0.6 0.1 - 0.9 x10E3/uL   EOS (ABSOLUTE) 0.3 0.0 - 0.4 x10E3/uL   Basophils Absolute 0.2 0.0 -  0.2 x10E3/uL   Immature Granulocytes 0 Not Estab. %   Immature Grans (Abs) 0.0 0.0 - 0.1 x10E3/uL       Pertinent labs & imaging results that were available during my care of the patient were reviewed by me and considered in my medical decision making.  Assessment & Plan:  Ladeidra was seen today for hypertension and irritable bowel syndrome.  Diagnoses and all orders for this visit:  Essential hypertension BP well controlled. Changes were not made in regimen today. Goal BP is 130/80. Pt aware to report any persistent high or low readings. DASH diet and exercise encouraged. Exercise at least 150 minutes per week and increase as tolerated. Goal BMI > 25. Stress management encouraged. Avoid nicotine and tobacco product use. Avoid excessive alcohol and NSAID's. Avoid more  than 2000 mg of sodium daily. Medications as prescribed. Follow up as scheduled.   Postoperative hypothyroidism History of thyroid cancer Will recheck labs and adjust repletion therapy as warranted. Will repeat thyroid US as pt has not had in years.  -     Thyroid Panel With TSH -     US THYROID; Future -     T3, Free  Morbid obesity (HCC) Diet and exercise encouraged.  -     Thyroid Panel With TSH  Left nasal polyps Olfactory impairment Large left nasal polyp and ongoing altered smell. Will refer to ENT.  -     Ambulatory referral to ENT  Acute recurrent frontal sinusitis Will treat with Augmentin. Continue nasal spray. Symptomatic care discussed in detail. Referral to ENT placed.  -     Ambulatory referral to ENT -     amoxicillin-clavulanate (AUGMENTIN) 875-125 MG tablet; Take 1 tablet by mouth 2 (two) times daily for 10 days.      Continue all other maintenance medications.  Follow up plan: Return in about 3 months (around 04/05/2022), or if symptoms worsen or fail to improve, for Chronic follow up.   Continue healthy lifestyle choices, including diet (rich in fruits, vegetables, and lean proteins, and low in salt and simple carbohydrates) and exercise (at least 30 minutes of moderate physical activity daily).  Educational handout given for health living  The above assessment and management plan was discussed with the patient. The patient verbalized understanding of and has agreed to the management plan. Patient is aware to call the clinic if they develop any new symptoms or if symptoms persist or worsen. Patient is aware when to return to the clinic for a follow-up visit. Patient educated on when it is appropriate to go to the emergency department.   Monia Pouch, FNP-C [facility] [phone]

## 2022-01-04 LAB — THYROID PANEL WITH TSH
Free Thyroxine Index: 1.9 (ref 1.2–4.9)
T3 Uptake Ratio: 27 % (ref 24–39)
T4, Total: 7.1 ug/dL (ref 4.5–12.0)
TSH: 6.59 u[IU]/mL — ABNORMAL HIGH (ref 0.450–4.500)

## 2022-01-04 LAB — T3, FREE: T3, Free: 2.9 pg/mL (ref 2.0–4.4)

## 2022-01-04 MED ORDER — LEVOTHYROXINE SODIUM 200 MCG PO TABS: 200.0000 ug | ORAL_TABLET | Freq: Every day | ORAL | 3 refills | Status: DC

## 2022-01-04 NOTE — Addendum Note (Signed)
Addended by: Baruch Gouty on: 01/04/2022 08:28 AM ? ? Modules accepted: Orders ? ?

## 2022-01-11 ENCOUNTER — Other Ambulatory Visit: Payer: Self-pay

## 2022-01-11 ENCOUNTER — Ambulatory Visit (HOSPITAL_COMMUNITY)
Admission: RE | Admit: 2022-01-11 | Discharge: 2022-01-11 | Disposition: A | Discharge status: Home / Self Care | Payer: No Typology Code available for payment source | Source: Ambulatory Visit | Attending: Family Medicine | Admitting: Family Medicine

## 2022-01-11 DIAGNOSIS — E890 Postprocedural hypothyroidism: Secondary | ICD-10-CM | POA: Diagnosis not present

## 2022-01-11 DIAGNOSIS — Z85850 Personal history of malignant neoplasm of thyroid: Secondary | ICD-10-CM | POA: Insufficient documentation

## 2022-02-05 ENCOUNTER — Other Ambulatory Visit: Payer: Self-pay

## 2022-02-05 ENCOUNTER — Emergency Department (HOSPITAL_COMMUNITY)
Admission: EM | Admit: 2022-02-05 | Discharge: 2022-02-05 | Disposition: A | Discharge status: Home / Self Care | Payer: No Typology Code available for payment source | Attending: Emergency Medicine | Admitting: Emergency Medicine

## 2022-02-05 ENCOUNTER — Emergency Department (HOSPITAL_COMMUNITY): Payer: No Typology Code available for payment source

## 2022-02-05 ENCOUNTER — Encounter (HOSPITAL_COMMUNITY): Payer: Self-pay

## 2022-02-05 DIAGNOSIS — E876 Hypokalemia: Secondary | ICD-10-CM

## 2022-02-05 DIAGNOSIS — R5381 Other malaise: Secondary | ICD-10-CM | POA: Insufficient documentation

## 2022-02-05 DIAGNOSIS — R531 Weakness: Secondary | ICD-10-CM | POA: Insufficient documentation

## 2022-02-05 DIAGNOSIS — I10 Essential (primary) hypertension: Secondary | ICD-10-CM | POA: Diagnosis not present

## 2022-02-05 DIAGNOSIS — I4891 Unspecified atrial fibrillation: Secondary | ICD-10-CM | POA: Diagnosis not present

## 2022-02-05 DIAGNOSIS — R002 Palpitations: Secondary | ICD-10-CM | POA: Diagnosis present

## 2022-02-05 DIAGNOSIS — Z79899 Other long term (current) drug therapy: Secondary | ICD-10-CM | POA: Insufficient documentation

## 2022-02-05 DIAGNOSIS — E039 Hypothyroidism, unspecified: Secondary | ICD-10-CM | POA: Diagnosis not present

## 2022-02-05 DIAGNOSIS — R42 Dizziness and giddiness: Secondary | ICD-10-CM | POA: Diagnosis not present

## 2022-02-05 LAB — CBC
HCT: 42.4 % (ref 36.0–46.0)
Hemoglobin: 13.7 g/dL (ref 12.0–15.0)
MCH: 26 pg (ref 26.0–34.0)
MCHC: 32.3 g/dL (ref 30.0–36.0)
MCV: 80.5 fL (ref 80.0–100.0)
Platelets: 211 10*3/uL (ref 150–400)
RBC: 5.27 MIL/uL — ABNORMAL HIGH (ref 3.87–5.11)
RDW: 15 % (ref 11.5–15.5)
WBC: 8.8 10*3/uL (ref 4.0–10.5)
nRBC: 0 % (ref 0.0–0.2)

## 2022-02-05 LAB — BASIC METABOLIC PANEL
Anion gap: 11 (ref 5–15)
BUN: 28 mg/dL — ABNORMAL HIGH (ref 6–20)
CO2: 21 mmol/L — ABNORMAL LOW (ref 22–32)
Calcium: 8.8 mg/dL — ABNORMAL LOW (ref 8.9–10.3)
Chloride: 106 mmol/L (ref 98–111)
Creatinine, Ser: 1.01 mg/dL — ABNORMAL HIGH (ref 0.44–1.00)
GFR, Estimated: >60 mL/min (ref >60)
Glucose, Bld: 126 mg/dL — ABNORMAL HIGH (ref 70–99)
Potassium: 3.3 mmol/L — ABNORMAL LOW (ref 3.5–5.1)
Sodium: 138 mmol/L (ref 135–145)

## 2022-02-05 MED ORDER — DILTIAZEM LOAD VIA INFUSION
20.0000 mg | Freq: Once | INTRAVENOUS | Status: AC
  Administered 2022-02-05: 20 mg via INTRAVENOUS
  Filled 2022-02-05: qty 20

## 2022-02-05 MED ORDER — POTASSIUM CHLORIDE CRYS ER 20 MEQ PO TBCR: 20.0000 meq | EXTENDED_RELEASE_TABLET | Freq: Every day | ORAL | 0 refills | Status: DC

## 2022-02-05 MED ORDER — POTASSIUM CHLORIDE CRYS ER 20 MEQ PO TBCR
40.0000 meq | EXTENDED_RELEASE_TABLET | Freq: Once | ORAL | Status: AC
  Administered 2022-02-05: 40 meq via ORAL
  Filled 2022-02-05: qty 2

## 2022-02-05 MED ORDER — SODIUM CHLORIDE 0.9 % IV BOLUS
1000.0000 mL | Freq: Once | INTRAVENOUS | Status: AC
  Administered 2022-02-05: 1000 mL via INTRAVENOUS

## 2022-02-05 MED ORDER — DILTIAZEM HCL-DEXTROSE 125-5 MG/125ML-% IV SOLN (PREMIX)
5.0000 mg/h | INTRAVENOUS | Status: DC
  Administered 2022-02-05: 5 mg/h via INTRAVENOUS
  Filled 2022-02-05: qty 125

## 2022-02-05 NOTE — ED Triage Notes (Signed)
Patient via EMS due to SVT. EMS had patient try vagal maneuvers with success. Heart rate upon arrival 110 in afib.  ?

## 2022-02-05 NOTE — Discharge Instructions (Addendum)
You were in atrial fibrillation with rapid rate as well as a period of sinus tachycardia.  It is not clear what caused this.  Your potassium is slightly low so we are starting you on potassium.  This is probably related to the hydrochlorothiazide that you take.  Call the cardiology office for a follow-up appointment for further care and treatment as soon as possible.  Return here, if needed for problems. ?

## 2022-02-05 NOTE — ED Provider Notes (Signed)
?[facility] ?Provider Note ? ? ?CSN: [id number] ?Arrival date & time: 02/05/22  1304 ? ?  ? ?History ? ?Chief Complaint  ?Patient presents with  ? Tachycardia  ? ? ?Margaret Schmitt is a 61 y.o. female. ? ?HPI ?Patient presents for evaluation of palpitations.  EMS transferred her here and they felt that she was in atrial fibrillation and improved with "vagal maneuvers."  Patient states she called them because she was feeling weak and dizzy.  She denies recent illnesses including nausea, vomiting, cough or shortness of breath.  No prior similar problems. ?  ? ?Home Medications ?Prior to Admission medications   ?Medication Sig Start Date End Date Taking? Authorizing Provider  ?potassium chloride SA (KLOR-CON M) 20 MEQ tablet Take 1 tablet (20 mEq total) by mouth daily. 02/05/22  Yes Daleen Bo, MD  ?albuterol (VENTOLIN HFA) 108 (90 Base) MCG/ACT inhaler Inhale 2 puffs into the lungs every 6 (six) hours as needed for wheezing or shortness of breath. 07/16/20   Isaac Bliss, Rayford Halsted, MD  ?hydrochlorothiazide (HYDRODIURIL) 25 MG tablet Take 1 tablet by mouth once daily 08/05/21   Isaac Bliss, Rayford Halsted, MD  ?ibuprofen (ADVIL,MOTRIN) 800 MG tablet Take 1 tablet (800 mg total) by mouth every 8 (eight) hours. 08/22/18   Soyla Dryer, PA-C  ?levothyroxine (EUTHYROX) 200 MCG tablet Take 1 tablet (200 mcg total) by mouth daily before breakfast. 01/04/22   Rakes, Connye Burkitt, FNP  ?lisinopril (ZESTRIL) 40 MG tablet Take 1 tablet (40 mg total) by mouth daily. 11/17/21   Baruch Gouty, FNP  ?Medium Chain Triglycerides (MCT OIL PO) Take 2,000 mg by mouth daily.    [provider]  ?triamcinolone (NASACORT) 55 MCG/ACT AERO nasal inhaler Place 2 sprays into the nose daily.    [provider]  ?   ? ?Allergies    ?Erythromycin   ? ?Review of Systems   ?Review of Systems ? ?Physical Exam ?Updated Vital Signs ?BP (!) 145/91   Pulse 84   Temp 98.7 ?F (37.1 ?C) (Oral)   Resp 18   Ht '5\' 7"'$   (1.702 m)   Wt 125.6 kg   SpO2 96%   BMI 43.38 kg/m?  ?Physical Exam ?Vitals and nursing note reviewed.  ?Constitutional:   ?   Appearance: She is well-developed. She is not ill-appearing.  ?HENT:  ?   Head: Normocephalic and atraumatic.  ?   Right Ear: External ear normal.  ?   Left Ear: External ear normal.  ?Eyes:  ?   Conjunctiva/sclera: Conjunctivae normal.  ?   Pupils: Pupils are equal, round, and reactive to light.  ?Neck:  ?   Trachea: Phonation normal.  ?Cardiovascular:  ?   Rate and Rhythm: Tachycardia present. Rhythm irregular.  ?   Heart sounds: Normal heart sounds.  ?Pulmonary:  ?   Effort: Pulmonary effort is normal. No respiratory distress.  ?   Breath sounds: Normal breath sounds. No stridor.  ?Abdominal:  ?   General: There is no distension.  ?   Palpations: Abdomen is soft.  ?   Tenderness: There is no abdominal tenderness.  ?Musculoskeletal:     ?   General: Normal range of motion.  ?   Cervical back: Normal range of motion and neck supple.  ?Skin: ?   General: Skin is warm and dry.  ?Neurological:  ?   Mental Status: She is alert and oriented to person, place, and time.  ?   Cranial Nerves: No  cranial nerve deficit.  ?   Sensory: No sensory deficit.  ?   Motor: No abnormal muscle tone.  ?   Coordination: Coordination normal.  ?Psychiatric:     ?   Mood and Affect: Mood normal.     ?   Behavior: Behavior normal.     ?   Thought Content: Thought content normal.     ?   Judgment: Judgment normal.  ? ? ?ED Results / Procedures / Treatments   ?Labs ?(all labs ordered are listed, but only abnormal results are displayed) ?Labs Reviewed  ?BASIC METABOLIC PANEL - Abnormal; Notable for the following components:  ?    Result Value  ? Potassium 3.3 (*)   ? CO2 21 (*)   ? Glucose, Bld 126 (*)   ? BUN 28 (*)   ? Creatinine, Ser 1.01 (*)   ? Calcium 8.8 (*)   ? All other components within normal limits  ?CBC - Abnormal; Notable for the following components:  ? RBC 5.27 (*)   ? All other components within  normal limits  ? ? ?EKG ?EKG Interpretation ? ?Date/Time:  Sunday February 05 2022 13:08:12 EDT ?Ventricular Rate:  167 ?PR Interval:    ?QRS Duration: 101 ?QT Interval:  303 ?QTC Calculation: 510 ?R Axis:   75 ?Text Interpretation: Atrial fibrillation with rapid V-rate Ventricular tachycardia, unsustained Repolarization abnormality, prob rate related No old tracing to compare Confirmed by Daleen Bo [phone]) on 02/05/2022 2:22:08 PM ? ? EKG Interpretation ? ?Date/Time:  Sunday February 05 2022 13:52:01 EDT ?Ventricular Rate:  97 ?PR Interval:  175 ?QRS Duration: 98 ?QT Interval:  351 ?QTC Calculation: 446 ?R Axis:   69 ?Text Interpretation: Sinus tachycardia with PAC and aberrrant ventricular conduction Paired ventricular premature complexes Since last tracing Borderline T abnormalities, inferior leads Rate slower and now in sinus rhythm Reconfirmed by Daleen Bo [phone]) on 02/05/2022 2:59:04 PM ?  ? ?  ? ? ? ? ? ?Radiology ?DG Chest Port 1 View ? ?Result Date: 02/05/2022 ?CLINICAL DATA:  Shortness of breath. EXAM: PORTABLE CHEST 1 VIEW COMPARISON:  None. FINDINGS: 1324 hours. The lungs are clear without focal pneumonia, edema, pneumothorax or pleural effusion. Trace atelectasis or linear scarring at the left base. The cardiopericardial silhouette is within normal limits for size. The visualized bony structures of the thorax are unremarkable. Telemetry leads overlie the chest. IMPRESSION: No active disease. Electronically Signed   By: Misty Stanley M.D.   On: 02/05/2022 13:31   ? ?Procedures ?Procedures  ? ? ?Medications Ordered in ED ?Medications  ?diltiazem (CARDIZEM) 1 mg/mL load via infusion 20 mg (20 mg Intravenous Bolus from Bag 02/05/22 1405)  ?  And  ?diltiazem (CARDIZEM) 125 mg in dextrose 5% 125 mL (1 mg/mL) infusion (0 mg/hr Intravenous Stopped 02/05/22 1607)  ?potassium chloride SA (KLOR-CON M) CR tablet 40 mEq (has no administration in time range)  ?sodium chloride 0.9 % bolus 1,000 mL (0 mLs Intravenous  Stopped 02/05/22 1513)  ? ? ?ED Course/ Medical Decision Making/ A&P ?  ?                        ?Medical Decision Making ?Patient presents for evaluation of palpitations and malaise, onset today.  No prior similar problems. ? ?Amount and/or Complexity of Data Reviewed ?Independent Historian:  ?   Details: She is a cogent historian ?External Data Reviewed: notes. ?   Details: EMS notes from transfer, including monitor strips indicating  atrial fibrillation with RVR. ?Labs: ordered. ?Radiology: ordered. ?   Details: CBC, metabolic panel-normal except potassium slightly low ?ECG/medicine tests: ordered and independent interpretation performed. ?   Details: Cardiac monitor-atrial fibrillation with rapid ventricular response ? ?Risk ?Prescription drug management. ?Decision regarding hospitalization. ?Risk Details: Patient presented with tachyarrhythmia which was symptomatic.  She did not have syncope.  She has no prior similar problem.  She appeared to be in atrial fibrillation with rapid response on arrival.  She had a period of sinus tachycardia with irregularity, rule out atrial flutter.  There is no significant ventricular arrhythmia  She has mild hypokalemia but no other metabolic abnormality.  She recovered normal sinus rhythm after treatment with Cardizem.  She remained stable after the Cardizem was discontinued.  She is stable for discharge with symptomatic treatment and follow-up with cardiology.  Doubt ACS.  No evidence for acute neurologic abnormality. ? ?Critical Care ?Total time providing critical care: 45 minutes ? ? ? ? ? ? ? ? ? ?Final Clinical Impression(s) / ED Diagnoses ?Final diagnoses:  ?Atrial fibrillation with RVR ([place])  ?Hypokalemia  ? ? ?Rx / DC Orders ?ED Discharge Orders   ? ?      Ordered  ?  potassium chloride SA (KLOR-CON M) 20 MEQ tablet  Daily       ? 02/05/22 1629  ? ?  ?  ? ?  ? ? ?  ?Daleen Bo, MD ?02/06/22 1827 ? ?

## 2022-02-09 ENCOUNTER — Other Ambulatory Visit: Payer: Self-pay | Admitting: Family Medicine

## 2022-02-09 ENCOUNTER — Encounter: Payer: Self-pay | Admitting: Family Medicine

## 2022-02-09 DIAGNOSIS — E876 Hypokalemia: Secondary | ICD-10-CM

## 2022-02-09 MED ORDER — POTASSIUM CHLORIDE CRYS ER 20 MEQ PO TBCR: 20.0000 meq | EXTENDED_RELEASE_TABLET | Freq: Every day | ORAL | 0 refills | Status: DC

## 2022-03-09 ENCOUNTER — Ambulatory Visit (INDEPENDENT_AMBULATORY_CARE_PROVIDER_SITE_OTHER): Payer: No Typology Code available for payment source | Admitting: Cardiology

## 2022-03-09 ENCOUNTER — Encounter: Payer: Self-pay | Admitting: Cardiology

## 2022-03-09 VITALS — BP 156/100 | HR 80 | Ht 67.0 in | Wt 271.2 lb

## 2022-03-09 DIAGNOSIS — I471 Supraventricular tachycardia: Secondary | ICD-10-CM | POA: Diagnosis not present

## 2022-03-09 DIAGNOSIS — E876 Hypokalemia: Secondary | ICD-10-CM | POA: Diagnosis not present

## 2022-03-09 DIAGNOSIS — I10 Essential (primary) hypertension: Secondary | ICD-10-CM

## 2022-03-09 MED ORDER — LISINOPRIL 40 MG PO TABS: 40.0000 mg | ORAL_TABLET | Freq: Every day | ORAL | 3 refills | Status: DC

## 2022-03-09 MED ORDER — TRIAMTERENE-HCTZ 37.5-25 MG PO TABS: 1.0000 | ORAL_TABLET | Freq: Every day | ORAL | 1 refills | Status: DC

## 2022-03-09 NOTE — Progress Notes (Signed)
? ? ? ?Clinical Summary ?Ms. Rozier is a 61 y.o.female seen today as a new consult, referred by NP Rakes for the following medical problems.  ? ?Tachycardia ?ER visit with tachycardia ?- computer interpretation of EKG was afib, on my review looks more like MAT ?- K was low 3.3, AKI ?- sudden onset of fluttering in chest. Has had before but usually short in duration. This episode was prolonged, +weakness. Mild SOB.  ?- 1-2 coffees per day, 1-2 sodas but has cut back, no EtOH ? ?- reports prior workup for palpitations showed only PVCs over 10 years ago.  ? ?2. Hypokalemia ?- she is on HCTZ at home. ? ?3. Chronic LE edema ?- she is on HCTZ.  ? ?4. HTN ?- ran out of lisinopril ? ?SH: works TEFL teacher, monitors inpatient telemetry ? ? ? ?Past Medical History:  ?Diagnosis Date  ? Asthma   ? Eczema   ? Hypertension   ? Hypertension   ? Hypothyroidism   ? Morbid obesity ([place])   ? OSA (obstructive sleep apnea)   ? Thyroid cancer ([place])   ? Thyroid disease   ? hypothyroidism  ? ? ? ?Allergies  ?Allergen Reactions  ? Erythromycin Swelling  ? ? ? ?Current Outpatient Medications  ?Medication Sig Dispense Refill  ? albuterol (VENTOLIN HFA) 108 (90 Base) MCG/ACT inhaler Inhale 2 puffs into the lungs every 6 (six) hours as needed for wheezing or shortness of breath. 18 g 2  ? hydrochlorothiazide (HYDRODIURIL) 25 MG tablet Take 1 tablet by mouth once daily 90 tablet 0  ? ibuprofen (ADVIL,MOTRIN) 800 MG tablet Take 1 tablet (800 mg total) by mouth every 8 (eight) hours. 30 tablet 1  ? levothyroxine (EUTHYROX) 200 MCG tablet Take 1 tablet (200 mcg total) by mouth daily before breakfast. 90 tablet 3  ? lisinopril (ZESTRIL) 40 MG tablet Take 1 tablet (40 mg total) by mouth daily. 90 tablet 3  ? Medium Chain Triglycerides (MCT OIL PO) Take 2,000 mg by mouth daily.    ? potassium chloride SA (KLOR-CON M) 20 MEQ tablet Take 1 tablet (20 mEq total) by mouth daily for 5 doses. 5 tablet 0  ? triamcinolone (NASACORT) 55 MCG/ACT AERO nasal  inhaler Place 2 sprays into the nose daily.    ? ?No current facility-administered medications for this visit.  ? ? ? ?Past Surgical History:  ?Procedure Laterality Date  ? ABDOMINAL HYSTERECTOMY    ? ADENOIDECTOMY    ? APPENDECTOMY    ? CHOLECYSTECTOMY    ? KIDNEY SURGERY    ? left ureter reimplantation  ? THYROID LOBECTOMY Left   ? TONSILLECTOMY    ? ? ? ?Allergies  ?Allergen Reactions  ? Erythromycin Swelling  ? ? ? ? ?Family History  ?Problem Relation Age of Onset  ? Hypertension Mother   ? Cancer Mother   ?     breast cancer  ? Hypertension Father   ? Cancer Father   ?     prostate cancer  ? Stroke Father   ? Stroke Maternal Grandmother   ? ? ? ?Social History ?Ms. Evers reports that she has quit smoking. Her smoking use included cigarettes. She has never used smokeless tobacco. ?Ms. Vore reports current alcohol use. ? ? ?Review of Systems ?CONSTITUTIONAL: No weight loss, fever, chills, weakness or fatigue.  ?HEENT: Eyes: No visual loss, blurred vision, double vision or yellow sclerae.No hearing loss, sneezing, congestion, runny nose or sore throat.  ?SKIN: No rash or itching.  ?  CARDIOVASCULAR: per hpi ?RESPIRATORY: No shortness of breath, cough or sputum.  ?GASTROINTESTINAL: No anorexia, nausea, vomiting or diarrhea. No abdominal pain or blood.  ?GENITOURINARY: No burning on urination, no polyuria ?NEUROLOGICAL: No headache, dizziness, syncope, paralysis, ataxia, numbness or tingling in the extremities. No change in bowel or bladder control.  ?MUSCULOSKELETAL: No muscle, back pain, joint pain or stiffness.  ?LYMPHATICS: No enlarged nodes. No history of splenectomy.  ?PSYCHIATRIC: No history of depression or anxiety.  ?ENDOCRINOLOGIC: No reports of sweating, cold or heat intolerance. No polyuria or polydipsia.  ?. ? ? ?Physical Examination ?Today's Vitals  ? 03/09/22 0904  ?BP: (!) 156/100  ?Pulse: 80  ?SpO2: 97%  ?Weight: 271 lb 3.2 oz (123 kg)  ?Height: '5\' 7"'$  (1.702 m)  ? ?Body mass index is 42.48  kg/m?. ? ?Gen: resting comfortably, no acute distress ?HEENT: no scleral icterus, pupils equal round and reactive, no palptable cervical adenopathy,  ?CV: RRR, no m/r/g no jvd ?Resp: Clear to auscultation bilaterally ?GI: abdomen is soft, non-tender, non-distended, normal bowel sounds, no hepatosplenomegaly ?MSK: extremities are warm, no edema.  ?Skin: warm, no rash ?Neuro:  no focal deficits ?Psych: appropriate affect ? ? ? ? ? ?Assessment and Plan  ?1.Multifocal atrial tachycardia ?- EKG looks like MAT as opposed to afib, likely triggered by low potassium from chronic HCTZ use ?- monitor at this time with normalization of potassium, if recurrent issues would start lopressor ? ?2. Hypokalemia ?- on HCTZ for HTN and and LE edema. Would change to maxide to lower risk of recurrent hypokalemia ? ?3. HTN ?- elevated today but needs refills on lisinopril. Changning HCTZ to maxide as reported above ? ? ?F/u 6 months ? ? ? ? ? ?Arnoldo Lenis, M.D. ?

## 2022-03-09 NOTE — Patient Instructions (Addendum)
Medication Instructions:  ?Your physician has recommended you make the following change in your medication:  ?Stop taking HCTZ ?Start Maxzide 37.5-'25mg'$  once a day ?Continue all other medications as prescribed ? ?Labwork: ?none ? ?Testing/Procedures: ?none ? ?Follow-Up: ?Your physician recommends that you schedule a follow-up appointment in: 6 months ? ?Any Other Special Instructions Will Be Listed Below (If Applicable). ? ?If you need a refill on your cardiac medications before your next appointment, please call your pharmacy. ? ?

## 2022-04-05 ENCOUNTER — Ambulatory Visit: Payer: No Typology Code available for payment source | Admitting: Family Medicine

## 2022-04-05 ENCOUNTER — Encounter: Payer: Self-pay | Admitting: Family Medicine

## 2022-04-05 VITALS — BP 122/76 | HR 67 | Temp 98.5°F | Ht 67.0 in | Wt 259.0 lb

## 2022-04-05 DIAGNOSIS — Z90710 Acquired absence of both cervix and uterus: Secondary | ICD-10-CM | POA: Diagnosis not present

## 2022-04-05 DIAGNOSIS — E876 Hypokalemia: Secondary | ICD-10-CM

## 2022-04-05 DIAGNOSIS — E890 Postprocedural hypothyroidism: Secondary | ICD-10-CM

## 2022-04-05 DIAGNOSIS — L989 Disorder of the skin and subcutaneous tissue, unspecified: Secondary | ICD-10-CM

## 2022-04-05 DIAGNOSIS — I10 Essential (primary) hypertension: Secondary | ICD-10-CM

## 2022-04-05 DIAGNOSIS — E782 Mixed hyperlipidemia: Secondary | ICD-10-CM

## 2022-04-05 NOTE — Progress Notes (Signed)
Subjective:  Patient ID: Margaret Schmitt, female    DOB: 1961/01/08, 61 y.o.   MRN: [id number]  Patient Care Team: Baruch Gouty, FNP as PCP - General (Family Medicine) Harl Bowie Alphonse Guild, MD as PCP - Cardiology (Cardiology)   Chief Complaint:  Medical Management of Chronic Issues   HPI: Margaret Schmitt is a 61 y.o. female presenting on 04/05/2022 for Medical Management of Chronic Issues   Pt presents today for management of chronic medical conditions. She was recently seen in the ED and by cardiology for an episode of MAT, likely due to hypokalemia. Her HCTZ was changed to [facility] and she has tolerated this well. She reports minimal to no recurrent episodes of palpitations / tachycardia. Her blood pressure has been very well controlled with current regimen. No chest pain, headaches, visual changes, leg swelling, dizziness, weakness, confusion or syncope. She is compliant with her thyroid repletion medications and denies any heat or cold intolerance, weight changes, hair or skin changes, mood swings, or fatigue. She continues with a modified keto diet and is doing well, last weight 131 kg, today's weight 117 kg. She is only taking MCT for her hyperlipidemia. She is fasting today, will recheck lipids. She has an ongoing lesion to her forehead and would like a referral to derm, she was seeing derm in [place] but has not established here. She would also like a referral to get established with GYN in the area.    Relevant past medical, surgical, family, and social history reviewed and updated as indicated.  Allergies and medications reviewed and updated. Data reviewed: Chart in Epic.   Past Medical History:  Diagnosis Date   Asthma    Eczema    Hypertension    Hypertension    Hypothyroidism    Morbid obesity ([facility])    OSA (obstructive sleep apnea)    Thyroid cancer ([place])    Thyroid disease    hypothyroidism    Past Surgical History:  Procedure Laterality Date   ABDOMINAL  HYSTERECTOMY     ADENOIDECTOMY     APPENDECTOMY     CHOLECYSTECTOMY     KIDNEY SURGERY     left ureter reimplantation   THYROID LOBECTOMY Left    TONSILLECTOMY      Social History   Socioeconomic History   Marital status: Single    Spouse name: Not on file   Number of children: Not on file   Years of education: Not on file   Highest education level: Not on file  Occupational History   Not on file  Tobacco Use   Smoking status: Former    Types: Cigarettes   Smokeless tobacco: Never  Vaping Use   Vaping Use: Never used  Substance and Sexual Activity   Alcohol use: Yes    Comment: rarely   Drug use: Not Currently   Sexual activity: Not on file  Other Topics Concern   Not on file  Social History Narrative   Not on file   Social Determinants of Health   Financial Resource Strain: Not on file  Food Insecurity: Not on file  Transportation Needs: Not on file  Physical Activity: Not on file  Stress: Not on file  Social Connections: Not on file  Intimate Partner Violence: Not on file    Outpatient Encounter Medications as of 04/05/2022  Medication Sig   albuterol (VENTOLIN HFA) 108 (90 Base) MCG/ACT inhaler Inhale 2 puffs into the lungs every 6 (six) hours as needed for  wheezing or shortness of breath.   ibuprofen (ADVIL,MOTRIN) 800 MG tablet Take 1 tablet (800 mg total) by mouth every 8 (eight) hours.   levothyroxine (EUTHYROX) 200 MCG tablet Take 1 tablet (200 mcg total) by mouth daily before breakfast.   lisinopril (ZESTRIL) 40 MG tablet Take 1 tablet (40 mg total) by mouth daily.   Medium Chain Triglycerides (MCT OIL PO) Take 2,000 mg by mouth daily.   triamcinolone (NASACORT) 55 MCG/ACT AERO nasal inhaler Place 2 sprays into the nose daily.   triamterene-hydrochlorothiazide (MAXZIDE-25) 37.5-25 MG tablet Take 1 tablet by mouth daily.   No facility-administered encounter medications on file as of 04/05/2022.    Allergies  Allergen Reactions   Erythromycin Swelling     Review of Systems  Constitutional:  Negative for activity change, appetite change, chills, diaphoresis, fatigue, fever and unexpected weight change.  Respiratory:  Negative for cough and shortness of breath.   Cardiovascular:  Negative for chest pain, palpitations and leg swelling.  Gastrointestinal:  Negative for abdominal pain.  Endocrine: Negative for cold intolerance, heat intolerance, polydipsia, polyphagia and polyuria.  Genitourinary:  Negative for decreased urine volume and difficulty urinating.  Musculoskeletal:  Negative for arthralgias and myalgias.  Skin:        Skin lesion to forehead  Neurological:  Negative for dizziness, tremors, seizures, syncope, facial asymmetry, speech difficulty, weakness, light-headedness, numbness and headaches.  Psychiatric/Behavioral:  Negative for confusion.   All other systems reviewed and are negative.      Objective:  BP 122/76   Pulse 67   Temp 98.5 F (36.9 C)   Ht '5\' 7"'  (1.702 m)   Wt 259 lb (117.5 kg)   SpO2 96%   BMI 40.57 kg/m    Wt Readings from Last 3 Encounters:  04/05/22 259 lb (117.5 kg)  03/09/22 271 lb 3.2 oz (123 kg)  02/05/22 277 lb (125.6 kg)    Physical Exam Vitals and nursing note reviewed.  Constitutional:      General: She is not in acute distress.    Appearance: Normal appearance. She is obese. She is not ill-appearing, toxic-appearing or diaphoretic.  HENT:     Head: Normocephalic and atraumatic.     Mouth/Throat:     Mouth: Mucous membranes are moist.  Eyes:     Conjunctiva/sclera: Conjunctivae normal.     Pupils: Pupils are equal, round, and reactive to light.  Cardiovascular:     Rate and Rhythm: Normal rate and regular rhythm.     Heart sounds: Normal heart sounds. No murmur heard.   No friction rub. No gallop.  Pulmonary:     Effort: Pulmonary effort is normal.     Breath sounds: Normal breath sounds.  Musculoskeletal:     Cervical back: Neck supple.     Right lower leg: No edema.      Left lower leg: No edema.  Skin:    General: Skin is warm and dry.     Capillary Refill: Capillary refill takes less than 2 seconds.     Findings: Lesion present.       Neurological:     General: No focal deficit present.     Mental Status: She is alert and oriented to person, place, and time.  Psychiatric:        Mood and Affect: Mood normal.        Behavior: Behavior normal.        Thought Content: Thought content normal.        Judgment:  Judgment normal.    Results for orders placed or performed during the hospital encounter of 14/97/02  Basic metabolic panel  Result Value Ref Range   Sodium 138 135 - 145 mmol/L   Potassium 3.3 (L) 3.5 - 5.1 mmol/L   Chloride 106 98 - 111 mmol/L   CO2 21 (L) 22 - 32 mmol/L   Glucose, Bld 126 (H) 70 - 99 mg/dL   BUN 28 (H) 6 - 20 mg/dL   Creatinine, Ser 1.01 (H) 0.44 - 1.00 mg/dL   Calcium 8.8 (L) 8.9 - 10.3 mg/dL   GFR, Estimated >60 >60 mL/min   Anion gap 11 5 - 15  CBC  Result Value Ref Range   WBC 8.8 4.0 - 10.5 K/uL   RBC 5.27 (H) 3.87 - 5.11 MIL/uL   Hemoglobin 13.7 12.0 - 15.0 g/dL   HCT 42.4 36.0 - 46.0 %   MCV 80.5 80.0 - 100.0 fL   MCH 26.0 26.0 - 34.0 pg   MCHC 32.3 30.0 - 36.0 g/dL   RDW 15.0 11.5 - 15.5 %   Platelets 211 150 - 400 K/uL   nRBC 0.0 0.0 - 0.2 %       Pertinent labs & imaging results that were available during my care of the patient were reviewed by me and considered in my medical decision making.  Assessment & Plan:  Yasha was seen today for medical management of chronic issues.  Diagnoses and all orders for this visit:  Essential hypertension BP well controlled. Changes were not made in regimen today. Goal BP is 130/80. Pt aware to report any persistent high or low readings. DASH diet and exercise encouraged. Exercise at least 150 minutes per week and increase as tolerated. Goal BMI > 25. Stress management encouraged. Avoid nicotine and tobacco product use. Avoid excessive alcohol and NSAID's. Avoid  more than 2000 mg of sodium daily. Medications as prescribed. Follow up as scheduled.  -     BMP8+EGFR -     CBC with Differential/Platelet -     Thyroid Panel With TSH -     Lipid panel  Postoperative hypothyroidism Thyroid disease has been well controlled. Labs are pending. Adjustments to regimen will be made if warranted. Make sure to take medications on an empty stomach with a full glass of water. Make sure to avoid vitamins or supplements for at least 4 hours before and 4 hours after taking medications. Repeat labs in 3 months if adjustments are made and in 6 months if stable.   -     Thyroid Panel With TSH  Hypokalemia HCTZ was changed to [facility] for potassium sparing. Will recheck BMP today.  -     BMP8+EGFR  History of hysterectomy Would like referral to GYN to get established in area.  -     Ambulatory referral to Gynecology  Lesion of face Ongoing lesion. Would like referral to dermatology to get established in area.  -     Ambulatory referral to Dermatology  Mixed hyperlipidemia Diet encouraged - increase intake of fresh fruits and vegetables, increase intake of lean proteins. Bake, broil, or grill foods. Avoid fried, greasy, and fatty foods. Avoid fast foods. Increase intake of fiber-rich whole grains. Exercise encouraged - at least 150 minutes per week and advance as tolerated. Labs pending. -     Lipid panel  Morbid obesity ([place]) Has been doing very well on Keto diet. Will check labs today. Continue with diet and exercise.  -  BMP8+EGFR -     CBC with Differential/Platelet -     Thyroid Panel With TSH -     Lipid panel     Continue all other maintenance medications.  Follow up plan: Return in about 6 months (around 10/05/2022), or if symptoms worsen or fail to improve, for chronic follow up .   Continue healthy lifestyle choices, including diet (rich in fruits, vegetables, and lean proteins, and low in salt and simple carbohydrates) and exercise (at least 30  minutes of moderate physical activity daily).  Educational handout given for AVS  The above assessment and management plan was discussed with the patient. The patient verbalized understanding of and has agreed to the management plan. Patient is aware to call the clinic if they develop any new symptoms or if symptoms persist or worsen. Patient is aware when to return to the clinic for a follow-up visit. Patient educated on when it is appropriate to go to the emergency department.   Monia Pouch, FNP-C [facility] [phone]

## 2022-04-05 NOTE — Patient Instructions (Signed)
It was a pleasure seeing you today, Margaret Schmitt.  Information regarding what we discussed is included in this packet.  Please make an appointment to see me in 6 months.    If you had labs performed today, you will be contacted with the abnormal results once they are available, usually in the next 3 business days for routine lab work.  If you had STI testing, a pap smear, or a biopsy performed, expect to be contacted in about 7-10 days. If results are normal, you will not be notified.    In a few days you may receive a survey in the mail or online from [employer] regarding your visit with [place] today. Please take a moment to fill this out. Your feedback is very important to our office. It can help [place] better understand your needs as well as improve your experience and satisfaction. Thank you for taking your time to complete it. We care about you.   Please feel free to call our office if any questions or concerns arise.  Warm Regards, Monia Pouch, FNP-C [facility] [address] [place], [place] [address] [phone]

## 2022-04-06 LAB — THYROID PANEL WITH TSH
Free Thyroxine Index: 3.3 (ref 1.2–4.9)
T3 Uptake Ratio: 30 % (ref 24–39)
T4, Total: 11 ug/dL (ref 4.5–12.0)
TSH: 0.376 u[IU]/mL — ABNORMAL LOW (ref 0.450–4.500)

## 2022-04-06 LAB — CBC WITH DIFFERENTIAL/PLATELET
Basophils Absolute: 0.1 10*3/uL (ref 0.0–0.2)
Basos: 2 %
EOS (ABSOLUTE): 0.3 10*3/uL (ref 0.0–0.4)
Eos: 4 %
Hematocrit: 40.3 % (ref 34.0–46.6)
Hemoglobin: 13.3 g/dL (ref 11.1–15.9)
Immature Grans (Abs): 0 10*3/uL (ref 0.0–0.1)
Immature Granulocytes: 0 %
Lymphocytes Absolute: 2.1 10*3/uL (ref 0.7–3.1)
Lymphs: 26 %
MCH: 26.3 pg — ABNORMAL LOW (ref 26.6–33.0)
MCHC: 33 g/dL (ref 31.5–35.7)
MCV: 80 fL (ref 79–97)
Monocytes Absolute: 0.6 10*3/uL (ref 0.1–0.9)
Monocytes: 8 %
Neutrophils Absolute: 4.8 10*3/uL (ref 1.4–7.0)
Neutrophils: 60 %
Platelets: 268 10*3/uL (ref 150–450)
RBC: 5.05 x10E6/uL (ref 3.77–5.28)
RDW: 15.7 % — ABNORMAL HIGH (ref 11.7–15.4)
WBC: 7.9 10*3/uL (ref 3.4–10.8)

## 2022-04-06 LAB — LIPID PANEL
Chol/HDL Ratio: 4.1 ratio (ref 0.0–4.4)
Cholesterol, Total: 176 mg/dL (ref 100–199)
HDL: 43 mg/dL (ref >39)
LDL Chol Calc (NIH): 113 mg/dL — ABNORMAL HIGH (ref 0–99)
Triglycerides: 107 mg/dL (ref 0–149)
VLDL Cholesterol Cal: 20 mg/dL (ref 5–40)

## 2022-04-06 LAB — BMP8+EGFR
BUN/Creatinine Ratio: 25 — ABNORMAL HIGH (ref 9–23)
BUN: 30 mg/dL — ABNORMAL HIGH (ref 6–24)
CO2: 22 mmol/L (ref 20–29)
Calcium: 9.4 mg/dL (ref 8.7–10.2)
Chloride: 102 mmol/L (ref 96–106)
Creatinine, Ser: 1.18 mg/dL — ABNORMAL HIGH (ref 0.57–1.00)
Glucose: 88 mg/dL (ref 70–99)
Potassium: 4.7 mmol/L (ref 3.5–5.2)
Sodium: 141 mmol/L (ref 134–144)
eGFR: 53 mL/min/{1.73_m2} — ABNORMAL LOW (ref >59)

## 2022-06-20 ENCOUNTER — Emergency Department (HOSPITAL_COMMUNITY): Payer: No Typology Code available for payment source

## 2022-06-20 ENCOUNTER — Encounter (HOSPITAL_COMMUNITY): Payer: Self-pay

## 2022-06-20 ENCOUNTER — Emergency Department (HOSPITAL_COMMUNITY)
Admission: EM | Admit: 2022-06-20 | Discharge: 2022-06-20 | Disposition: A | Discharge status: Home / Self Care | Payer: No Typology Code available for payment source | Attending: Emergency Medicine | Admitting: Emergency Medicine

## 2022-06-20 DIAGNOSIS — R110 Nausea: Secondary | ICD-10-CM

## 2022-06-20 DIAGNOSIS — R112 Nausea with vomiting, unspecified: Secondary | ICD-10-CM | POA: Diagnosis not present

## 2022-06-20 DIAGNOSIS — R079 Chest pain, unspecified: Secondary | ICD-10-CM

## 2022-06-20 DIAGNOSIS — R0789 Other chest pain: Secondary | ICD-10-CM | POA: Insufficient documentation

## 2022-06-20 DIAGNOSIS — I10 Essential (primary) hypertension: Secondary | ICD-10-CM | POA: Insufficient documentation

## 2022-06-20 DIAGNOSIS — Z79899 Other long term (current) drug therapy: Secondary | ICD-10-CM | POA: Diagnosis not present

## 2022-06-20 DIAGNOSIS — F17200 Nicotine dependence, unspecified, uncomplicated: Secondary | ICD-10-CM | POA: Diagnosis not present

## 2022-06-20 LAB — BASIC METABOLIC PANEL
Anion gap: 7 (ref 5–15)
BUN: 23 mg/dL — ABNORMAL HIGH (ref 6–20)
CO2: 24 mmol/L (ref 22–32)
Calcium: 9.1 mg/dL (ref 8.9–10.3)
Chloride: 108 mmol/L (ref 98–111)
Creatinine, Ser: 0.9 mg/dL (ref 0.44–1.00)
GFR, Estimated: >60 mL/min (ref >60)
Glucose, Bld: 117 mg/dL — ABNORMAL HIGH (ref 70–99)
Potassium: 3.6 mmol/L (ref 3.5–5.1)
Sodium: 139 mmol/L (ref 135–145)

## 2022-06-20 LAB — CBC
HCT: 41 % (ref 36.0–46.0)
Hemoglobin: 13.6 g/dL (ref 12.0–15.0)
MCH: 27.6 pg (ref 26.0–34.0)
MCHC: 33.2 g/dL (ref 30.0–36.0)
MCV: 83.3 fL (ref 80.0–100.0)
Platelets: 280 10*3/uL (ref 150–400)
RBC: 4.92 MIL/uL (ref 3.87–5.11)
RDW: 15.1 % (ref 11.5–15.5)
WBC: 7.5 10*3/uL (ref 4.0–10.5)
nRBC: 0 % (ref 0.0–0.2)

## 2022-06-20 LAB — TROPONIN I (HIGH SENSITIVITY)
Troponin I (High Sensitivity): 3 ng/L (ref <18)
Troponin I (High Sensitivity): 4 ng/L (ref <18)

## 2022-06-20 LAB — D-DIMER, QUANTITATIVE: D-Dimer, Quant: 0.42 ug/mL-FEU (ref 0.00–0.50)

## 2022-06-20 NOTE — ED Triage Notes (Signed)
Pt BIBA from home. EMS states that pt c/o intermittent chest pain, along with N/V.   325 ASA 4 zofran   BP's from 201/106 to 85/40

## 2022-06-20 NOTE — ED Provider Notes (Signed)
[facility] Provider Note   CSN: [id number] Arrival date & time: 06/20/22  0900     History  Chief Complaint  Patient presents with   Emesis    Margaret Schmitt is a 61 y.o. female.  61 year old female with a history of tobacco use (half pack a day since teenager), hypertension, and obesity presents emergency department with left-sided chest pain.  Patient states that this morning she woke up and had sharp stabbing left-sided chest pain approximately 9:30 AM.  Unsure if it was pleuritic and does not believe it was exertional.  No associated diaphoresis or vomiting but did have significant nausea.  Clarified with the patient the fact that she did not have vomiting because triage report did state that she did but she says it was nausea only.  Says that the chest pain is moderate in severity and sharp on the left side radiating to her left arm.  No associated shortness of breath.  No lower extremity swelling or history of DVT/PE or cancer.  Says that the pain resolved shortly prior to my evaluation.  No injuries to the area or history of MI/CAD.  No immediate family members with a history of CAD.  Was given 324 mg of aspirin by EMS on the way to the emergency department.   Emesis      Home Medications Prior to Admission medications   Medication Sig Start Date End Date Taking? Authorizing Provider  albuterol (VENTOLIN HFA) 108 (90 Base) MCG/ACT inhaler Inhale 2 puffs into the lungs every 6 (six) hours as needed for wheezing or shortness of breath. 07/16/20   Margaret Schmitt, Rayford Halsted, MD  ibuprofen (ADVIL,MOTRIN) 800 MG tablet Take 1 tablet (800 mg total) by mouth every 8 (eight) hours. 08/22/18   Margaret Dryer, PA-C  levothyroxine (EUTHYROX) 200 MCG tablet Take 1 tablet (200 mcg total) by mouth daily before breakfast. 01/04/22   Rakes, Connye Burkitt, FNP  lisinopril (ZESTRIL) 40 MG tablet Take 1 tablet (40 mg total) by mouth daily. 03/09/22   Margaret Lenis, MD  Medium  Chain Triglycerides (MCT OIL PO) Take 2,000 mg by mouth daily.    [provider]  triamcinolone (NASACORT) 55 MCG/ACT AERO nasal inhaler Place 2 sprays into the nose daily.    [provider]  triamterene-hydrochlorothiazide (MAXZIDE-25) 37.5-25 MG tablet Take 1 tablet by mouth daily. 03/09/22   Margaret Lenis, MD      Allergies    Erythromycin    Review of Systems   Review of Systems  Gastrointestinal:  Positive for vomiting.    Physical Exam Updated Vital Signs BP 129/82   Pulse 65   Temp 97.9 F (36.6 C) (Oral)   Resp 16   Ht '5\' 7"'$  (1.702 m)   Wt 113.4 kg   SpO2 90%   BMI 39.16 kg/m  Physical Exam Vitals and nursing note reviewed.  Constitutional:      General: She is not in acute distress.    Appearance: She is well-developed.  HENT:     Head: Normocephalic and atraumatic.     Right Ear: External ear normal.     Left Ear: External ear normal.     Nose: Nose normal.  Eyes:     Extraocular Movements: Extraocular movements intact.     Conjunctiva/sclera: Conjunctivae normal.     Pupils: Pupils are equal, round, and reactive to light.  Cardiovascular:     Rate and Rhythm: Normal rate and regular rhythm.  Heart sounds: No murmur heard.    Comments: No chest wall tenderness on the left.  No rashes noticed on the chest wall. Pulmonary:     Effort: Pulmonary effort is normal. No respiratory distress.     Breath sounds: Normal breath sounds.  Abdominal:     General: Abdomen is flat. There is no distension.     Palpations: Abdomen is soft. There is no mass.     Tenderness: There is no abdominal tenderness. There is no guarding.  Musculoskeletal:        General: No swelling.     Cervical back: Normal range of motion and neck supple.     Right lower leg: No edema.     Left lower leg: No edema.  Skin:    General: Skin is warm and dry.     Capillary Refill: Capillary refill takes less than 2 seconds.  Neurological:     Mental Status: She is  alert and oriented to person, place, and time. Mental status is at baseline.  Psychiatric:        Mood and Affect: Mood normal.     ED Results / Procedures / Treatments   Labs (all labs ordered are listed, but only abnormal results are displayed) Labs Reviewed  BASIC METABOLIC PANEL - Abnormal; Notable for the following components:      Result Value   Glucose, Bld 117 (*)    BUN 23 (*)    All other components within normal limits  CBC  D-DIMER, QUANTITATIVE  TROPONIN I (HIGH SENSITIVITY)  TROPONIN I (HIGH SENSITIVITY)    EKG EKG Interpretation  Date/Time:  Tuesday June 20 2022 09:14:10 EDT Ventricular Rate:  68 PR Interval:  179 QRS Duration: 105 QT Interval:  432 QTC Calculation: 460 R Axis:   78 Text Interpretation: Sinus rhythm Confirmed by Margaretmary Eddy [phone]) on 06/20/2022 9:15:35 AM  Radiology DG Chest 1 View  Result Date: 06/20/2022 CLINICAL DATA:  Chest pain. EXAM: CHEST  1 VIEW COMPARISON:  02/05/2022 FINDINGS: The cardiomediastinal silhouette is unremarkable. There is no evidence of focal airspace disease, pulmonary edema, suspicious pulmonary nodule/mass, pleural effusion, or pneumothorax. No acute bony abnormalities are identified. IMPRESSION: No active disease. Electronically Signed   By: Margaret Schmitt M.D.   On: 06/20/2022 09:45    Procedures Procedures   Medications Ordered in ED Medications - No data to display  ED Course/ Medical Decision Making/ A&P Clinical Course as of 06/20/22 2141  Tue Jun 20, 2022  1201 Heart Score of 3 (0-0-1-2-0).  [RP]    Clinical Course User Index [RP] [name], MD                           Medical Decision Making Amount and/or Complexity of Data Reviewed Labs: ordered. Radiology: ordered.   61 year old female with a history of tobacco use (half pack a day since teenager), hypertension, and obesity presents emergency department with left-sided chest pain.   Initial DDx: MI, PE, pneumothorax,  pneumonia, chest wall pain MI considered based on the patient's risk factors but her sharp pain that is not exertional is a bit atypical for ischemia.  Pulmonary embolism also on the differential given her history of tobacco use and obesity.   Plan:  Labs D-dimer Troponin EKG Chest x-ray  ED Summary:  Patient underwent the above work-up which was overall reassuring.  EKG and troponins did not suggest MI based on heart score do feel she  is appropriate for OP evaluation and continued workup.  D-dimer was WNL.  Chest x-ray without acute abnormality.  Patient's chest pain had resolved and she was instructed on taking Tylenol as well as lidocaine patches over-the-counter for pain.  We will have her follow-up with her primary doctor for further management.  Additional history obtained from significant other Records reviewed Care Everywhere/External Records  I independently visualized the following imaging with scope of interpretation limited to determining acute life threatening conditions related to emergency care: Chest x-ray, which revealed no acute abnormalities  Final Clinical Impression(s) / ED Diagnoses Final diagnoses:  Chest pain, unspecified type  Nausea without vomiting    Rx / DC Orders ED Discharge Orders     None         [name], MD 06/20/22 2142

## 2022-06-20 NOTE — Discharge Instructions (Addendum)
Today you were seen in the emergency department for your chest pain.  In the emergency department you had lab work, EKGs, and chest x-rays that were reassuring.    At home, please take Tylenol and lidocaine patches over-the-counter as needed for any pain that may have.    Follow-up with your primary doctor in 2-3 days regarding your visit.    Return immediately to the emergency department if you experience any of the following: Worsening pain, vomiting, unexplained sweating, or any other concerning symptoms.    Thank you for visiting our Emergency Department. It was a pleasure taking care of you today.

## 2022-06-28 ENCOUNTER — Ambulatory Visit: Payer: No Typology Code available for payment source | Attending: Cardiology

## 2022-06-28 ENCOUNTER — Ambulatory Visit: Payer: No Typology Code available for payment source | Attending: Cardiology | Admitting: Cardiology

## 2022-06-28 ENCOUNTER — Encounter: Payer: Self-pay | Admitting: Cardiology

## 2022-06-28 VITALS — BP 160/90 | HR 80 | Ht 67.0 in | Wt 262.0 lb

## 2022-06-28 DIAGNOSIS — R0789 Other chest pain: Secondary | ICD-10-CM

## 2022-06-28 DIAGNOSIS — I10 Essential (primary) hypertension: Secondary | ICD-10-CM

## 2022-06-28 LAB — ECHOCARDIOGRAM COMPLETE
AR max vel: 2.47 cm2
AV Area VTI: 2.5 cm2
AV Area mean vel: 2.28 cm2
AV Mean grad: 4.9 mmHg
AV Peak grad: 9.4 mmHg
Ao pk vel: 1.54 m/s
Area-P 1/2: 3.08 cm2
Calc EF: 72.8 %
Height: 67 in
MV M vel: 2.37 m/s
MV Peak grad: 22.5 mmHg
S' Lateral: 2.64 cm
Single Plane A2C EF: 69 %
Single Plane A4C EF: 74.7 %
Weight: 4192 oz

## 2022-06-28 MED ORDER — POTASSIUM CHLORIDE CRYS ER 20 MEQ PO TBCR: 20.0000 meq | EXTENDED_RELEASE_TABLET | ORAL | 1 refills | Status: DC | PRN

## 2022-06-28 MED ORDER — FUROSEMIDE 20 MG PO TABS: 20.0000 mg | ORAL_TABLET | ORAL | 1 refills | Status: DC | PRN

## 2022-06-28 MED ORDER — LISINOPRIL 20 MG PO TABS: 20.0000 mg | ORAL_TABLET | Freq: Every day | ORAL | 1 refills | Status: DC

## 2022-06-28 NOTE — Progress Notes (Signed)
Clinical Summary Ms. Osoria is a 61 y.o.female seen today for follow up of the following medical problems.     Tachycardia ER visit with tachycardia - computer interpretation of EKG was afib, on my review looks more like MAT - K was low 3.3, AKI - sudden onset of fluttering in chest. Has had before but usually short in duration. This episode was prolonged, +weakness. Mild SOB.  - 1-2 coffees per day, 1-2 sodas but has cut back, no EtOH   - reports prior workup for palpitations showed only PVCs over 10 years ago.  - occasional palpitations at times.      2. HTN - checks bp daily - bp's typically 107/70s, 110s/80s. High pressure at times, 170s.  - does not take maxide every day. On average takes about 3-4 days a week - takes lisinopril roughly every other day.    3. Chest pain - ER visit 05/2022, EKG NSR no ischemic changes. CXR no acute process - ddimer neg, trop neg x 2. EKG    - just woke up, sat on side of bed. Left side sharp pain, 6/10 in severity. Felt like heart was racing, checked her vitals SBP 170s. +nauseous. No SOB, no diaphoretic. Not positional. Called EMS. Had some palpitations.  - lasted about 1 hour.       SH: works TEFL teacher, monitors inpatient telemetry Past Medical History:  Diagnosis Date   Asthma    Eczema    Hypertension    Hypertension    Hypothyroidism    Morbid obesity ([place])    OSA (obstructive sleep apnea)    Thyroid cancer (HCC)    Thyroid disease    hypothyroidism     Allergies  Allergen Reactions   Erythromycin Swelling     Current Outpatient Medications  Medication Sig Dispense Refill   albuterol (VENTOLIN HFA) 108 (90 Base) MCG/ACT inhaler Inhale 2 puffs into the lungs every 6 (six) hours as needed for wheezing or shortness of breath. 18 g 2   ibuprofen (ADVIL,MOTRIN) 800 MG tablet Take 1 tablet (800 mg total) by mouth every 8 (eight) hours. 30 tablet 1   levothyroxine (EUTHYROX) 200 MCG tablet Take 1 tablet (200  mcg total) by mouth daily before breakfast. 90 tablet 3   lisinopril (ZESTRIL) 40 MG tablet Take 1 tablet (40 mg total) by mouth daily. 90 tablet 3   Medium Chain Triglycerides (MCT OIL PO) Take 2,000 mg by mouth daily.     triamcinolone (NASACORT) 55 MCG/ACT AERO nasal inhaler Place 2 sprays into the nose daily.     triamterene-hydrochlorothiazide (MAXZIDE-25) 37.5-25 MG tablet Take 1 tablet by mouth daily. 90 tablet 1   No current facility-administered medications for this visit.     Past Surgical History:  Procedure Laterality Date   ABDOMINAL HYSTERECTOMY     ADENOIDECTOMY     APPENDECTOMY     CHOLECYSTECTOMY     KIDNEY SURGERY     left ureter reimplantation   THYROID LOBECTOMY Left    TONSILLECTOMY       Allergies  Allergen Reactions   Erythromycin Swelling      Family History  Problem Relation Age of Onset   Hypertension Mother    Cancer Mother        breast cancer   Hypertension Father    Cancer Father        prostate cancer   Stroke Father    Stroke Maternal Grandmother      Social  History Ms. Carrero reports that she has been smoking cigarettes. She has been smoking an average of .5 packs per day. She has never used smokeless tobacco. Ms. Prunty reports current alcohol use.   Review of Systems CONSTITUTIONAL: No weight loss, fever, chills, weakness or fatigue.  HEENT: Eyes: No visual loss, blurred vision, double vision or yellow sclerae.No hearing loss, sneezing, congestion, runny nose or sore throat.  SKIN: No rash or itching.  CARDIOVASCULAR: per hpi RESPIRATORY: No shortness of breath, cough or sputum.  GASTROINTESTINAL: No anorexia, nausea, vomiting or diarrhea. No abdominal pain or blood.  GENITOURINARY: No burning on urination, no polyuria NEUROLOGICAL: No headache, dizziness, syncope, paralysis, ataxia, numbness or tingling in the extremities. No change in bowel or bladder control.  MUSCULOSKELETAL: No muscle, back pain, joint pain or stiffness.   LYMPHATICS: No enlarged nodes. No history of splenectomy.  PSYCHIATRIC: No history of depression or anxiety.  ENDOCRINOLOGIC: No reports of sweating, cold or heat intolerance. No polyuria or polydipsia.  Marland Kitchen   Physical Examination Today's Vitals   06/28/22 1304  BP: (!) 170/120  Pulse: 80  SpO2: 98%  Weight: 262 lb (118.8 kg)  Height: '5\' 7"'$  (1.702 m)   Body mass index is 41.04 kg/m.  Gen: resting comfortably, no acute distress HEENT: no scleral icterus, pupils equal round and reactive, no palptable cervical adenopathy,  CV: RRR, no m/r/g, no jvd Resp: Clear to auscultation bilaterally GI: abdomen is soft, non-tender, non-distended, normal bowel sounds, no hepatosplenomegaly MSK: extremities are warm, no edema.  Skin: warm, no rash Neuro:  no focal deficits Psych: appropriate affect   Diagnostic Studies     Assessment and Plan  1.Chest pain - unclear etiology, start workup with echo. Pending results likely GXT  2. HTN - labile bp's. Has had some significant weight loss likely affecting her numbers. Often bp's are low and will not take her bp meds, also concerns about recurrent low K so sometimes won't take her maxide - d/c maxide, change lisinopril to '20mg'$  daily. Update [place] on home bp's in 1 week.       Arnoldo Lenis, M.D.

## 2022-06-28 NOTE — Patient Instructions (Addendum)
Medication Instructions:  Your physician has recommended you make the following change in your medication:  Stop maxide Decrease lisinopril to 20 mg once a day Start lasix 20 mg as needed for swelling Start potassium 20 meg only when taking lasix  Continue all other medications as directed  Labwork: none  Testing/Procedures: Your physician has requested that you have an echocardiogram. Echocardiography is a painless test that uses sound waves to create images of your heart. It provides your doctor with information about the size and shape of your heart and how well your heart's chambers and valves are working. This procedure takes approximately one hour. There are no restrictions for this procedure.   Follow-Up:  Your physician recommends that you schedule a follow-up appointment in: keep scheduled appointment in November  Any Other Special Instructions Will Be Listed Below (If Applicable).  Call in one week with blood pressure readings  If you need a refill on your cardiac medications before your next appointment, please call your pharmacy.

## 2022-07-05 ENCOUNTER — Telehealth: Payer: Self-pay | Admitting: Cardiology

## 2022-07-05 NOTE — Telephone Encounter (Signed)
Pt c/o BP issue: STAT if pt c/o blurred vision, one-sided weakness or slurred speech  1. What are your last 5 BP readings? 164/115  2. Are you having any other symptoms (ex. Dizziness, headache, blurred vision, passed out)?   3. What is your BP issue? Patient returning call for echo results. She also states the BP medication is still not working and her BP is still high. She says today her BP was 164/115 then went up to 174/114. She says her HR has been normal around 70-80. She says her lisinopril was lowered to 20 mg, but due to her BP being high she has not lowered the dose. She says she is still taking 40 mg of the lisinopril.

## 2022-07-06 NOTE — Telephone Encounter (Signed)
Try taking the lisinorpil '20mg'$  bid as opposed to all at once. She had reported some low bp's at times in the morning, sometimes spreading out the dose can even out the bp's better  Zandra Abts MD

## 2022-07-07 ENCOUNTER — Telehealth: Payer: Self-pay

## 2022-07-07 ENCOUNTER — Telehealth: Payer: Self-pay | Admitting: Cardiology

## 2022-07-07 DIAGNOSIS — I10 Essential (primary) hypertension: Secondary | ICD-10-CM

## 2022-07-07 DIAGNOSIS — R0789 Other chest pain: Secondary | ICD-10-CM

## 2022-07-07 NOTE — Telephone Encounter (Signed)
Checking percert on the following patient for testing scheduled at [facility]     GXT  07/18/2022

## 2022-07-07 NOTE — Telephone Encounter (Signed)
Please see message from patient.  GXT order sent for scheduling. Please advise

## 2022-07-07 NOTE — Telephone Encounter (Signed)
Please schedule GXT dx: chest pain, per JB. Patient states that she is off work on Thursday, 9/14 and Tuesday, 9/19

## 2022-07-11 ENCOUNTER — Telehealth: Payer: Self-pay

## 2022-07-11 NOTE — Telephone Encounter (Signed)
Patient made aware via MyChart. ?

## 2022-07-11 NOTE — Telephone Encounter (Signed)
Mychart message sent from patient on 9/8 Please advise.  Hello, I sent a message about my blood pressures still high all the time with no more low ones. Systolic 458'K to 998'P, diastolic 382'N to nearly 053'Z. I have stayed on the Lisinopril '40mg'$  QD instead of the '20mg'$ s. I am currently using the potassium with Lasix PRN as prescribed.  Regarding the stress test, I have this coming Thursday off, or the following week Tuesday off. Any time of day is fine. Please advise on the BP med. May need to switch to a different med? I have a couple questions about my ECHO but will wait until after the stress test to discuss with Dr. Harl Bowie. Thank you and have a good weekend. Margaret Schmitt [birth year]

## 2022-07-11 NOTE — Progress Notes (Signed)
Mychart message sent making patient aware of appointment date and time as well as the instructions for testing.

## 2022-07-12 NOTE — Telephone Encounter (Signed)
If bp's remaining high and no longer low then please add norvasc '5mg'$  daily to her current regimen   Zandra Abts MD

## 2022-07-13 NOTE — Telephone Encounter (Signed)
Patient made aware via MyChart message.

## 2022-07-18 ENCOUNTER — Ambulatory Visit (HOSPITAL_COMMUNITY)
Admission: RE | Admit: 2022-07-18 | Discharge: 2022-07-18 | Disposition: A | Discharge status: Home / Self Care | Payer: No Typology Code available for payment source | Source: Ambulatory Visit | Attending: Cardiology | Admitting: Cardiology

## 2022-07-18 DIAGNOSIS — R0789 Other chest pain: Secondary | ICD-10-CM

## 2022-07-18 LAB — EXERCISE TOLERANCE TEST
Angina Index: 0
Duke Treadmill Score: 6
Estimated workload: 7
Exercise duration (min): 6 min
Exercise duration (sec): 0 s
MPHR: 160 {beats}/min
Peak HR: 139 {beats}/min
Percent HR: 86 %
RPE: 15
Rest HR: 79 {beats}/min
ST Depression (mm): 0 mm

## 2022-07-27 ENCOUNTER — Encounter: Payer: Self-pay | Admitting: Family Medicine

## 2022-07-28 ENCOUNTER — Ambulatory Visit: Payer: No Typology Code available for payment source | Admitting: Family Medicine

## 2022-07-31 ENCOUNTER — Encounter: Payer: Self-pay | Admitting: *Deleted

## 2022-08-04 ENCOUNTER — Encounter: Payer: Self-pay | Admitting: Family Medicine

## 2022-08-04 ENCOUNTER — Ambulatory Visit (INDEPENDENT_AMBULATORY_CARE_PROVIDER_SITE_OTHER): Payer: No Typology Code available for payment source | Admitting: Family Medicine

## 2022-08-04 VITALS — BP 134/87 | HR 82 | Temp 98.8°F | Ht 67.0 in | Wt 253.0 lb

## 2022-08-04 DIAGNOSIS — F411 Generalized anxiety disorder: Secondary | ICD-10-CM | POA: Diagnosis not present

## 2022-08-04 DIAGNOSIS — N8110 Cystocele, unspecified: Secondary | ICD-10-CM | POA: Diagnosis not present

## 2022-08-04 DIAGNOSIS — F339 Major depressive disorder, recurrent, unspecified: Secondary | ICD-10-CM

## 2022-08-04 MED ORDER — PAROXETINE HCL 40 MG PO TABS: 40.0000 mg | ORAL_TABLET | ORAL | 1 refills | Status: DC

## 2022-08-04 NOTE — Progress Notes (Signed)
Subjective:  Patient ID: Margaret Schmitt, female    DOB: [birth year], 61 y.o.   MRN: [id number]  Patient Care Team: Baruch Gouty, FNP as PCP - General (Family Medicine) Harl Bowie Alphonse Guild, MD as PCP - Cardiology (Cardiology)   Chief Complaint:  Depression (anxiety)   HPI: Margaret Schmitt is a 61 y.o. female presenting on 08/04/2022 for Depression (anxiety)   Pt presents today for worsening anxiety and depressive symptoms. She has been on Paxil in the past and did well. States over the last 2 months symptoms have progressed to where she feels she needs to restart therapy. She denies SI and HI.       08/04/2022    2:52 PM 04/05/2022   12:21 PM 01/03/2022    9:07 AM 11/17/2021    2:59 PM 10/04/2021    8:07 AM  Depression screen PHQ 2/9  Decreased Interest '2 1 1 1 1  '$ Down, Depressed, Hopeless '3 2 1 1 1  '$ PHQ - 2 Score '5 3 2 2 2  '$ Altered sleeping '2 2 1 1 1  '$ Tired, decreased energy '3 2 2 1 1  '$ Change in appetite 1 0 '1 1 2  '$ Feeling bad or failure about yourself  2 2 0 0 0  Trouble concentrating 1 0 0 0 0  Moving slowly or fidgety/restless 1 0 0 0 0  Suicidal thoughts 0 0 0 0 0  PHQ-9 Score '15 9 6 5 6  '$ Difficult doing work/chores  Not difficult at all Not difficult at all Not difficult at all Not difficult at all      08/04/2022    2:52 PM 04/05/2022   12:21 PM 01/03/2022    9:08 AM 11/17/2021    2:59 PM  GAD 7 : Generalized Anxiety Score  Nervous, Anxious, on Edge '3 2 1 2  '$ Control/stop worrying '3 2 2 2  '$ Worry too much - different things '3 2 2 2  '$ Trouble relaxing 1 0 0 1  Restless 0 0 0 0  Easily annoyed or irritable '2 2 3 3  '$ Afraid - awful might happen 2 1 0 1  Total GAD 7 Score '14 9 8 11  '$ Anxiety Difficulty Somewhat difficult Not difficult at all Not difficult at all Not difficult at all      Relevant past medical, surgical, family, and social history reviewed and updated as indicated.  Allergies and medications reviewed and updated. Data reviewed: Chart in  E[facility].   Past Medical History:  Diagnosis Date   Asthma    Eczema    Hypertension    Hypertension    Hypothyroidism    Morbid obesity (HCC)    OSA (obstructive sleep apnea)    Thyroid cancer ([place])    Thyroid disease    hypothyroidism    Past Surgical History:  Procedure Laterality Date   ABDOMINAL HYSTERECTOMY     ADENOIDECTOMY     APPENDECTOMY     CHOLECYSTECTOMY     KIDNEY SURGERY     left ureter reimplantation   THYROID LOBECTOMY Left    TONSILLECTOMY      Social History   Socioeconomic History   Marital status: Single    Spouse name: Not on file   Number of children: Not on file   Years of education: Not on file   Highest education level: Not on file  Occupational History   Not on file  Tobacco Use   Smoking status: Every Day  Packs/day: 0.50    Types: Cigarettes   Smokeless tobacco: Never  Vaping Use   Vaping Use: Never used  Substance and Sexual Activity   Alcohol use: Yes    Comment: rarely   Drug use: Not Currently   Sexual activity: Not on file  Other Topics Concern   Not on file  Social History Narrative   Not on file   Social Determinants of Health   Financial Resource Strain: Not on file  Food Insecurity: Not on file  Transportation Needs: Not on file  Physical Activity: Not on file  Stress: Not on file  Social Connections: Not on file  Intimate Partner Violence: Not on file    Outpatient Encounter Medications as of 08/04/2022  Medication Sig   albuterol (VENTOLIN HFA) 108 (90 Base) MCG/ACT inhaler Inhale 2 puffs into the lungs every 6 (six) hours as needed for wheezing or shortness of breath.   furosemide (LASIX) 20 MG tablet Take 1 tablet (20 mg total) by mouth as needed (for swelling).   ibuprofen (ADVIL,MOTRIN) 800 MG tablet Take 1 tablet (800 mg total) by mouth every 8 (eight) hours.   levothyroxine (EUTHYROX) 200 MCG tablet Take 1 tablet (200 mcg total) by mouth daily before breakfast.   lisinopril (ZESTRIL) 20 MG tablet Take  1 tablet (20 mg total) by mouth daily.   Medium Chain Triglycerides (MCT OIL PO) Take 2,000 mg by mouth daily.   PARoxetine (PAXIL) 40 MG tablet Take 1 tablet (40 mg total) by mouth every morning.   potassium chloride SA (KLOR-CON M20) 20 MEQ tablet Take 1 tablet (20 mEq total) by mouth as needed. Only when taking lasix 20 mg.   triamcinolone (NASACORT) 55 MCG/ACT AERO nasal inhaler Place 2 sprays into the nose daily.   No facility-administered encounter medications on file as of 08/04/2022.    Allergies  Allergen Reactions   Erythromycin Swelling    Review of Systems  Constitutional:  Positive for activity change, appetite change and fatigue. Negative for chills, diaphoresis, fever and unexpected weight change.  Respiratory:  Negative for cough and shortness of breath.   Cardiovascular:  Negative for chest pain, palpitations and leg swelling.  Gastrointestinal:  Negative for abdominal pain.  Genitourinary:  Negative for decreased urine volume and difficulty urinating.  Musculoskeletal:  Negative for arthralgias and myalgias.  Neurological:  Negative for dizziness, weakness and headaches.  Psychiatric/Behavioral:  Positive for agitation, decreased concentration, dysphoric mood and sleep disturbance. Negative for behavioral problems, confusion, hallucinations, self-injury and suicidal ideas. The patient is nervous/anxious. The patient is not hyperactive.   All other systems reviewed and are negative.       Objective:  BP 134/87   Pulse 82   Temp 98.8 F (37.1 C)   Ht '5\' 7"'$  (1.702 m)   Wt 253 lb (114.8 kg)   SpO2 96%   BMI 39.63 kg/m    Wt Readings from Last 3 Encounters:  08/04/22 253 lb (114.8 kg)  06/28/22 262 lb (118.8 kg)  06/20/22 250 lb (113.4 kg)    Physical Exam Vitals and nursing note reviewed.  Constitutional:      General: She is not in acute distress.    Appearance: Normal appearance. She is obese. She is not ill-appearing, toxic-appearing or diaphoretic.   HENT:     Head: Normocephalic and atraumatic.     Nose: Nose normal.     Mouth/Throat:     Mouth: Mucous membranes are moist.  Eyes:     Pupils: Pupils  are equal, round, and reactive to light.  Cardiovascular:     Rate and Rhythm: Normal rate.  Pulmonary:     Effort: Pulmonary effort is normal. No respiratory distress.  Skin:    General: Skin is warm and dry.     Capillary Refill: Capillary refill takes less than 2 seconds.  Neurological:     General: No focal deficit present.     Mental Status: She is alert and oriented to person, place, and time.  Psychiatric:        Attention and Perception: Attention and perception normal.        Mood and Affect: Mood is anxious and depressed. Affect is tearful.        Speech: Speech normal.        Behavior: Behavior is cooperative.        Thought Content: Thought content normal.        Cognition and Memory: Cognition and memory normal.     Results for orders placed or performed during the hospital encounter of 07/18/22  EXERCISE TOLERANCE TEST (ETT)  Result Value Ref Range   Angina Index 0    Rest HR 79.0 bpm   Rest BP 149/103 mmHg   Exercise duration (min) 6 min   Exercise duration (sec) 0 sec   Estimated workload 7.0    Peak HR 139 bpm   Peak BP 191/99 mmHg   MPHR 160 bpm   Percent HR 86.0 %   RPE 15.0    [facility] Treadmill Score 6    ST Depression (mm) 0 mm       Pertinent labs & imaging results that were available during my care of the patient were reviewed by me and considered in my medical decision making.  Assessment & Plan:  Margaret Schmitt was seen today for depression.  Diagnoses and all orders for this visit:  Depression, recurrent ([place]) GAD (generalized anxiety disorder) Worsening symptoms. Would like to start medications. Has done well on Paxil in the past and would like to trail this again. She will take 20 mg for the first 2 weeks and then increase to 40 mg if tolerating. No SI or HI. Follow up in 6-8 weeks for  reevaluation, sooner if warranted.  -     PARoxetine (PAXIL) 40 MG tablet; Take 1 tablet (40 mg total) by mouth every morning.  Female bladder prolapse GYN was supposed to place referral, has not received appointment, new referral placed today  -     Ambulatory referral to Urogynecology     Continue all other maintenance medications.  Follow up plan: Return in about 6 weeks (around 09/15/2022).   Continue healthy lifestyle choices, including diet (rich in fruits, vegetables, and lean proteins, and low in salt and simple carbohydrates) and exercise (at least 30 minutes of moderate physical activity daily).  Educational handout given for [facility] resources   The above assessment and management plan was discussed with the patient. The patient verbalized understanding of and has agreed to the management plan. Patient is aware to call the clinic if they develop any new symptoms or if symptoms persist or worsen. Patient is aware when to return to the clinic for a follow-up visit. Patient educated on when it is appropriate to go to the emergency department.   Monia Pouch, FNP-C [facility] [phone]

## 2022-08-04 NOTE — Patient Instructions (Signed)
Your provider wants you to schedule an appointment with a Psychologist/Psychiatrist. The following list of offices requires the patient to call and make their own appointment, as there is information they need that only you can provide. Please feel free to choose form the following providers:  [facility]   [phone] Crisis Recovery in [place] [phone]  [facility]  [phone]   [address] [place], [place]  (Scheduled through [facility]) Must call and do an interview for appointment. Sees Children / Accepts Medicaid  [facility]    [phone]  [address], [address]    [place], [place]       [facility]  [phone] [address] [place], [place]  Evaluates for Autism but does not treat it Sees Children / Accepts Medicaid  [facility]    [phone] [address], [address]   [place], [place] Medication management, substance abuse, bipolar, grief, family, marriage, OCD, anxiety, PTSD Sees children / Accepts Medicaid  [facility]    [phone] [address] [place], [place] Sees children / Accepts Medicaid  [facility]  [phone] [address] [place], [place]   Dr Akinlayo     [phone] [address] [place], [place]  Sees ADD & ADHD for treatment Accepts Medicaid  [facility]  [phone] [address] [place], [place] Evaluates for Autism Accepts Medicaid  [facility]  [phone] [address] [place], [place]  Does Adult ADD evaluations Does not accept Medicaid  [facility]   [phone] [address]   [place],  Uses animal therapy  Sees children as young as 3 years old Accepts Medicaid  [facility]     [phone]    [address]  [place],  [address] Sees children Accepts Medicaid  

## 2022-09-17 IMAGING — DX DG CHEST 1V PORT
1 series · 1 of 1 positions shown · non-contrast
Comparison: None.

CLINICAL DATA: Shortness of breath.

EXAM:
PORTABLE CHEST 1 VIEW

[chest ap]
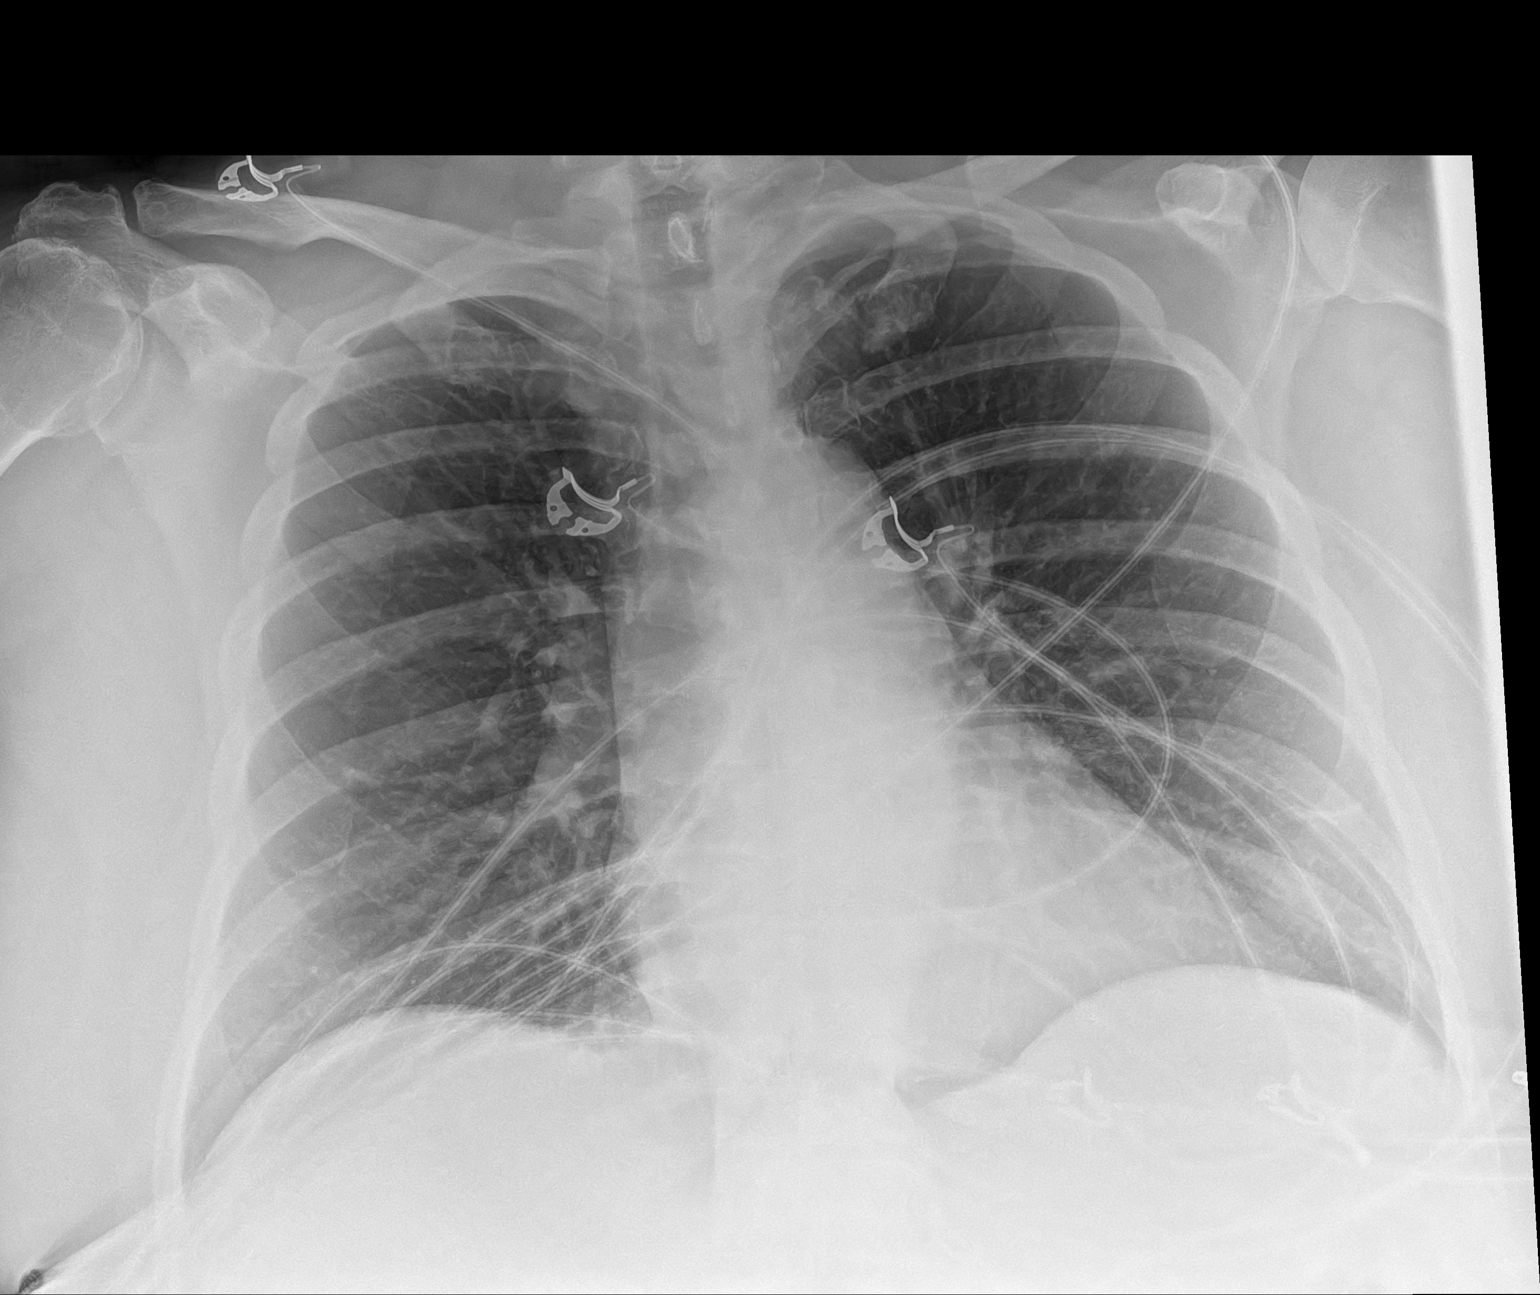

[1 of 1 positions shown; findings below may reference images not displayed]

FINDINGS: 3209 hours. The lungs are clear without focal pneumonia, edema,
pneumothorax or pleural effusion. Trace atelectasis or linear
scarring at the left base. The cardiopericardial silhouette is
within normal limits for size. The visualized bony structures of the
thorax are unremarkable. Telemetry leads overlie the chest.
IMPRESSION: No active disease.

## 2022-09-26 ENCOUNTER — Encounter: Payer: Self-pay | Admitting: Cardiology

## 2022-09-26 ENCOUNTER — Ambulatory Visit: Payer: No Typology Code available for payment source | Attending: Cardiology | Admitting: Cardiology

## 2022-09-26 VITALS — BP 160/90 | HR 101 | Ht 67.0 in | Wt 261.0 lb

## 2022-09-26 DIAGNOSIS — R0789 Other chest pain: Secondary | ICD-10-CM | POA: Diagnosis not present

## 2022-09-26 DIAGNOSIS — R002 Palpitations: Secondary | ICD-10-CM | POA: Diagnosis not present

## 2022-09-26 DIAGNOSIS — I10 Essential (primary) hypertension: Secondary | ICD-10-CM | POA: Diagnosis not present

## 2022-09-26 NOTE — Patient Instructions (Addendum)
Medication Instructions:  Continue all current medications.  Labwork: none  Testing/Procedures: none  Follow-Up: 6 months   Any Other Special Instructions Will Be Listed Below (If Applicable). Please send mychart message or call the office on Friday with update on your blood pressure readings.   If you need a refill on your cardiac medications before your next appointment, please call your pharmacy.

## 2022-09-26 NOTE — Progress Notes (Signed)
Clinical Summary Margaret Schmitt is a 61 y.o.female seen today for follow up of the following medical problems.      Tachycardia ER visit with tachycardia - computer interpretation of EKG was afib, on my review looks more like MAT - K was low 3.3, AKI - sudden onset of fluttering in chest. Has had before but usually short in duration. This episode was prolonged, +weakness. Mild SOB.  - 1-2 coffees per day, 1-2 sodas but has cut back, no EtOH   - reports prior workup for palpitations showed only PVCs over 10 years ago.  - occasional palpitations at times.   - 1-2 times per week, just a few seconds.        2. HTN - checks bp daily - bp's typically 107/70s, 110s/80s. High pressure at times, 170s.  - does not take maxide every day. On average takes about 3-4 days a week - takes lisinopril roughly every other day.   - received message about high bp's, was on lisinopril '40mg'$ . Added norvasc '5mg'$  daily.  - taking lisinopril '20mg'$  bid. Did not start norvasc from phone note.      3. Chest pain - ER visit 05/2022, EKG NSR no ischemic changes. CXR no acute process - ddimer neg, trop neg x 2. EKG      - just woke up, sat on side of bed. Left side sharp pain, 6/10 in severity. Felt like heart was racing, checked her vitals SBP 170s. +nauseous. No SOB, no diaphoretic. Not positional. Called EMS. Had some palpitations.  - lasted about 1 hour.     05/2022 echo LVEF 70-75%, no WMAs 06/2022 GXT: no ischemic changes, exercised 6 minutes  - no recurrent pains  4. Hypothyroidism - followed by pcp - had stopped thyroid medication for 1 month, going to get refilled.    SH: works TEFL teacher, monitors inpatient telemetry Past Medical History:  Diagnosis Date   Asthma    Eczema    Hypertension    Hypertension    Hypothyroidism    Morbid obesity ([place])    OSA (obstructive sleep apnea)    Thyroid cancer ([facility])    Thyroid disease    hypothyroidism     Allergies  Allergen Reactions    Erythromycin Swelling     Current Outpatient Medications  Medication Sig Dispense Refill   albuterol (VENTOLIN HFA) 108 (90 Base) MCG/ACT inhaler Inhale 2 puffs into the lungs every 6 (six) hours as needed for wheezing or shortness of breath. 18 g 2   furosemide (LASIX) 20 MG tablet Take 1 tablet (20 mg total) by mouth as needed (for swelling). 90 tablet 1   ibuprofen (ADVIL,MOTRIN) 800 MG tablet Take 1 tablet (800 mg total) by mouth every 8 (eight) hours. 30 tablet 1   levothyroxine (EUTHYROX) 200 MCG tablet Take 1 tablet (200 mcg total) by mouth daily before breakfast. 90 tablet 3   lisinopril (ZESTRIL) 20 MG tablet Take 1 tablet (20 mg total) by mouth daily. 90 tablet 1   Medium Chain Triglycerides (MCT OIL PO) Take 2,000 mg by mouth daily.     PARoxetine (PAXIL) 40 MG tablet Take 1 tablet (40 mg total) by mouth every morning. 90 tablet 1   potassium chloride SA (KLOR-CON M20) 20 MEQ tablet Take 1 tablet (20 mEq total) by mouth as needed. Only when taking lasix 20 mg. 90 tablet 1   triamcinolone (NASACORT) 55 MCG/ACT AERO nasal inhaler Place 2 sprays into the nose daily.  No current facility-administered medications for this visit.     Past Surgical History:  Procedure Laterality Date   ABDOMINAL HYSTERECTOMY     ADENOIDECTOMY     APPENDECTOMY     CHOLECYSTECTOMY     KIDNEY SURGERY     left ureter reimplantation   THYROID LOBECTOMY Left    TONSILLECTOMY       Allergies  Allergen Reactions   Erythromycin Swelling      Family History  Problem Relation Age of Onset   Hypertension Mother    Cancer Mother        breast cancer   Hypertension Father    Cancer Father        prostate cancer   Stroke Father    Stroke Maternal Grandmother      Social History Margaret Schmitt reports that she has been smoking cigarettes. She has been smoking an average of .5 packs per day. She has never used smokeless tobacco. Margaret Schmitt reports current alcohol use.   Review of  Systems CONSTITUTIONAL: No weight loss, fever, chills, weakness or fatigue.  HEENT: Eyes: No visual loss, blurred vision, double vision or yellow sclerae.No hearing loss, sneezing, congestion, runny nose or sore throat.  SKIN: No rash or itching.  CARDIOVASCULAR: per hpi RESPIRATORY: No shortness of breath, cough or sputum.  GASTROINTESTINAL: No anorexia, nausea, vomiting or diarrhea. No abdominal pain or blood.  GENITOURINARY: No burning on urination, no polyuria NEUROLOGICAL: No headache, dizziness, syncope, paralysis, ataxia, numbness or tingling in the extremities. No change in bowel or bladder control.  MUSCULOSKELETAL: No muscle, back pain, joint pain or stiffness.  LYMPHATICS: No enlarged nodes. No history of splenectomy.  PSYCHIATRIC: No history of depression or anxiety.  ENDOCRINOLOGIC: No reports of sweating, cold or heat intolerance. No polyuria or polydipsia.  Marland Kitchen   Physical Examination Today's Vitals   09/26/22 0910 09/26/22 0916  BP: (!) 156/120 (!) 164/110  Pulse: (!) 102 (!) 101  SpO2: 96% 96%  Weight: 261 lb (118.4 kg)   Height: '5\' 7"'$  (1.702 m)    Body mass index is 40.88 kg/m.  Gen: resting comfortably, no acute distress HEENT: no scleral icterus, pupils equal round and reactive, no palptable cervical adenopathy,  CV: RRR, no m/r/g, no jvd Resp: Clear to auscultation bilaterally GI: abdomen is soft, non-tender, non-distended, normal bowel sounds, no hepatosplenomegaly MSK: extremities are warm, no edema.  Skin: warm, no rash Neuro:  no focal deficits Psych: appropriate affect   Diagnostic Studies  05/2022 echo IMPRESSIONS     1. Left ventricular ejection fraction, by estimation, is 70 to 75%. The  left ventricle has hyperdynamic function. The left ventricle has no  regional wall motion abnormalities. Left ventricular diastolic parameters  were normal. The average left  ventricular global longitudinal strain is -25.5 %. The global longitudinal  strain  is normal.   2. Right ventricular systolic function is normal. The right ventricular  size is normal. Tricuspid regurgitation signal is inadequate for assessing  PA pressure.   3. The mitral valve is normal in structure. Trivial mitral valve  regurgitation. No evidence of mitral stenosis.   4. The aortic valve is tricuspid. Aortic valve regurgitation is not  visualized. No aortic stenosis is present.   5. The inferior vena cava is normal in size with greater than 50%  respiratory variability, suggesting right atrial pressure of 3 mmHg.       Assessment and Plan  1.Chest pain - resolved, echo and GXT were benign - no  further workup   2. HTN - labile bp's, we spread her lisinopril dose to '20mg'$  bid to see if smooths numbers our during the day.  - avoiding diuretic due to chronic issues with low K - high bp today perhaps component of white coat HTN, she will update [place] on home bp's later this week. Would start norvasc '5mg'$  daily if remaisn elevated  3. Palpitations - prior ER visit with palpitations, found to be in MAT in setting of hypokalemia - mild infrequent symptoms, continue to monitor.    F/u 6 months. Call us at end of week to update [place] on bp's.    Arnoldo Lenis, M.D.

## 2022-10-05 ENCOUNTER — Ambulatory Visit: Payer: No Typology Code available for payment source | Admitting: Family Medicine

## 2022-11-21 NOTE — Progress Notes (Signed)
This encounter was created in error - please disregard.

## 2022-12-07 ENCOUNTER — Ambulatory Visit: Payer: No Typology Code available for payment source | Admitting: Family Medicine

## 2022-12-07 ENCOUNTER — Encounter: Payer: Self-pay | Admitting: Family Medicine

## 2022-12-18 ENCOUNTER — Encounter: Payer: Self-pay | Admitting: *Deleted

## 2022-12-19 ENCOUNTER — Encounter: Payer: Self-pay | Admitting: Family Medicine

## 2022-12-19 ENCOUNTER — Other Ambulatory Visit: Payer: Self-pay | Admitting: Family Medicine

## 2022-12-19 DIAGNOSIS — E890 Postprocedural hypothyroidism: Secondary | ICD-10-CM

## 2022-12-19 DIAGNOSIS — Z85850 Personal history of malignant neoplasm of thyroid: Secondary | ICD-10-CM

## 2022-12-19 MED ORDER — LEVOTHYROXINE SODIUM 200 MCG PO TABS: 200.0000 ug | ORAL_TABLET | Freq: Every day | ORAL | 3 refills | Status: DC

## 2022-12-27 ENCOUNTER — Other Ambulatory Visit: Payer: Self-pay | Admitting: Cardiology

## 2022-12-27 ENCOUNTER — Encounter: Payer: Self-pay | Admitting: Family Medicine

## 2022-12-27 ENCOUNTER — Ambulatory Visit: Payer: No Typology Code available for payment source | Admitting: Family Medicine

## 2022-12-27 VITALS — BP 155/105 | HR 87 | Temp 97.1°F | Ht 67.0 in | Wt 286.6 lb

## 2022-12-27 DIAGNOSIS — F411 Generalized anxiety disorder: Secondary | ICD-10-CM

## 2022-12-27 DIAGNOSIS — I10 Essential (primary) hypertension: Secondary | ICD-10-CM

## 2022-12-27 DIAGNOSIS — R590 Localized enlarged lymph nodes: Secondary | ICD-10-CM | POA: Diagnosis not present

## 2022-12-27 DIAGNOSIS — F339 Major depressive disorder, recurrent, unspecified: Secondary | ICD-10-CM

## 2022-12-27 NOTE — Progress Notes (Signed)
Subjective:  Patient ID: Margaret Schmitt, female    DOB: [birth year], 62 y.o.   MRN: [id number]  Patient Care Team: Baruch Gouty, FNP as PCP - General (Family Medicine) Arnoldo Lenis, MD as PCP - Cardiology (Cardiology)   Chief Complaint:  Depression (6 week follow up ) and Edema (Swollen neck gland x 1 month- right side of neck )   HPI: Margaret Schmitt is a 62 y.o. female presenting on 12/27/2022 for Depression (6 week follow up ) and Edema (Swollen neck gland x 1 month- right side of neck )   1. Depression, recurrent ([place]) 2. GAD (generalized anxiety disorder) Pt was started on Paxil for her anxiety and depression, states she took this for a while but felt it made her symptoms worse so she weaned herself off of the medications. States she does feel better since stopping the medications. She denies SI or HI.     12/27/2022    8:11 AM 08/04/2022    2:52 PM 04/05/2022   12:21 PM 01/03/2022    9:07 AM 11/17/2021    2:59 PM  Depression screen PHQ 2/9  Decreased Interest '1 2 1 1 1  '$ Down, Depressed, Hopeless '1 3 2 1 1  '$ PHQ - 2 Score '2 5 3 2 2  '$ Altered sleeping '1 2 2 1 1  '$ Tired, decreased energy '1 3 2 2 1  '$ Change in appetite 3 1 0 1 1  Feeling bad or failure about yourself  0 2 2 0 0  Trouble concentrating 1 1 0 0 0  Moving slowly or fidgety/restless 0 1 0 0 0  Suicidal thoughts 0 0 0 0 0  PHQ-9 Score '8 15 9 6 5  '$ Difficult doing work/chores Not difficult at all  Not difficult at all Not difficult at all Not difficult at all      12/27/2022    8:11 AM 08/04/2022    2:52 PM 04/05/2022   12:21 PM 01/03/2022    9:08 AM  GAD 7 : Generalized Anxiety Score  Nervous, Anxious, on Edge '1 3 2 1  '$ Control/stop worrying '1 3 2 2  '$ Worry too much - different things '2 3 2 2  '$ Trouble relaxing 0 1 0 0  Restless 0 0 0 0  Easily annoyed or irritable '1 2 2 3  '$ Afraid - awful might happen '1 2 1 '$ 0  Total GAD 7 Score '6 14 9 8  '$ Anxiety Difficulty Not difficult at all Somewhat difficult Not  difficult at all Not difficult at all     She also reports a swollen lymph node to the right side of her neck. Has been there for several weeks. She did have an URI that has resolved. No other associated symptoms. She states she has not been able to manage her weight. No specific diet or exercise routine.   She is noted to be hypertensive today, states she just took her medications.      Relevant past medical, surgical, family, and social history reviewed and updated as indicated.  Allergies and medications reviewed and updated. Data reviewed: Chart in Epic.   Past Medical History:  Diagnosis Date   Asthma    Eczema    Hypertension    Hypertension    Hypothyroidism    Morbid obesity ([place])    OSA (obstructive sleep apnea)    Thyroid cancer ([place])    Thyroid disease    hypothyroidism    Past Surgical History:  Procedure Laterality Date   ABDOMINAL HYSTERECTOMY     ADENOIDECTOMY     APPENDECTOMY     CHOLECYSTECTOMY     KIDNEY SURGERY     left ureter reimplantation   THYROID LOBECTOMY Left    TONSILLECTOMY      Social History   Socioeconomic History   Marital status: Single    Spouse name: Not on file   Number of children: Not on file   Years of education: Not on file   Highest education level: Not on file  Occupational History   Not on file  Tobacco Use   Smoking status: Every Day    Packs/day: 0.50    Types: Cigarettes    Start date: 08/26/2022   Smokeless tobacco: Never  Vaping Use   Vaping Use: Never used  Substance and Sexual Activity   Alcohol use: Yes    Comment: rarely   Drug use: Not Currently   Sexual activity: Not on file  Other Topics Concern   Not on file  Social History Narrative   Not on file   Social Determinants of Health   Financial Resource Strain: Not on file  Food Insecurity: Not on file  Transportation Needs: Not on file  Physical Activity: Not on file  Stress: Not on file  Social Connections: Not on file  Intimate Partner  Violence: Not on file    Outpatient Encounter Medications as of 12/27/2022  Medication Sig   albuterol (VENTOLIN HFA) 108 (90 Base) MCG/ACT inhaler Inhale 2 puffs into the lungs every 6 (six) hours as needed for wheezing or shortness of breath.   levothyroxine (EUTHYROX) 200 MCG tablet Take 1 tablet (200 mcg total) by mouth daily before breakfast.   Medium Chain Triglycerides (MCT OIL PO) Take 2,000 mg by mouth daily.   triamcinolone (NASACORT) 55 MCG/ACT AERO nasal inhaler Place 2 sprays into the nose daily.   [DISCONTINUED] lisinopril (ZESTRIL) 20 MG tablet Take 1 tablet (20 mg total) by mouth daily. (Patient taking differently: Take 20 mg by mouth 2 (two) times daily.)   [DISCONTINUED] furosemide (LASIX) 20 MG tablet Take 1 tablet (20 mg total) by mouth as needed (for swelling).   [DISCONTINUED] ibuprofen (ADVIL,MOTRIN) 800 MG tablet Take 1 tablet (800 mg total) by mouth every 8 (eight) hours.   [DISCONTINUED] PARoxetine (PAXIL) 40 MG tablet Take 1 tablet (40 mg total) by mouth every morning.   [DISCONTINUED] potassium chloride SA (KLOR-CON M20) 20 MEQ tablet Take 1 tablet (20 mEq total) by mouth as needed. Only when taking lasix 20 mg.   No facility-administered encounter medications on file as of 12/27/2022.    Allergies  Allergen Reactions   Erythromycin Swelling    Review of Systems  Constitutional:  Positive for activity change and appetite change. Negative for chills, diaphoresis, fatigue, fever and unexpected weight change.  HENT: Negative.    Eyes: Negative.  Negative for photophobia and visual disturbance.  Respiratory:  Negative for cough, chest tightness and shortness of breath.   Cardiovascular:  Negative for chest pain, palpitations and leg swelling.  Gastrointestinal:  Negative for abdominal pain, blood in stool, constipation, diarrhea, nausea and vomiting.  Endocrine: Negative.  Negative for polydipsia, polyphagia and polyuria.  Genitourinary:  Negative for decreased  urine volume, difficulty urinating, dysuria, frequency and urgency.  Musculoskeletal:  Negative for arthralgias and myalgias.  Skin: Negative.   Allergic/Immunologic: Negative.   Neurological:  Negative for dizziness and headaches.  Hematological:  Positive for adenopathy.  Psychiatric/Behavioral:  Positive for  agitation and sleep disturbance. Negative for behavioral problems, confusion, decreased concentration, dysphoric mood, hallucinations, self-injury and suicidal ideas. The patient is nervous/anxious. The patient is not hyperactive.   All other systems reviewed and are negative.       Objective:  BP (!) 155/105   Pulse 87   Temp (!) 97.1 F (36.2 C) (Temporal)   Ht '5\' 7"'$  (1.702 m)   Wt 286 lb 9.6 oz (130 kg)   SpO2 99%   BMI 44.89 kg/m    Wt Readings from Last 3 Encounters:  12/27/22 286 lb 9.6 oz (130 kg)  09/26/22 261 lb (118.4 kg)  08/04/22 253 lb (114.8 kg)    Physical Exam Vitals and nursing note reviewed.  Constitutional:      General: She is not in acute distress.    Appearance: Normal appearance. She is well-developed and well-groomed. She is morbidly obese. She is not ill-appearing, toxic-appearing or diaphoretic.  HENT:     Head: Normocephalic and atraumatic.     Jaw: There is normal jaw occlusion.     Right Ear: Hearing normal.     Left Ear: Hearing normal.     Nose: Nose normal.     Mouth/Throat:     Lips: Pink.     Mouth: Mucous membranes are moist.     Pharynx: Oropharynx is clear. Uvula midline.  Eyes:     General: Lids are normal.     Extraocular Movements: Extraocular movements intact.     Conjunctiva/sclera: Conjunctivae normal.     Pupils: Pupils are equal, round, and reactive to light.  Neck:     Thyroid: No thyroid mass, thyromegaly or thyroid tenderness.     Vascular: No carotid bruit or JVD.     Trachea: Trachea and phonation normal.  Cardiovascular:     Rate and Rhythm: Normal rate and regular rhythm.     Chest Wall: PMI is not  displaced.     Pulses: Normal pulses.     Heart sounds: Normal heart sounds. No murmur heard.    No friction rub. No gallop.  Pulmonary:     Effort: Pulmonary effort is normal. No respiratory distress.     Breath sounds: Normal breath sounds. No wheezing.  Abdominal:     General: Bowel sounds are normal. There is no distension or abdominal bruit.     Palpations: Abdomen is soft. There is no hepatomegaly or splenomegaly.     Tenderness: There is no abdominal tenderness. There is no right CVA tenderness or left CVA tenderness.     Hernia: No hernia is present.  Musculoskeletal:        General: Normal range of motion.     Cervical back: Normal range of motion and neck supple.     Right lower leg: No edema.     Left lower leg: No edema.  Lymphadenopathy:     Cervical: Cervical adenopathy present.     Right cervical: Superficial cervical adenopathy present.  Skin:    General: Skin is warm and dry.     Capillary Refill: Capillary refill takes less than 2 seconds.     Coloration: Skin is not cyanotic, jaundiced or pale.     Findings: No rash.  Neurological:     General: No focal deficit present.     Mental Status: She is alert and oriented to person, place, and time.     Sensory: Sensation is intact.     Motor: Motor function is intact.     Coordination:  Coordination is intact.     Gait: Gait is intact.     Deep Tendon Reflexes: Reflexes are normal and symmetric.  Psychiatric:        Attention and Perception: Attention and perception normal.        Mood and Affect: Mood and affect normal.        Speech: Speech normal.        Behavior: Behavior normal. Behavior is cooperative.        Thought Content: Thought content normal.        Cognition and Memory: Cognition and memory normal.        Judgment: Judgment normal.     Results for orders placed or performed during the hospital encounter of 07/18/22  EXERCISE TOLERANCE TEST (ETT)  Result Value Ref Range   Angina Index 0    Rest  HR 79.0 bpm   Rest BP 149/103 mmHg   Exercise duration (min) 6 min   Exercise duration (sec) 0 sec   Estimated workload 7.0    Peak HR 139 bpm   Peak BP 191/99 mmHg   MPHR 160 bpm   Percent HR 86.0 %   RPE 15.0    [facility] Treadmill Score 6    ST Depression (mm) 0 mm       Pertinent labs & imaging results that were available during my care of the patient were reviewed by me and considered in my medical decision making.  Assessment & Plan:  Margaret Schmitt was seen today for depression and edema.  Diagnoses and all orders for this visit:  Depression, recurrent ([place]) GAD (generalized anxiety disorder) Took herself off of medications and does not wish to restart. No SI or HI reported.   Morbid obesity ([place]) Referral to weight management.  -     Amb Ref to Medical Weight Management  Lymphadenopathy, cervical Likely from recent illness. Pt aware to report continued or worsening symptoms. If persistent, will obtain imaging.    Continue all other maintenance medications.  Follow up plan: Return in about 3 months (around 03/27/2023), or if symptoms worsen or fail to improve, for chronic follow up .   Continue healthy lifestyle choices, including diet (rich in fruits, vegetables, and lean proteins, and low in salt and simple carbohydrates) and exercise (at least 30 minutes of moderate physical activity daily).  Educational handout given for health maintenance  The above assessment and management plan was discussed with the patient. The patient verbalized understanding of and has agreed to the management plan. Patient is aware to call the clinic if they develop any new symptoms or if symptoms persist or worsen. Patient is aware when to return to the clinic for a follow-up visit. Patient educated on when it is appropriate to go to the emergency department.   Monia Pouch, FNP-C [facility] [phone]

## 2023-02-23 ENCOUNTER — Encounter: Payer: Self-pay | Admitting: Obstetrics and Gynecology

## 2023-02-23 ENCOUNTER — Ambulatory Visit (INDEPENDENT_AMBULATORY_CARE_PROVIDER_SITE_OTHER): Payer: No Typology Code available for payment source | Admitting: Obstetrics and Gynecology

## 2023-02-23 VITALS — BP 171/109 | HR 80 | Ht 65.75 in | Wt 273.0 lb

## 2023-02-23 DIAGNOSIS — R350 Frequency of micturition: Secondary | ICD-10-CM | POA: Diagnosis not present

## 2023-02-23 DIAGNOSIS — N816 Rectocele: Secondary | ICD-10-CM | POA: Diagnosis not present

## 2023-02-23 DIAGNOSIS — N393 Stress incontinence (female) (male): Secondary | ICD-10-CM | POA: Diagnosis not present

## 2023-02-23 DIAGNOSIS — N8189 Other female genital prolapse: Secondary | ICD-10-CM

## 2023-02-23 DIAGNOSIS — R159 Full incontinence of feces: Secondary | ICD-10-CM

## 2023-02-23 LAB — POCT URINALYSIS DIPSTICK
Bilirubin, UA: NEGATIVE
Blood, UA: NEGATIVE
Glucose, UA: NEGATIVE
Ketones, UA: NEGATIVE
Nitrite, UA: NEGATIVE
Protein, UA: NEGATIVE
Spec Grav, UA: >=1.03 — AB (ref 1.010–1.025)
Urobilinogen, UA: 0.2 E.U./dL
pH, UA: 6 (ref 5.0–8.0)

## 2023-02-23 NOTE — Patient Instructions (Addendum)
Accidental Bowel Leakage: Our goal is to achieve formed bowel movements daily or every-other-day without leakage.  You may need to try different combinations of the following options to find what works best for you.  Some management options include: Dietary changes (more leafy greens, vegetables and fruits; less processed foods) Fiber supplementation (Metamucil or something with psyllium as active ingredient) Over-the-counter imodium (tablets or liquid) to help solidify the stool and prevent leakage of stool.   

## 2023-02-23 NOTE — Progress Notes (Signed)
[facility] New Patient Evaluation and Consultation  Referring Provider: Sonny Masters, FNP PCP: Sonny Masters, FNP Date of Service: 02/23/2023  SUBJECTIVE Chief Complaint: New Patient (Initial Visit) Margaret Schmitt is a 62 y.o. female is here for uterine prolapse.)  History of Present Illness: Margaret Schmitt is a 62 y.o. White or Caucasian female seen in consultation at the request of NP Gilford Silvius for evaluation of prolapse.    Review of records significant for: Has bladder prolapse  Urinary Symptoms: Leaks urine with cough/ sneeze, exercise, and during sex. Rare urgency Unsure how often she leaks- cannot always feel it Pad use: 2 liners/ mini-pads per day.   She is bothered by her UI symptoms. No prior treatment for the leakage  Day time voids 5-7.  Nocturia: 1 times per night to void. Voiding dysfunction: she empties her bladder well.  does not use a catheter to empty bladder.  When urinating, she feels dribbling after finishing  UTIs: 1 UTI's in the last year.   Denies history of blood in urine and kidney or bladder stones  Pelvic Organ Prolapse Symptoms:                  She Admits to a feeling of a bulge the vaginal area. It has been present for 20 years.  She Denies seeing a bulge.  This bulge is bothersome. Has a history of prolapse repair with cadaver graft at the time of hysterectomy- felt a bulge shortly after the surgery.   Bowel Symptom: Bowel movements: 2-3 time(s) per day Stool consistency: soft  Straining: yes.  Splinting: no.  Incomplete evacuation: yes.  She Admits to accidental bowel leakage / fecal incontinence  Occurs: about once a week  Consistency with leakage: liquid- due to IBS Bowel regimen: sometimes takes imodium Last colonoscopy: 2017, polyps removed- due for one in 2021.   Sexual Function Sexually active: yes.  Sexual orientation: Straight Pain with sex: No Sex feels "too loose"  Pelvic Pain Denies pelvic  pain   Past Medical History:  Past Medical History:  Diagnosis Date   Asthma    Eczema    Hypertension    Hypertension    Hypothyroidism    Morbid obesity (HCC)    OSA (obstructive sleep apnea)    Thyroid cancer (HCC)    Thyroid disease    hypothyroidism     Past Surgical History:   Past Surgical History:  Procedure Laterality Date   ABDOMINAL HYSTERECTOMY     ADENOIDECTOMY     APPENDECTOMY     CHOLECYSTECTOMY     THYROID LOBECTOMY Left    TONSILLECTOMY     URETERAL REIMPLANTION Left    VAGINAL PROLAPSE REPAIR     used cadaver tissue     Past OB/GYN History: OB History  Gravida Para Term Preterm AB Living  3 3 3     3   SAB IAB Ectopic Multiple Live Births               # Outcome Date GA Lbr Len/2nd Weight Sex Delivery Anes PTL Lv  3 Term      Vag-Spont     2 Term      Vag-Spont     1 Term      Vag-Spont       S/p hysterectomy   Medications: She has a current medication list which includes the following prescription(s): albuterol, furosemide, klor-con m20, levothyroxine, lisinopril, medium chain triglycerides, and triamcinolone.  Allergies: Patient is allergic to erythromycin.   Social History:  Social History   Tobacco Use   Smoking status: Former    Packs/day: .5    Types: Cigarettes    Start date: 08/26/2022   Smokeless tobacco: Never  Vaping Use   Vaping Use: Never used  Substance Use Topics   Alcohol use: Yes    Comment: rarely   Drug use: Not Currently    Relationship status: single She lives alone.   She is employed as a Public affairs consultant. Regular exercise: No History of abuse: No  Family History:   Family History  Problem Relation Age of Onset   Hypertension Mother    Cancer Mother        breast cancer   Hypertension Father    Cancer Father        prostate cancer   Stroke Father    Dementia Father    Stroke Maternal Grandmother      Review of Systems: Review of Systems  Constitutional:  Negative for fever,  malaise/fatigue and weight loss.  Respiratory:  Negative for cough, shortness of breath and wheezing.   Cardiovascular:  Positive for palpitations and leg swelling. Negative for chest pain.  Gastrointestinal:  Negative for abdominal pain and blood in stool.  Genitourinary:  Negative for dysuria.  Musculoskeletal:  Negative for myalgias.  Skin:  Negative for rash.  Neurological:  Negative for dizziness and headaches.  Endo/Heme/Allergies:  Does not bruise/bleed easily.       + hot flashes  Psychiatric/Behavioral:  Negative for depression. The patient is not nervous/anxious.      OBJECTIVE Physical Exam: Vitals:   02/23/23 1056 02/23/23 1104  BP: (!) 151/105 (!) 171/109  Pulse: 84 80  Weight: 273 lb (123.8 kg)   Height: 5' 5.75" (1.67 m)     Physical Exam Constitutional:      General: She is not in acute distress. Pulmonary:     Effort: Pulmonary effort is normal.  Abdominal:     General: There is no distension.     Palpations: Abdomen is soft.     Tenderness: There is no abdominal tenderness. There is no rebound.  Musculoskeletal:        General: No swelling. Normal range of motion.  Skin:    General: Skin is warm and dry.     Findings: No rash.  Neurological:     Mental Status: She is alert and oriented to person, place, and time.  Psychiatric:        Mood and Affect: Mood normal.        Behavior: Behavior normal.      GU / Detailed Urogynecologic Evaluation:  Pelvic Exam: Normal external female genitalia; Bartholin's and Skene's glands normal in appearance; urethral meatus normal in appearance, no urethral masses or discharge.   CST: negative  s/p hysterectomy: Speculum exam reveals normal vaginal mucosa without  atrophy and normal vaginal cuff.  Adnexa normal adnexa.    Pelvic floor strength 0/V, puborectalis 0/V external anal sphincter 0/V- unable to engage pelvis floor muscles, dyssynergia present and she pushes instead of squeezing.   Pelvic floor  musculature: Right levator tender, Right obturator non-tender, Left levator non-tender, Left obturator non-tender  POP-Q:   POP-Q  -2  Aa   -2                                           Ba  -5                                              C   3                                            Gh  4                                            Pb  7                                            tvl   -0.5                                            Ap  -0.5                                            Bp                                                 D      Rectal Exam:  Normal sphincter tone, small distal rectocele, enterocoele not present, no rectal masses, noted dyssynergia when asking the patient to bear down.  Post-Void Residual (PVR) by Bladder Scan: In order to evaluate bladder emptying, we discussed obtaining a postvoid residual and she agreed to this procedure.  Procedure: The ultrasound unit was placed on the patient's abdomen in the suprapubic region after the patient had voided. A PVR of 19 ml was obtained by bladder scan.  Laboratory Results: POC urine: trace leukocytes   ASSESSMENT AND PLAN Ms. Shoun is a 62 y.o. with:  1. Pelvic floor weakness in female   2. Prolapse of posterior vaginal wall   3. SUI (stress urinary incontinence, female)   4. Urinary frequency   5. Incontinence of feces, unspecified fecal incontinence type    Pelvic floor muscle weakness - She is most bothered by this because she feels everything is "too loose" with intercourse - recommended pelvic physical therapy and referral placed.   2. Stage I anterior, Stage II posterior, Stage I apical prolapse - Has history of bladder prolapse repair and overall does not have significant recurrence in this area.  - We discussed that she does have some posterior vaginal wall prolapse but she is not bothered by this. Not interested  in a pessary and does not want  surgery at this time.   3. SUI - For treatment of stress urinary incontinence,  non-surgical options include expectant management, weight loss, physical therapy, as well as a pessary.  Surgical options include a midurethral sling, Burch urethropexy, and transurethral injection of a bulking agent. - She will start with pelvic PT. May be interested in urethral bulking- handout provided.   4. Accidental Bowel Leakage:  - Treatment options include anti-diarrhea medication (loperamide/ Imodium OTC or prescription lomotil), fiber supplements, physical therapy, and possible sacral neuromodulation or surgery.   - Recommended starting daily psyllium fiber supplement for stool bulking and can continue to use imodium as needed - Also discussed that pelvic PT can help improve symptoms as well  Return as needed  Marguerita Beards, MD

## 2023-03-20 ENCOUNTER — Ambulatory Visit: Payer: No Typology Code available for payment source | Admitting: Cardiology

## 2023-03-28 ENCOUNTER — Ambulatory Visit (INDEPENDENT_AMBULATORY_CARE_PROVIDER_SITE_OTHER): Payer: No Typology Code available for payment source | Admitting: Family Medicine

## 2023-03-28 ENCOUNTER — Other Ambulatory Visit (HOSPITAL_COMMUNITY)
Admission: RE | Admit: 2023-03-28 | Discharge: 2023-03-28 | Disposition: A | Discharge status: Home / Self Care | Payer: No Typology Code available for payment source | Source: Ambulatory Visit | Attending: Family Medicine | Admitting: Family Medicine

## 2023-03-28 ENCOUNTER — Encounter: Payer: Self-pay | Admitting: Family Medicine

## 2023-03-28 ENCOUNTER — Other Ambulatory Visit: Payer: Self-pay | Admitting: Family Medicine

## 2023-03-28 ENCOUNTER — Telehealth: Payer: Self-pay | Admitting: Family Medicine

## 2023-03-28 VITALS — BP 176/114 | HR 88 | Temp 97.7°F | Ht 65.75 in | Wt 274.0 lb

## 2023-03-28 DIAGNOSIS — Z113 Encounter for screening for infections with a predominantly sexual mode of transmission: Secondary | ICD-10-CM

## 2023-03-28 DIAGNOSIS — Z0289 Encounter for other administrative examinations: Secondary | ICD-10-CM

## 2023-03-28 DIAGNOSIS — F339 Major depressive disorder, recurrent, unspecified: Secondary | ICD-10-CM

## 2023-03-28 DIAGNOSIS — E890 Postprocedural hypothyroidism: Secondary | ICD-10-CM

## 2023-03-28 DIAGNOSIS — R590 Localized enlarged lymph nodes: Secondary | ICD-10-CM | POA: Diagnosis not present

## 2023-03-28 DIAGNOSIS — F411 Generalized anxiety disorder: Secondary | ICD-10-CM | POA: Diagnosis not present

## 2023-03-28 DIAGNOSIS — I10 Essential (primary) hypertension: Secondary | ICD-10-CM

## 2023-03-28 LAB — BAYER DCA HB A1C WAIVED: HB A1C (BAYER DCA - WAIVED): 6 % — ABNORMAL HIGH (ref 4.8–5.6)

## 2023-03-28 MED ORDER — SEMAGLUTIDE-WEIGHT MANAGEMENT 0.5 MG/0.5ML ~~LOC~~ SOAJ: 0.5000 mg | SUBCUTANEOUS | 0 refills | Status: AC

## 2023-03-28 MED ORDER — SEMAGLUTIDE-WEIGHT MANAGEMENT 2.4 MG/0.75ML ~~LOC~~ SOAJ: 2.4000 mg | SUBCUTANEOUS | 0 refills | Status: DC

## 2023-03-28 MED ORDER — SEMAGLUTIDE-WEIGHT MANAGEMENT 0.25 MG/0.5ML ~~LOC~~ SOAJ: 0.2500 mg | SUBCUTANEOUS | 0 refills | Status: AC

## 2023-03-28 MED ORDER — SEMAGLUTIDE-WEIGHT MANAGEMENT 1.7 MG/0.75ML ~~LOC~~ SOAJ: 1.7000 mg | SUBCUTANEOUS | 0 refills | Status: DC

## 2023-03-28 MED ORDER — SEMAGLUTIDE-WEIGHT MANAGEMENT 1 MG/0.5ML ~~LOC~~ SOAJ: 1.0000 mg | SUBCUTANEOUS | 0 refills | Status: DC

## 2023-03-28 NOTE — Patient Instructions (Signed)

## 2023-03-28 NOTE — Addendum Note (Signed)
Addended by: Sonny Masters on: 03/28/2023 01:50 PM   Modules accepted: Orders

## 2023-03-28 NOTE — Progress Notes (Signed)
Subjective:  Patient ID: Margaret Schmitt, female    DOB: 27-May-1961, 62 y.o.   MRN: [id number]  Patient Care Team: Sonny Masters, FNP as PCP - General (Family Medicine) Wyline Mood Dorothe Pea, MD as PCP - Cardiology (Cardiology)   Chief Complaint:  Medical Management of Chronic Issues (3 month follow up ) and Weight Loss   HPI: Margaret Schmitt is a 62 y.o. female presenting on 03/28/2023 for Medical Management of Chronic Issues (3 month follow up ) and Weight Loss   Follow up for weight loss and Lymph node    1. Depression, recurrent (HCC) Better after dad passed and boyfriend break up  2. GAD (generalized anxiety disorder) Anxiety when coming to MD office and triggers   3. Morbid obesity (HCC) Working out 16m 3 x weekly and interestd in wagovy  4. Lymphadenopathy, cervical Still has R neck lymph node Hx of throid CA in 1996. Last neck US was greater than 2 years ago.  5. Postoperative hypothyroidism Taikes levothyroxine in AM  6. Screening examination for STI Concerned that boyfriend  was not monogamous before break up would like to be tested for STI, HIV, and HEP B/C  7. IBS Pt states she is due for colonoscopy last was 6- years ago non cancerous polyps found   8. Hypertension   Complaint with meds - Yes Current Medications - lisinopril 20mg  PO daily Checking BP at home - yes occasionally 130s /80s average Exercising Regularly - yres 30 min x daily Watching Salt intake - Yes Pertinent ROS:  Headache - No Fatigue - No Visual Disturbances - No Chest pain - No Dyspnea - No Palpitations - Yes see ROS LE edema - No They report good compliance with medications and can restate their regimen by memory. No medication side effects.  BP Readings from Last 3 Encounters:  03/28/23 (!) 176/114  02/23/23 (!) 171/109  12/27/22 (!) 155/105      Relevant past medical, surgical, family, and social history reviewed and updated as indicated.  Allergies and medications  reviewed and updated. Data reviewed: Chart in Epic.   Past Medical History:  Diagnosis Date   Asthma    Eczema    Hypertension    Hypertension    Hypothyroidism    IBS (irritable bowel syndrome)    diagnose when living in [place]   Morbid obesity (HCC)    OSA (obstructive sleep apnea)    Thyroid cancer (HCC)    Thyroid disease    hypothyroidism    Past Surgical History:  Procedure Laterality Date   ABDOMINAL HYSTERECTOMY     ADENOIDECTOMY     APPENDECTOMY     CHOLECYSTECTOMY     THYROID LOBECTOMY Left    TONSILLECTOMY     URETERAL REIMPLANTION Left    VAGINAL PROLAPSE REPAIR     used cadaver tissue    Social History   Socioeconomic History   Marital status: Single    Spouse name: Not on file   Number of children: Not on file   Years of education: Not on file   Highest education level: GED or equivalent  Occupational History   Not on file  Tobacco Use   Smoking status: Former    Types: Cigarettes    Start date: 08/26/2022   Smokeless tobacco: Never  Vaping Use   Vaping Use: Never used  Substance and Sexual Activity   Alcohol use: Yes    Comment: rarely   Drug use: Not Currently  Sexual activity: Not Currently  Other Topics Concern   Not on file  Social History Narrative   Not on file   Social Determinants of Health   Financial Resource Strain: Medium Risk (03/28/2023)   Overall Financial Resource Strain (CARDIA)    Difficulty of Paying Living Expenses: Somewhat hard  Food Insecurity: Food Insecurity Present (03/28/2023)   Hunger Vital Sign    Worried About Running Out of Food in the Last Year: Sometimes true    Ran Out of Food in the Last Year: Never true  Transportation Needs: No Transportation Needs (03/28/2023)   PRAPARE - Administrator, Civil Service (Medical): No    Lack of Transportation (Non-Medical): No  Physical Activity: Insufficiently Active (03/28/2023)   Exercise Vital Sign    Days of Exercise per Week: 3 days    Minutes of  Exercise per Session: 30 min  Stress: No Stress Concern Present (03/28/2023)   [employer] of Occupational Health - Occupational Stress Questionnaire    Feeling of Stress : Only a little  Social Connections: Moderately Isolated (03/28/2023)   Social Connection and Isolation Panel [NHANES]    Frequency of Communication with Friends and Family: More than three times a week    Frequency of Social Gatherings with Friends and Family: Three times a week    Attends Religious Services: More than 4 times per year    Active Member of Clubs or Organizations: No    Attends Banker Meetings: Not on file    Marital Status: Divorced  Intimate Partner Violence: Not on file    Outpatient Encounter Medications as of 03/28/2023  Medication Sig   albuterol (VENTOLIN HFA) 108 (90 Base) MCG/ACT inhaler Inhale 2 puffs into the lungs every 6 (six) hours as needed for wheezing or shortness of breath.   KLOR-CON M20 20 MEQ tablet TAKE 1 TABLET (20 MEQ TOTAL) BY MOUTH AS NEEDED. ONLY WHEN TAKING LASIX 20 MG.   levothyroxine (EUTHYROX) 200 MCG tablet Take 1 tablet (200 mcg total) by mouth daily before breakfast.   lisinopril (ZESTRIL) 20 MG tablet TAKE 1 TABLET BY MOUTH EVERY DAY (INCREASE IN DOSE)   Medium Chain Triglycerides (MCT OIL PO) Take 2,000 mg by mouth daily.   triamcinolone (NASACORT) 55 MCG/ACT AERO nasal inhaler Place 2 sprays into the nose daily.   furosemide (LASIX) 20 MG tablet TAKE 1 TABLET (20 MG TOTAL) BY MOUTH AS NEEDED (FOR SWELLING).   No facility-administered encounter medications on file as of 03/28/2023.    Allergies  Allergen Reactions   Erythromycin Swelling    Review of Systems  Constitutional: Negative.   HENT:  Positive for sore throat (occasinal  secondary to post nasal drip).   Eyes:  Positive for itching (environmental allergies).  Respiratory:         Occasional CPAP wearing  Cardiovascular:  Positive for palpitations (occasinal  followed by Dr Wyline Mood in  [place]).  Gastrointestinal:  Positive for blood in stool (hemorrhoids trace when wiping) and diarrhea (IBS and occasional leakage).  Endocrine: Negative for cold intolerance, heat intolerance, polydipsia, polyphagia and polyuria.  Genitourinary:  Negative for decreased urine volume and difficulty urinating.       Occasional leaking of urine--wears a pad --stress incont  Menopausal LMP 2005  Musculoskeletal:        R knee pain ED visit 2019 swelling and pain . Crutches and NSAID. Occasionally still bothers you. Creaks and popps with active ROM   Skin:  excema  Neurological:        Numbness o toes bilaterally and to L lasteral thigh  All other systems reviewed and are negative.       Objective:  BP (!) 176/114   Pulse 88   Temp 97.7 F (36.5 C) (Temporal)   Ht 5' 5.75" (1.67 m)   Wt 274 lb (124.3 kg)   SpO2 98%   BMI 44.56 kg/m    Wt Readings from Last 3 Encounters:  03/28/23 274 lb (124.3 kg)  02/23/23 273 lb (123.8 kg)  12/27/22 286 lb 9.6 oz (130 kg)    Physical Exam Vitals and nursing note reviewed.  Constitutional:      Appearance: Normal appearance. She is morbidly obese.  HENT:     Head: Normocephalic and atraumatic.     Nose: Nose normal.     Mouth/Throat:     Mouth: Mucous membranes are moist.     Pharynx: Oropharynx is clear.  Eyes:     Extraocular Movements: Extraocular movements intact.     Pupils: Pupils are equal, round, and reactive to light.  Cardiovascular:     Rate and Rhythm: Normal rate and regular rhythm.  Pulmonary:     Effort: Pulmonary effort is normal.     Breath sounds: Normal breath sounds.  Abdominal:     General: Abdomen is flat.     Palpations: Abdomen is soft.  Musculoskeletal:        General: Normal range of motion.     Cervical back: Normal range of motion and neck supple.  Lymphadenopathy:     Cervical: Cervical adenopathy present.  Skin:    General: Skin is warm.     Capillary Refill: Capillary refill takes less than  2 seconds.  Neurological:     General: No focal deficit present.     Mental Status: She is alert.  Psychiatric:        Mood and Affect: Mood normal.        Behavior: Behavior normal.        Judgment: Judgment normal.     Results for orders placed or performed in visit on 02/23/23  POCT Urinalysis Dipstick  Result Value Ref Range   Color, UA yellow    Clarity, UA clear    Glucose, UA Negative Negative   Bilirubin, UA negative    Ketones, UA negative    Spec Grav, UA >=1.030 (A) 1.010 - 1.025   Blood, UA negative    pH, UA 6.0 5.0 - 8.0   Protein, UA Negative Negative   Urobilinogen, UA 0.2 0.2 or 1.0 E.U./dL   Nitrite, UA negative    Leukocytes, UA Trace (A) Negative   Appearance     Odor         Pertinent labs & imaging results that were available during my care of the patient were reviewed by me and considered in my medical decision making.  Assessment & Plan:  Trude was seen today for medical management of chronic issues and weight loss.  Diagnoses and all orders for this visit:  Depression, recurrent (HCC) -     Thyroid Panel With TSH  GAD (generalized anxiety disorder) -     Thyroid Panel With TSH  Morbid obesity (HCC) -     CBC with Differential/Platelet -     CMP14+EGFR -     Bayer DCA Hb A1c Waived -     Thyroid Panel With TSH -     Lipid panel  Lymphadenopathy,  cervical -     US Soft Tissue Head/Neck (NON-THYROID); Future  Postoperative hypothyroidism -     Thyroid Panel With TSH  Screening examination for STI -     HIV antibody (with reflex) -     Hepatitis C Antibody -     Urine cytology ancillary only -     Hepatitis B surface antibody,qualitative  Essential hypertension -     CBC with Differential/Platelet -     CMP14+EGFR -     Bayer DCA Hb A1c Waived -     Thyroid Panel With TSH -     Lipid panel     Continue all other maintenance medications.  Follow up plan: Return in about 3 months (around 06/28/2023) for CPE, chronic follow  up. 1. Depression, recurrent (HCC) Better after dad passed and boyfriend break up  2. GAD (generalized anxiety disorder) Anxiety when coming to MD office and triggers. Decline medication for anxiety and depression.   3. Morbid obesity (HCC) Working out 25m 3 x weekly and interestd in [employer]. Pt to call insurance to verify coverage and will call clinic if covered  4. Lymphadenopathy, cervical Still has R neck lymph node tenderness, Palpable and firn R unilateral submadibular and supraclavicular lymph nodes.  Hx of throid CA in 1996. Last neck US was greater than 2 years ago. [place] ordered.  5. Postoperative hypothyroidism Thyroid pnnel   6. Screening examination for STI Conccerned STI exposure labs for RPR chalmydia GC, HIV, and HEP B/C  7. IBS Pt due for colonoscopy last was 6- years ago non cancerous polyps found will schedule colonoscopy with follow up in 3 months at yearky exan   BP fairly well controlled. Pt states she is always high in MD office.  Changes not made in regimen. Better Bps at Home. Goal BP is 130/80. Pt aware to report any persistent high or low readings. DASH diet and exercise encouraged. Exercise at least 150 minutes per week and increase as tolerated. Goal BMI > 25. Stress management encouraged. Avoid nicotine and tobacco product use. Avoid excessive alcohol and NSAID's. Avoid more than 2000 mg of sodium daily. Medications as prescribed. Follow up as scheduled.    Return to clinic in 3 months for yearly exam. Call   Relevant past medical, surgical, family, and social history reviewed and updated as ind  Continue healthy lifestyle choices, including diet (rich in fruits, vegetables, and lean proteins, and low in salt and simple carbohydrates) and exercise (at least 30 minutes of moderate physical activity daily).  Educational handout given for Dxash Diet  The above assessment and management plan was discussed with the patient. The patient verbalized understanding of  and has agreed to the management plan. Patient is aware to call the clinic if they develop any new symptoms or if symptoms persist or worsen. Patient is aware when to return to the clinic for a follow-up visit. Patient educated on when it is appropriate to go to the emergency department.  Maryelizabeth Kaufmann AGNP student [facility] [phone]   I personally was present during the history, physical exam, and medical decision-making activities of this visit and have verified that the services and findings are accurately documented in the nurse practitioner student's note.  Kari Baars, FNP-C [facility] [address] [place], [place] [address] [phone]

## 2023-03-29 LAB — CBC WITH DIFFERENTIAL/PLATELET
Basophils Absolute: 0.1 10*3/uL (ref 0.0–0.2)
Basos: 1 %
EOS (ABSOLUTE): 0.4 10*3/uL (ref 0.0–0.4)
Eos: 4 %
Hematocrit: 41.3 % (ref 34.0–46.6)
Hemoglobin: 13.5 g/dL (ref 11.1–15.9)
Immature Grans (Abs): 0 10*3/uL (ref 0.0–0.1)
Immature Granulocytes: 0 %
Lymphocytes Absolute: 1.9 10*3/uL (ref 0.7–3.1)
Lymphs: 19 %
MCH: 26.1 pg — ABNORMAL LOW (ref 26.6–33.0)
MCHC: 32.7 g/dL (ref 31.5–35.7)
MCV: 80 fL (ref 79–97)
Monocytes Absolute: 0.7 10*3/uL (ref 0.1–0.9)
Monocytes: 7 %
Neutrophils Absolute: 6.7 10*3/uL (ref 1.4–7.0)
Neutrophils: 69 %
Platelets: 310 10*3/uL (ref 150–450)
RBC: 5.17 x10E6/uL (ref 3.77–5.28)
RDW: 15.4 % (ref 11.7–15.4)
WBC: 9.8 10*3/uL (ref 3.4–10.8)

## 2023-03-29 LAB — CMP14+EGFR
ALT: 12 IU/L (ref 0–32)
AST: 16 IU/L (ref 0–40)
Albumin/Globulin Ratio: 1.8 (ref 1.2–2.2)
Albumin: 4.4 g/dL (ref 3.8–4.9)
Alkaline Phosphatase: 90 IU/L (ref 44–121)
BUN/Creatinine Ratio: 16 (ref 12–28)
BUN: 15 mg/dL (ref 8–27)
Bilirubin Total: 0.2 mg/dL (ref 0.0–1.2)
CO2: 21 mmol/L (ref 20–29)
Calcium: 9.5 mg/dL (ref 8.7–10.3)
Chloride: 104 mmol/L (ref 96–106)
Creatinine, Ser: 0.96 mg/dL (ref 0.57–1.00)
Globulin, Total: 2.5 g/dL (ref 1.5–4.5)
Glucose: 100 mg/dL — ABNORMAL HIGH (ref 70–99)
Potassium: 4.6 mmol/L (ref 3.5–5.2)
Sodium: 141 mmol/L (ref 134–144)
Total Protein: 6.9 g/dL (ref 6.0–8.5)
eGFR: 68 mL/min/{1.73_m2} (ref >59)

## 2023-03-29 LAB — URINE CYTOLOGY ANCILLARY ONLY
Bacterial Vaginitis-Urine: NEGATIVE
Candida Urine: NEGATIVE
Chlamydia: NEGATIVE
Comment: NEGATIVE
Comment: NEGATIVE
Comment: NORMAL
Neisseria Gonorrhea: NEGATIVE
Trichomonas: NEGATIVE

## 2023-03-29 LAB — LIPID PANEL
Chol/HDL Ratio: 3.3 ratio (ref 0.0–4.4)
Cholesterol, Total: 205 mg/dL — ABNORMAL HIGH (ref 100–199)
HDL: 63 mg/dL (ref >39)
LDL Chol Calc (NIH): 125 mg/dL — ABNORMAL HIGH (ref 0–99)
Triglycerides: 98 mg/dL (ref 0–149)
VLDL Cholesterol Cal: 17 mg/dL (ref 5–40)

## 2023-03-29 LAB — THYROID PANEL WITH TSH
Free Thyroxine Index: 2.4 (ref 1.2–4.9)
T3 Uptake Ratio: 26 % (ref 24–39)
T4, Total: 9.3 ug/dL (ref 4.5–12.0)
TSH: 1.74 u[IU]/mL (ref 0.450–4.500)

## 2023-03-29 LAB — HIV ANTIBODY (ROUTINE TESTING W REFLEX): HIV Screen 4th Generation wRfx: NONREACTIVE

## 2023-03-29 LAB — HEPATITIS C ANTIBODY: Hep C Virus Ab: NONREACTIVE

## 2023-03-29 LAB — HEPATITIS B SURFACE ANTIBODY,QUALITATIVE: Hep B Surface Ab, Qual: NONREACTIVE

## 2023-03-29 NOTE — Telephone Encounter (Signed)
  WEGOVY 0.5 MG/0.5ML SOAJ   Pharmacy comment: Alternative Requested:NEEDS PA.   WEGOVY 1 MG/0.5ML SOAJ   Pharmacy comment: Alternative Requested:NOT COVERED NEEDS P.A.

## 2023-03-30 ENCOUNTER — Telehealth: Payer: Self-pay

## 2023-03-30 ENCOUNTER — Other Ambulatory Visit (HOSPITAL_COMMUNITY): Payer: Self-pay

## 2023-03-30 NOTE — Telephone Encounter (Signed)
Pharmacy aware

## 2023-03-30 NOTE — Telephone Encounter (Signed)
Patient Advocate Encounter  Prior Authorization for [employer] 0.25MG /0.5ML auto-injectors has been approved with [employer].    PA# [id number] Effective dates: 03/30/23 through 09/29/23  Per [facility] test claim, copay for 28 days supply is $24.99 (with eVoucher)

## 2023-03-30 NOTE — Telephone Encounter (Signed)
Margaret Schmitt (Key: [id number]) Rx #: [id number] ZHYQMV 0.25MG /0.5ML auto-injectors Form [employer] Electronic Prior Authorization Form (2017 NCPDP) Created 2 days ago Sent to Plan 3 minutes ago Plan Response 3 minutes ago Submit Clinical Questions less than a minute ago Determination

## 2023-04-02 ENCOUNTER — Other Ambulatory Visit (HOSPITAL_COMMUNITY): Payer: Self-pay

## 2023-04-02 NOTE — Telephone Encounter (Signed)
PA has been approved in separate encounter. No PA needed.

## 2023-04-04 ENCOUNTER — Other Ambulatory Visit: Payer: Self-pay | Admitting: Family Medicine

## 2023-04-05 ENCOUNTER — Encounter: Payer: Self-pay | Admitting: Family Medicine

## 2023-04-05 NOTE — Telephone Encounter (Signed)
Patient will need to have an appointment to discuss the [facility] paperwork per [facility].  Called patient to let her know this and she will call back and schedule.  Paperwork is on front office supervisor desk tray.

## 2023-04-06 ENCOUNTER — Ambulatory Visit (INDEPENDENT_AMBULATORY_CARE_PROVIDER_SITE_OTHER): Payer: No Typology Code available for payment source | Admitting: Family Medicine

## 2023-04-06 ENCOUNTER — Encounter: Payer: Self-pay | Admitting: Family Medicine

## 2023-04-06 VITALS — BP 156/74 | HR 88 | Temp 97.7°F | Ht 65.75 in | Wt 274.0 lb

## 2023-04-06 DIAGNOSIS — K580 Irritable bowel syndrome with diarrhea: Secondary | ICD-10-CM

## 2023-04-06 DIAGNOSIS — K589 Irritable bowel syndrome without diarrhea: Secondary | ICD-10-CM | POA: Insufficient documentation

## 2023-04-06 NOTE — Progress Notes (Addendum)
Subjective:  Patient ID: Margaret Schmitt, female    DOB: 03-Jan-1961, 62 y.o.   MRN: [id number]  Patient Care Team: Sonny Masters, FNP as PCP - General (Family Medicine) Wyline Mood Dorothe Pea, MD as PCP - Cardiology (Cardiology)   Chief Complaint:  FMLA (Would like to get on it for IBS. )   HPI: Margaret Schmitt is a 62 y.o. female presenting on 04/06/2023 for FMLA (Would like to get on it for IBS. )   Pt presents today with requests for FMLA paperwork to be completed for IBS. States she was diagnosed with IBS in [place]. Has not seen GI in several years. States she only takes imodium as needed when she has diarrhea. She was referred to GI and did not follow up. States she has 1-2 IBS with diarrhea episodes every 1-2 months and would like FMLA for this. States she has many triggers with large tomatoes being the worst.      Relevant past medical, surgical, family, and social history reviewed and updated as indicated.  Allergies and medications reviewed and updated. Data reviewed: Chart in Epic.   Past Medical History:  Diagnosis Date   Asthma    Eczema    Hypertension    Hypertension    Hypothyroidism    IBS (irritable bowel syndrome)    diagnose when living in [place]   Morbid obesity (HCC)    OSA (obstructive sleep apnea)    Thyroid cancer (HCC)    Thyroid disease    hypothyroidism    Past Surgical History:  Procedure Laterality Date   ABDOMINAL HYSTERECTOMY     ADENOIDECTOMY     APPENDECTOMY     CHOLECYSTECTOMY     THYROID LOBECTOMY Left    TONSILLECTOMY     URETERAL REIMPLANTION Left    VAGINAL PROLAPSE REPAIR     used cadaver tissue    Social History   Socioeconomic History   Marital status: Single    Spouse name: Not on file   Number of children: Not on file   Years of education: Not on file   Highest education level: GED or equivalent  Occupational History   Not on file  Tobacco Use   Smoking status: Former    Types: Cigarettes    Start date:  08/26/2022   Smokeless tobacco: Never  Vaping Use   Vaping Use: Never used  Substance and Sexual Activity   Alcohol use: Yes    Comment: rarely   Drug use: Not Currently   Sexual activity: Not Currently  Other Topics Concern   Not on file  Social History Narrative   Not on file   Social Determinants of Health   Financial Resource Strain: Medium Risk (03/28/2023)   Overall Financial Resource Strain (CARDIA)    Difficulty of Paying Living Expenses: Somewhat hard  Food Insecurity: Food Insecurity Present (03/28/2023)   Hunger Vital Sign    Worried About Running Out of Food in the Last Year: Sometimes true    Ran Out of Food in the Last Year: Never true  Transportation Needs: No Transportation Needs (03/28/2023)   PRAPARE - Administrator, Civil Service (Medical): No    Lack of Transportation (Non-Medical): No  Physical Activity: Insufficiently Active (03/28/2023)   Exercise Vital Sign    Days of Exercise per Week: 3 days    Minutes of Exercise per Session: 30 min  Stress: No Stress Concern Present (03/28/2023)   [employer] of Occupational Health -  Occupational Stress Questionnaire    Feeling of Stress : Only a little  Social Connections: Moderately Isolated (03/28/2023)   Social Connection and Isolation Panel [NHANES]    Frequency of Communication with Friends and Family: More than three times a week    Frequency of Social Gatherings with Friends and Family: Three times a week    Attends Religious Services: More than 4 times per year    Active Member of Clubs or Organizations: No    Attends Banker Meetings: Not on file    Marital Status: Divorced  Intimate Partner Violence: Not on file    Outpatient Encounter Medications as of 04/06/2023  Medication Sig   albuterol (VENTOLIN HFA) 108 (90 Base) MCG/ACT inhaler Inhale 2 puffs into the lungs every 6 (six) hours as needed for wheezing or shortness of breath.   KLOR-CON M20 20 MEQ tablet TAKE 1 TABLET  (20 MEQ TOTAL) BY MOUTH AS NEEDED. ONLY WHEN TAKING LASIX 20 MG.   levothyroxine (EUTHYROX) 200 MCG tablet Take 1 tablet (200 mcg total) by mouth daily before breakfast.   lisinopril (ZESTRIL) 20 MG tablet TAKE 1 TABLET BY MOUTH EVERY DAY (INCREASE IN DOSE)   Medium Chain Triglycerides (MCT OIL PO) Take 2,000 mg by mouth daily.   Semaglutide-Weight Management 0.25 MG/0.5ML SOAJ Inject 0.25 mg into the skin once a week for 28 days.   [START ON 04/26/2023] Semaglutide-Weight Management 0.5 MG/0.5ML SOAJ Inject 0.5 mg into the skin once a week for 28 days.   [START ON 05/25/2023] Semaglutide-Weight Management 1 MG/0.5ML SOAJ Inject 1 mg into the skin once a week for 28 days.   [START ON 06/23/2023] Semaglutide-Weight Management 1.7 MG/0.75ML SOAJ Inject 1.7 mg into the skin once a week for 28 days.   [START ON 07/22/2023] Semaglutide-Weight Management 2.4 MG/0.75ML SOAJ Inject 2.4 mg into the skin once a week for 28 days.   triamcinolone (NASACORT) 55 MCG/ACT AERO nasal inhaler Place 2 sprays into the nose daily.   furosemide (LASIX) 20 MG tablet TAKE 1 TABLET (20 MG TOTAL) BY MOUTH AS NEEDED (FOR SWELLING).   No facility-administered encounter medications on file as of 04/06/2023.    Allergies  Allergen Reactions   Erythromycin Swelling    Review of Systems  Constitutional:  Negative for activity change, appetite change, chills, diaphoresis, fatigue, fever and unexpected weight change.  HENT: Negative.    Eyes: Negative.  Negative for photophobia and visual disturbance.  Respiratory:  Negative for cough, chest tightness and shortness of breath.   Cardiovascular:  Negative for chest pain, palpitations and leg swelling.  Gastrointestinal:  Positive for abdominal pain and diarrhea. Negative for abdominal distention, anal bleeding, blood in stool, constipation, nausea, rectal pain and vomiting.  Endocrine: Negative.   Genitourinary:  Negative for dysuria, frequency and urgency.  Musculoskeletal:   Negative for arthralgias and myalgias.  Skin: Negative.   Allergic/Immunologic: Negative.   Neurological:  Negative for dizziness and headaches.  Hematological: Negative.   Psychiatric/Behavioral:  Negative for confusion, hallucinations, sleep disturbance and suicidal ideas.   All other systems reviewed and are negative.       Objective:  BP (!) 156/74   Pulse 88   Temp 97.7 F (36.5 C) (Temporal)   Ht 5' 5.75" (1.67 m)   Wt 274 lb (124.3 kg)   SpO2 98%   BMI 44.56 kg/m    Wt Readings from Last 3 Encounters:  04/06/23 274 lb (124.3 kg)  03/28/23 274 lb (124.3 kg)  02/23/23 273  lb (123.8 kg)    Physical Exam Vitals and nursing note reviewed.  Constitutional:      Appearance: Normal appearance. She is obese.  HENT:     Head: Normocephalic and atraumatic.     Mouth/Throat:     Mouth: Mucous membranes are moist.  Eyes:     Pupils: Pupils are equal, round, and reactive to light.  Cardiovascular:     Rate and Rhythm: Normal rate.  Pulmonary:     Effort: Pulmonary effort is normal.  Skin:    General: Skin is warm and dry.     Capillary Refill: Capillary refill takes less than 2 seconds.  Neurological:     General: No focal deficit present.     Mental Status: She is alert and oriented to person, place, and time.  Psychiatric:        Mood and Affect: Mood normal.        Thought Content: Thought content normal.        Judgment: Judgment normal.     Results for orders placed or performed in visit on 03/28/23  HIV antibody (with reflex)  Result Value Ref Range   HIV Screen 4th Generation wRfx Non Reactive Non Reactive  Hepatitis C Antibody  Result Value Ref Range   Hep C Virus Ab Non Reactive Non Reactive  CBC with Differential/Platelet  Result Value Ref Range   WBC 9.8 3.4 - 10.8 x10E3/uL   RBC 5.17 3.77 - 5.28 x10E6/uL   Hemoglobin 13.5 11.1 - 15.9 g/dL   Hematocrit 16.1 09.6 - 46.6 %   MCV 80 79 - 97 fL   MCH 26.1 (L) 26.6 - 33.0 pg   MCHC 32.7 31.5 - 35.7  g/dL   RDW 04.5 40.9 - 81.1 %   Platelets 310 150 - 450 x10E3/uL   Neutrophils 69 Not Estab. %   Lymphs 19 Not Estab. %   Monocytes 7 Not Estab. %   Eos 4 Not Estab. %   Basos 1 Not Estab. %   Neutrophils Absolute 6.7 1.4 - 7.0 x10E3/uL   Lymphocytes Absolute 1.9 0.7 - 3.1 x10E3/uL   Monocytes Absolute 0.7 0.1 - 0.9 x10E3/uL   EOS (ABSOLUTE) 0.4 0.0 - 0.4 x10E3/uL   Basophils Absolute 0.1 0.0 - 0.2 x10E3/uL   Immature Granulocytes 0 Not Estab. %   Immature Grans (Abs) 0.0 0.0 - 0.1 x10E3/uL  CMP14+EGFR  Result Value Ref Range   Glucose 100 (H) 70 - 99 mg/dL   BUN 15 8 - 27 mg/dL   Creatinine, Ser 9.14 0.57 - 1.00 mg/dL   eGFR 68 >78 GN/FAO/1.30   BUN/Creatinine Ratio 16 12 - 28   Sodium 141 134 - 144 mmol/L   Potassium 4.6 3.5 - 5.2 mmol/L   Chloride 104 96 - 106 mmol/L   CO2 21 20 - 29 mmol/L   Calcium 9.5 8.7 - 10.3 mg/dL   Total Protein 6.9 6.0 - 8.5 g/dL   Albumin 4.4 3.8 - 4.9 g/dL   Globulin, Total 2.5 1.5 - 4.5 g/dL   Albumin/Globulin Ratio 1.8 1.2 - 2.2   Bilirubin Total 0.2 0.0 - 1.2 mg/dL   Alkaline Phosphatase 90 44 - 121 IU/L   AST 16 0 - 40 IU/L   ALT 12 0 - 32 IU/L  Bayer DCA Hb A1c Waived  Result Value Ref Range   HB A1C (BAYER DCA - WAIVED) 6.0 (H) 4.8 - 5.6 %  Thyroid Panel With TSH  Result Value Ref Range  TSH 1.740 0.450 - 4.500 uIU/mL   T4, Total 9.3 4.5 - 12.0 ug/dL   T3 Uptake Ratio 26 24 - 39 %   Free Thyroxine Index 2.4 1.2 - 4.9  Lipid panel  Result Value Ref Range   Cholesterol, Total 205 (H) 100 - 199 mg/dL   Triglycerides 98 0 - 149 mg/dL   HDL 63 >16 mg/dL   VLDL Cholesterol Cal 17 5 - 40 mg/dL   LDL Chol Calc (NIH) 109 (H) 0 - 99 mg/dL   Chol/HDL Ratio 3.3 0.0 - 4.4 ratio  Hepatitis B surface antibody,qualitative  Result Value Ref Range   Hep B Surface Ab, Qual Non Reactive   Urine cytology ancillary only  Result Value Ref Range   Neisseria Gonorrhea Negative    Chlamydia Negative    Trichomonas Negative    Bacterial  Vaginitis-Urine Negative    Candida Urine Negative    Molecular Comment      For tests bacteria and/or candida, this specimen does not meet the   Molecular Comment      strict criteria set by the FDA. The result interpretation should be   Molecular Comment      considered in conjunction with the patient's clinical history.   Comment Normal Reference Range Trichomonas - Negative    Comment Normal Reference Ranger Chlamydia - Negative    Comment      Normal Reference Range Neisseria Gonorrhea - Negative       Pertinent labs & imaging results that were available during my care of the patient were reviewed by me and considered in my medical decision making.  Assessment & Plan:  Tonea was seen today for fmla.  Diagnoses and all orders for this visit:  Irritable bowel syndrome with diarrhea Discussed the need for GI follow up to evaluate, treat, and manage IBS. GI can determine if FMLA is warranted.  -     Ambulatory referral to Gastroenterology     Continue all other maintenance medications.  Follow up plan: Return if symptoms worsen or fail to improve.   Continue healthy lifestyle choices, including diet (rich in fruits, vegetables, and lean proteins, and low in salt and simple carbohydrates) and exercise (at least 30 minutes of moderate physical activity daily).  Educational handout given for IBS  The above assessment and management plan was discussed with the patient. The patient verbalized understanding of and has agreed to the management plan. Patient is aware to call the clinic if they develop any new symptoms or if symptoms persist or worsen. Patient is aware when to return to the clinic for a follow-up visit. Patient educated on when it is appropriate to go to the emergency department.   Kari Baars, FNP-C [facility] [phone]

## 2023-04-09 ENCOUNTER — Ambulatory Visit (HOSPITAL_COMMUNITY)
Admission: RE | Admit: 2023-04-09 | Discharge: 2023-04-09 | Disposition: A | Discharge status: Home / Self Care | Payer: No Typology Code available for payment source | Source: Ambulatory Visit | Attending: Family Medicine | Admitting: Family Medicine

## 2023-04-09 ENCOUNTER — Telehealth: Payer: Self-pay | Admitting: Family Medicine

## 2023-04-09 ENCOUNTER — Other Ambulatory Visit (HOSPITAL_COMMUNITY): Payer: Self-pay

## 2023-04-09 DIAGNOSIS — R590 Localized enlarged lymph nodes: Secondary | ICD-10-CM | POA: Insufficient documentation

## 2023-04-09 NOTE — Telephone Encounter (Signed)
  WEGOVY 0.25 MG/0.5ML SOAJ   Pharmacy comment: Alternative Requested:NEED PA.   WEGOVY 2.4 MG/0.75ML SOAJ  Pharmacy comment: Alternative Requested:NEED PA.

## 2023-04-09 NOTE — Telephone Encounter (Addendum)
Margaret Schmitt was approved on 03/30/23. Effective dates: 03/30/23 through 09/29/23 . (See encounter on 5/31).   Ran test claims, all strengths went through insurance ([employer]). [employer] does not cover weight loss medication.

## 2023-04-10 ENCOUNTER — Encounter (INDEPENDENT_AMBULATORY_CARE_PROVIDER_SITE_OTHER): Payer: Self-pay | Admitting: *Deleted

## 2023-04-13 ENCOUNTER — Encounter: Payer: Self-pay | Admitting: Family Medicine

## 2023-05-01 NOTE — Therapy (Addendum)
OUTPATIENT PHYSICAL THERAPY FEMALE PELVIC EVALUATION   Patient Name: Margaret Schmitt MRN: [id number] DOB:January 11, 1961, 62 y.o., female Today's Date: 05/02/2023  END OF SESSION:  PT End of Session - 05/02/23 1409     Visit Number 1    Date for PT Re-Evaluation 07/25/23    Authorization Type [employer]    PT Start Time 1402    PT Stop Time 1442    PT Time Calculation (min) 40 min    Activity Tolerance Patient tolerated treatment well    Behavior During Therapy WFL for tasks assessed/performed             Past Medical History:  Diagnosis Date   Asthma    Eczema    Hypertension    Hypertension    Hypothyroidism    IBS (irritable bowel syndrome)    diagnose when living in [place]   Morbid obesity (HCC)    OSA (obstructive sleep apnea)    Thyroid cancer (HCC)    Thyroid disease    hypothyroidism   Past Surgical History:  Procedure Laterality Date   ABDOMINAL HYSTERECTOMY     ADENOIDECTOMY     APPENDECTOMY     CHOLECYSTECTOMY     THYROID LOBECTOMY Left    TONSILLECTOMY     URETERAL REIMPLANTION Left    VAGINAL PROLAPSE REPAIR     used cadaver tissue   Patient Active Problem List   Diagnosis Date Noted   IBS (irritable bowel syndrome) 04/06/2023   Depression, recurrent (HCC) 08/04/2022   GAD (generalized anxiety disorder) 08/04/2022   History of hysterectomy 04/05/2022   History of thyroid cancer 01/03/2022   RLS (restless legs syndrome) 08/13/2020   Circadian rhythm sleep disorder, 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   Hyperlipidemia 07/20/2020   Eczema    OSA (obstructive sleep apnea)    Essential hypertension 08/22/2018   Hypothyroidism 08/22/2018   Morbid obesity (HCC) 08/22/2018    PCP:    Sonny Masters, FNP    REFERRING PROVIDER: Marguerita Beards, MD   REFERRING DIAG: K58.0 (ICD-10-CM) - Irritable bowel syndrome with diarrhea   THERAPY DIAG:  Muscle weakness (generalized)  Unspecified lack of coordination  Abnormal posture  Rationale for Evaluation  and Treatment: Rehabilitation  ONSET DATE: several years  SUBJECTIVE:                                                                                                                                                                                           SUBJECTIVE STATEMENT: Pt is having leakage in bowel and bladder and also decreased sensation during intercourse Fluid intake: Yes: diet soda 2x16 oz, flavored water,  milk (not always)    PAIN:  Are you having pain? No  PRECAUTIONS: None  WEIGHT BEARING RESTRICTIONS: No  FALLS:  Has patient fallen in last 6 months? 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, sitting  PLOF: Independent  PATIENT GOALS: bladder and bowel leakage to stop and feel stronger with pelvic floor  PERTINENT HISTORY:  Hysterectomy, bladder lift 2005,  Sexual abuse: No  BOWEL MOVEMENT: Pain with bowel movement: No Type of bowel movement:Frequency have to go more due to not emptying and Strain Yes, but have IBS-D Fully empty rectum: No Leakage: Yes:   Pads: Yes: 1-2/day Fiber supplement: Yes:    URINATION: Pain with urination: No Fully empty bladder: No Stream:  feel like it takes a while to empty completely Urgency: No Frequency: normal Leakage: Coughing, Sneezing, and movements  Pads: Yes: 2/day  INTERCOURSE: Pain with intercourse:  not feeling as strong (decreased senstaion without pain)   PREGNANCY: Vaginal deliveries 3 large babies Tearing Yes: 4th degree tear C-section deliveries 0 Currently pregnant No  PROLAPSE: None (but I do feel like something feels like it is right at vaginal opening   OBJECTIVE:   DIAGNOSTIC FINDINGS:    PATIENT SURVEYS:     COGNITION: Overall cognitive status: Within functional limits for tasks assessed     SENSATION:  Proprioception: Appears intact  MUSCLE LENGTH: Hamstrings: Right 100 deg; Left 80  deg   LUMBAR SPECIAL TESTS:  ASLR negative  FUNCTIONAL TESTS:    GAIT:  Comments: lateral lean both ways and wide base of support  POSTURE: rounded shoulders, forward head, decreased thoracic kyphosis, anterior pelvic tilt, and scoliosis  PELVIC ALIGNMENT: normal  LUMBARAROM/PROM:  A/PROM A/PROM  eval  Flexion 75%  Extension   Right lateral flexion   Left lateral flexion   Right rotation   Left rotation    (Blank rows = not tested)  LOWER EXTREMITY ROM:  Passive ROM Right eval Left eval  Hip flexion [facility]  [facility]   Hip extension    Hip abduction    Hip adduction    Hip internal rotation [facility]  WFL   Hip external rotation [facility]  [facility]   Knee flexion    Knee extension    Ankle dorsiflexion    Ankle plantarflexion    Ankle inversion    Ankle eversion     (Blank rows = not tested)  LOWER EXTREMITY MMT:  MMT Right eval Left eval  Hip flexion 5/5 5/5  Hip extension    Hip abduction 4/5 4/5  Hip adduction 5/5 5/5  Hip internal rotation    Hip external rotation 4/5 4/5  Knee flexion    Knee extension    Ankle dorsiflexion    Ankle plantarflexion    Ankle inversion    Ankle eversion     PALPATION:   General  tight left lumbar and hamstrings                External Perineal Exam normal appearance                             Internal Pelvic Floor decreased tension of vaginal wall muscles with some fascial tension present; has difficulty creating any closure of the anterior pelvic floor and bulges abdomen when attempts to kegel, needing max cues to get very slight contraction  Patient confirms identification and approves  PT to assess internal pelvic floor and treatment Yes  PELVIC MMT:   MMT eval  Vaginal 1/5 MMT x 1 sec x 1 rep  Internal Anal Sphincter   External Anal Sphincter   Puborectalis   Diastasis Recti   (Blank rows = not tested)        TONE: Higher in the posterior pelvic floor  PROLAPSE: None observable in supine  TODAY'S TREATMENT:                                                                                                                               DATE: 05/02/23  EVAL educated on engaging core correctly without bulging and doing exhale without holding breath - performed 5x   PATIENT EDUCATION:  Education details: educated on exhale and engaged core Person educated: Patient Education method: Programmer, multimedia, Facilities manager, Actor cues, and Verbal cues Education comprehension: verbalized understanding and returned demonstration  HOME EXERCISE PROGRAM: Not given today  ASSESSMENT:  CLINICAL IMPRESSION: Patient is a 62 y.o. female who was seen today for physical therapy evaluation and treatment for bowel and bladder leakage with most concern being bladder leakage.  Pt is post hysterectomy and bladder sling, post menopausal.  Had issues for years but getting worse.  Pt has LE and core weakness with posture impairments. Pt has impaired coordination of the pelvic floor and bulging abdomen with attempt to do kegel.  Pt will benefit from skilled PT to address the impairments and restore maximum function without increase risk of prolapse returning.    OBJECTIVE IMPAIRMENTS: decreased coordination, decreased endurance, decreased strength, increased muscle spasms, impaired tone, and postural dysfunction.   ACTIVITY LIMITATIONS: transfers, continence, and toileting  PARTICIPATION LIMITATIONS: interpersonal relationship and community activity  PERSONAL FACTORS: Time since onset of injury/illness/exacerbation and 1-2 comorbidities: vaginal deliveries x 3 with 4th degree tear, bladder and hysterectomy surgeries   are also affecting patient's functional outcome.   REHAB POTENTIAL: Excellent  CLINICAL DECISION MAKING: Evolving/moderate complexity  EVALUATION COMPLEXITY: Moderate   GOALS: Goals reviewed with patient? Yes  SHORT TERM GOALS: Target date: 05/30/23  Ind with initial HEP Baseline: Goal status: INITIAL  2.  Pt  will report 20% less leakage Baseline:  Goal status: INITIAL  3.  Pt will be able to engage core and pelvic floor correctly without bulging Baseline:  Goal status: INITIAL  LONG TERM GOALS: Target date: 07/25/2023   Pt will be independent with advanced HEP to maintain improvements made throughout therapy  Baseline:  Goal status: INITIAL  2.  Pt will be able to functional actions such as coughing, sneezing, and position changes without leakage  Baseline:  Goal status: INITIAL  3.  Pt will demonstrate 5/5 hip abduction and external rotation for improved functional activities without leakage Baseline:  Goal status: INITIAL  4.  Pt will be able to have improved sensation due to health of pelvic floor muscles in order to regain better control of all pelvic floor functions  Baseline:  Goal status: INITIAL   PLAN:  PT FREQUENCY: 1x/week  PT DURATION: 12 weeks  PLANNED INTERVENTIONS: Therapeutic exercises, Therapeutic activity, Neuromuscular re-education, Balance training, Gait training, Patient/Family education, Self Care, Joint mobilization, Dry Needling, Electrical stimulation, Spinal manipulation, Spinal mobilization, Cryotherapy, Moist heat, scar mobilization, Taping, Traction, Biofeedback, Manual therapy, and Re-evaluation  PLAN FOR NEXT SESSION: core and hip strength with working on not bulging; use RUSI in next 1-2 visits - transperineal view to work on lifting bladder and sphincter muscle tone   [employer], PT 05/02/2023, 3:22 PM   PHYSICAL THERAPY DISCHARGE SUMMARY  Visits from Start of Care: 1  Current functional level related to goals / functional outcomes: One visit only   Remaining deficits: One time visit only   Education / Equipment: See above   Patient agrees to discharge. Patient goals were not met. Patient is being discharged due to not returning since the last visit.  Russella Dar, PT, DPT 06/01/23 12:54 PM

## 2023-05-02 ENCOUNTER — Other Ambulatory Visit: Payer: Self-pay

## 2023-05-02 ENCOUNTER — Encounter: Payer: Self-pay | Admitting: Physical Therapy

## 2023-05-02 ENCOUNTER — Ambulatory Visit
Payer: No Typology Code available for payment source | Attending: Obstetrics and Gynecology | Admitting: Physical Therapy

## 2023-05-02 DIAGNOSIS — R279 Unspecified lack of coordination: Secondary | ICD-10-CM

## 2023-05-02 DIAGNOSIS — M6281 Muscle weakness (generalized): Secondary | ICD-10-CM | POA: Diagnosis present

## 2023-05-02 DIAGNOSIS — R293 Abnormal posture: Secondary | ICD-10-CM

## 2023-05-04 ENCOUNTER — Encounter: Payer: Self-pay | Admitting: Family Medicine

## 2023-05-07 NOTE — Therapy (Deleted)
OUTPATIENT PHYSICAL THERAPY FEMALE PELVIC Treatment   Patient Name: Margaret Schmitt MRN: [id number] DOB:June 07, 1961, 62 y.o., female Today's Date: 05/07/2023  END OF SESSION:    Past Medical History:  Diagnosis Date   Asthma    Eczema    Hypertension    Hypertension    Hypothyroidism    IBS (irritable bowel syndrome)    diagnose when living in [place]   Morbid obesity (HCC)    OSA (obstructive sleep apnea)    Thyroid cancer (HCC)    Thyroid disease    hypothyroidism   Past Surgical History:  Procedure Laterality Date   ABDOMINAL HYSTERECTOMY     ADENOIDECTOMY     APPENDECTOMY     CHOLECYSTECTOMY     THYROID LOBECTOMY Left    TONSILLECTOMY     URETERAL REIMPLANTION Left    VAGINAL PROLAPSE REPAIR     used cadaver tissue   Patient Active Problem List   Diagnosis Date Noted   IBS (irritable bowel syndrome) 04/06/2023   Depression, recurrent (HCC) 08/04/2022   GAD (generalized anxiety disorder) 08/04/2022   History of hysterectomy 04/05/2022   History of thyroid cancer 01/03/2022   RLS (restless legs syndrome) 08/13/2020   Circadian rhythm sleep disorder, 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   Hyperlipidemia 07/20/2020   Eczema    OSA (obstructive sleep apnea)    Essential hypertension 08/22/2018   Hypothyroidism 08/22/2018   Morbid obesity (HCC) 08/22/2018    PCP:    Sonny Masters, FNP    REFERRING PROVIDER: Marguerita Beards, MD   REFERRING DIAG: K58.0 (ICD-10-CM) - Irritable bowel syndrome with diarrhea   THERAPY DIAG:  No diagnosis found.  Rationale for Evaluation and Treatment: Rehabilitation  ONSET DATE: several years  SUBJECTIVE:                                                                                                                                                                                           SUBJECTIVE STATEMENT: Pt is having leakage in bowel and bladder and also decreased sensation during intercourse Fluid intake: Yes: diet soda  2x16 oz, flavored water, milk (not always)    PAIN:  Are you having pain? No  PRECAUTIONS: None  WEIGHT BEARING RESTRICTIONS: No  FALLS:  Has patient fallen in last 6 months? 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, sitting  PLOF: Independent  PATIENT GOALS: bladder and bowel leakage to stop and feel stronger with pelvic floor  PERTINENT HISTORY:  Hysterectomy, bladder lift 2005,  Sexual abuse: No  BOWEL MOVEMENT: Pain with  bowel movement: No Type of bowel movement:Frequency have to go more due to not emptying and Strain Yes, but have IBS-D Fully empty rectum: No Leakage: Yes:   Pads: Yes: 1-2/day Fiber supplement: Yes:    URINATION: Pain with urination: No Fully empty bladder: No Stream:  feel like it takes a while to empty completely Urgency: No Frequency: normal Leakage: Coughing, Sneezing, and movements  Pads: Yes: 2/day  INTERCOURSE: Pain with intercourse:  not feeling as strong (decreased senstaion without pain)   PREGNANCY: Vaginal deliveries 3 large babies Tearing Yes: 4th degree tear C-section deliveries 0 Currently pregnant No  PROLAPSE: None (but I do feel like something feels like it is right at vaginal opening   OBJECTIVE:   DIAGNOSTIC FINDINGS:    PATIENT SURVEYS:     COGNITION: Overall cognitive status: Within functional limits for tasks assessed     SENSATION:  Proprioception: Appears intact  MUSCLE LENGTH: Hamstrings: Right 100 deg; Left 80 deg   LUMBAR SPECIAL TESTS:  ASLR negative  FUNCTIONAL TESTS:    GAIT:  Comments: lateral lean both ways and wide base of support  POSTURE: rounded shoulders, forward head, decreased thoracic kyphosis, anterior pelvic tilt, and scoliosis  PELVIC ALIGNMENT: normal  LUMBARAROM/PROM:  A/PROM A/PROM  eval  Flexion 75%  Extension   Right lateral flexion   Left lateral flexion    Right rotation   Left rotation    (Blank rows = not tested)  LOWER EXTREMITY ROM:  Passive ROM Right eval Left eval  Hip flexion [facility]  [facility]   Hip extension    Hip abduction    Hip adduction    Hip internal rotation [facility]  WFL   Hip external rotation [facility]  [facility]   Knee flexion    Knee extension    Ankle dorsiflexion    Ankle plantarflexion    Ankle inversion    Ankle eversion     (Blank rows = not tested)  LOWER EXTREMITY MMT:  MMT Right eval Left eval  Hip flexion 5/5 5/5  Hip extension    Hip abduction 4/5 4/5  Hip adduction 5/5 5/5  Hip internal rotation    Hip external rotation 4/5 4/5  Knee flexion    Knee extension    Ankle dorsiflexion    Ankle plantarflexion    Ankle inversion    Ankle eversion     PALPATION:   General  tight left lumbar and hamstrings                External Perineal Exam normal appearance                             Internal Pelvic Floor decreased tension of vaginal wall muscles with some fascial tension present; has difficulty creating any closure of the anterior pelvic floor and bulges abdomen when attempts to kegel, needing max cues to get very slight contraction  Patient confirms identification and approves PT to assess internal pelvic floor and treatment Yes  PELVIC MMT:   MMT eval  Vaginal 1/5 MMT x 1 sec x 1 rep  Internal Anal Sphincter   External Anal Sphincter   Puborectalis   Diastasis Recti   (Blank rows = not tested)        TONE: Higher in the posterior pelvic floor  PROLAPSE: None observable in supine  TODAY'S TREATMENT:  DATE: 05/02/23  EVAL educated on engaging core correctly without bulging and doing exhale without holding breath - performed 5x   PATIENT EDUCATION:  Education details: educated on exhale and engaged core Person educated: Patient Education method: Programmer, multimedia,  Facilities manager, Actor cues, and Verbal cues Education comprehension: verbalized understanding and returned demonstration  HOME EXERCISE PROGRAM: Not given today  ASSESSMENT:  CLINICAL IMPRESSION: Patient is a 62 y.o. female who was seen today for physical therapy evaluation and treatment for bowel and bladder leakage with most concern being bladder leakage.  Pt is post hysterectomy and bladder sling, post menopausal.  Had issues for years but getting worse.  Pt has LE and core weakness with posture impairments. Pt has impaired coordination of the pelvic floor and bulging abdomen with attempt to do kegel.  Pt will benefit from skilled PT to address the impairments and restore maximum function without increase risk of prolapse returning.    OBJECTIVE IMPAIRMENTS: decreased coordination, decreased endurance, decreased strength, increased muscle spasms, impaired tone, and postural dysfunction.   ACTIVITY LIMITATIONS: transfers, continence, and toileting  PARTICIPATION LIMITATIONS: interpersonal relationship and community activity  PERSONAL FACTORS: Time since onset of injury/illness/exacerbation and 1-2 comorbidities: vaginal deliveries x 3 with 4th degree tear, bladder and hysterectomy surgeries   are also affecting patient's functional outcome.   REHAB POTENTIAL: Excellent  CLINICAL DECISION MAKING: Evolving/moderate complexity  EVALUATION COMPLEXITY: Moderate   GOALS: Goals reviewed with patient? Yes  SHORT TERM GOALS: Target date: 05/30/23  Ind with initial HEP Baseline: Goal status: INITIAL  2.  Pt will report 20% less leakage Baseline:  Goal status: INITIAL  3.  Pt will be able to engage core and pelvic floor correctly without bulging Baseline:  Goal status: INITIAL  LONG TERM GOALS: Target date: 07/25/2023   Pt will be independent with advanced HEP to maintain improvements made throughout therapy  Baseline:  Goal status: INITIAL  2.  Pt will be able to functional  actions such as coughing, sneezing, and position changes without leakage  Baseline:  Goal status: INITIAL  3.  Pt will demonstrate 5/5 hip abduction and external rotation for improved functional activities without leakage Baseline:  Goal status: INITIAL  4.  Pt will be able to have improved sensation due to health of pelvic floor muscles in order to regain better control of all pelvic floor functions Baseline:  Goal status: INITIAL   PLAN:  PT FREQUENCY: 1x/week  PT DURATION: 12 weeks  PLANNED INTERVENTIONS: Therapeutic exercises, Therapeutic activity, Neuromuscular re-education, Balance training, Gait training, Patient/Family education, Self Care, Joint mobilization, Dry Needling, Electrical stimulation, Spinal manipulation, Spinal mobilization, Cryotherapy, Moist heat, scar mobilization, Taping, Traction, Biofeedback, Manual therapy, and Re-evaluation  PLAN FOR NEXT SESSION: core and hip strength with working on not bulging; use RUSI in next 1-2 visits - transperineal view to work on lifting bladder and sphincter muscle tone   [employer], PT 05/07/2023, 5:55 PM

## 2023-05-08 ENCOUNTER — Ambulatory Visit: Payer: No Typology Code available for payment source | Admitting: Physical Therapy

## 2023-05-16 ENCOUNTER — Ambulatory Visit: Payer: No Typology Code available for payment source | Admitting: Physical Therapy

## 2023-05-30 ENCOUNTER — Encounter: Payer: Self-pay | Admitting: Cardiology

## 2023-05-30 ENCOUNTER — Other Ambulatory Visit: Payer: Self-pay | Admitting: Cardiology

## 2023-05-30 DIAGNOSIS — I10 Essential (primary) hypertension: Secondary | ICD-10-CM

## 2023-05-30 MED ORDER — LISINOPRIL 20 MG PO TABS
20.0000 mg | ORAL_TABLET | Freq: Two times a day (BID) | ORAL | 1 refills | Status: DC
Start: 2023-05-30 — End: 2023-12-13

## 2023-05-31 ENCOUNTER — Encounter (INDEPENDENT_AMBULATORY_CARE_PROVIDER_SITE_OTHER): Payer: Self-pay

## 2023-06-05 ENCOUNTER — Encounter (INDEPENDENT_AMBULATORY_CARE_PROVIDER_SITE_OTHER): Payer: Self-pay

## 2023-06-05 ENCOUNTER — Telehealth (INDEPENDENT_AMBULATORY_CARE_PROVIDER_SITE_OTHER): Payer: Self-pay | Admitting: Gastroenterology

## 2023-06-05 ENCOUNTER — Ambulatory Visit (INDEPENDENT_AMBULATORY_CARE_PROVIDER_SITE_OTHER): Payer: No Typology Code available for payment source | Admitting: Gastroenterology

## 2023-06-05 ENCOUNTER — Encounter (INDEPENDENT_AMBULATORY_CARE_PROVIDER_SITE_OTHER): Payer: Self-pay | Admitting: Gastroenterology

## 2023-06-05 VITALS — BP 158/116 | HR 84 | Temp 98.7°F | Ht 65.75 in | Wt 271.3 lb

## 2023-06-05 DIAGNOSIS — K580 Irritable bowel syndrome with diarrhea: Secondary | ICD-10-CM

## 2023-06-05 DIAGNOSIS — Z860100 Personal history of colon polyps, unspecified: Secondary | ICD-10-CM | POA: Insufficient documentation

## 2023-06-05 DIAGNOSIS — K625 Hemorrhage of anus and rectum: Secondary | ICD-10-CM | POA: Diagnosis not present

## 2023-06-05 MED ORDER — CLENPIQ 10-3.5-12 MG-GM -GM/175ML PO SOLN: 1.0000 | ORAL | 0 refills | Status: DC

## 2023-06-05 NOTE — Patient Instructions (Signed)
I am providing the low FODMAP diet You should keep a food journal for the next few weeks to help correlate if symptoms are related to certain foods You can try eliminating FODMAPS listed on the provided handout. If you notice no change in your symptoms, you can slowly start adding these foods/ingredients back in to see if they are in fact tolerated. You should avoid foods that tend to illicit your symptoms. Stress can also play a big role in IBS, so stress management is important   We will get you scheduled for colonoscopy  Follow up 6 months

## 2023-06-05 NOTE — Progress Notes (Signed)
Referring Provider: Sonny Masters, FNP Primary Care Physician:  Sonny Masters, FNP Primary GI Physician: new (Dr. Tasia Catchings)  Chief Complaint  Patient presents with   Irritable Bowel Syndrome    Referred for IBS with diarrhea. States its been over 20 years. Has diarrhea 1-2 days every 2 months. Takes imodium as needed. Sometimes it helps and sometimes it does not help. States her GI in [place] told her not to take imoidum and it does make her stomach hurt sometimes. Has history of diverticulitis.    HPI:   Margaret Schmitt is a 62 y.o. female with past medical history of asthma, eczema, diverticulitis, HTN, Hypothyroidism, IBS, OSA, thyroid cancer   Patient presenting today as a new patient for IBS-D and to schedule colonoscopy.  Recent labs with normal thyroid panel, CBC, CMP in May 2024  Patient states last colonoscopy was in 2017, right before she left [place], she notes that she had a polyp then and had had previous polyps moved on other colonoscopies in the past, she thinks she was due in 2021, noting she was supposed to have a Colonoscopy at that time with [facility] but due to work she was unable to do this.   She notes she has history of chronic/intermittent diarrhea that began around 20 years ago, diagnosed with IBS-D when she lived in [place]. Typically has a BM 2-3x/day. She reports 1-2 days every 1-2 months,  she has diarrhea and will have fecal urgency and usually diarrhea will last throughout the day. Even on good days, she notes stools are always fairly soft and requires a lot of wiping, also with some fecal seepage. She sometimes has some mucus in her stools as well. Has occasional lower abdominal cramping with her diarrhea, more LLQ at times. She takes imodium PRN when she has diarrhea, sometimes with good results but not always. Has not tried anything else for her symptoms.  She sees some occasional blood in the toilet, and notes she has a small external hemorrhoid that sometimes  tends to bleed if she wipes too hard. She has occasional nausea but no vomiting. She notes certain foods such as tomatoes tend to give her more diarrhea but she is not willing to stop eating trigger foods. No early satiety or unintentional weight loss. She is s/p cholecystectomy in the 1990s.   NSAID use: just tylenol  Social hx: very rare alcohol, former light  cigarette smoker  Fam hx: no CRC but father had partial colectomy (unsure of reason)  Last Colonoscopy: 2017-polyp per patient. Last Endoscopy: never   Recommendations:    Past Medical History:  Diagnosis Date   Asthma    Eczema    History of diverticulitis    Hypertension    Hypertension    Hypothyroidism    IBS (irritable bowel syndrome)    diagnose when living in [place]   Morbid obesity (HCC)    OSA (obstructive sleep apnea)    Thyroid cancer (HCC)    Thyroid disease    hypothyroidism    Past Surgical History:  Procedure Laterality Date   ABDOMINAL HYSTERECTOMY     ADENOIDECTOMY     APPENDECTOMY     CHOLECYSTECTOMY     COLONOSCOPY     around 2016. removed polyps.pt thinks she  was to repeat in 5 years. tcs done in [place].   THYROID LOBECTOMY Left    TONSILLECTOMY     URETERAL REIMPLANTION Left    VAGINAL PROLAPSE REPAIR     used  cadaver tissue    Current Outpatient Medications  Medication Sig Dispense Refill   albuterol (VENTOLIN HFA) 108 (90 Base) MCG/ACT inhaler Inhale 2 puffs into the lungs every 6 (six) hours as needed for wheezing or shortness of breath. 18 g 2   furosemide (LASIX) 20 MG tablet TAKE 1 TABLET (20 MG TOTAL) BY MOUTH AS NEEDED (FOR SWELLING). 90 tablet 1   KLOR-CON M20 20 MEQ tablet TAKE 1 TABLET (20 MEQ TOTAL) BY MOUTH AS NEEDED. ONLY WHEN TAKING LASIX 20 MG. 90 tablet 1   levothyroxine (EUTHYROX) 200 MCG tablet Take 1 tablet (200 mcg total) by mouth daily before breakfast. 90 tablet 3   lisinopril (ZESTRIL) 20 MG tablet Take 1 tablet (20 mg total) by mouth 2 (two) times daily at 10 AM and  5 PM. 180 tablet 1   Medium Chain Triglycerides (MCT OIL PO) Take 2,000 mg by mouth daily.     Semaglutide-Weight Management 1 MG/0.5ML SOAJ Inject 1 mg into the skin once a week for 28 days. 2 mL 0   [START ON 06/23/2023] Semaglutide-Weight Management 1.7 MG/0.75ML SOAJ Inject 1.7 mg into the skin once a week for 28 days. 3 mL 0   [START ON 07/22/2023] Semaglutide-Weight Management 2.4 MG/0.75ML SOAJ Inject 2.4 mg into the skin once a week for 28 days. 3 mL 0   triamcinolone (NASACORT) 55 MCG/ACT AERO nasal inhaler Place 2 sprays into the nose daily.     No current facility-administered medications for this visit.    Allergies as of 06/05/2023 - Review Complete 04/06/2023  Allergen Reaction Noted   Erythromycin Swelling 08/15/2018    Family History  Problem Relation Age of Onset   Hypertension Mother    Cancer Mother        breast cancer   Hypertension Father    Cancer Father        prostate cancer   Stroke Father    Dementia Father    Stroke Maternal Grandmother     Social History   Socioeconomic History   Marital status: Single    Spouse name: Not on file   Number of children: Not on file   Years of education: Not on file   Highest education level: GED or equivalent  Occupational History   Not on file  Tobacco Use   Smoking status: Former    Types: Cigarettes    Start date: 08/26/2022    Passive exposure: Past   Smokeless tobacco: Never  Vaping Use   Vaping status: Never Used  Substance and Sexual Activity   Alcohol use: Yes    Comment: rarely - once a year.   Drug use: Never   Sexual activity: Not Currently  Other Topics Concern   Not on file  Social History Narrative   Not on file   Social Determinants of Health   Financial Resource Strain: Medium Risk (03/28/2023)   Overall Financial Resource Strain (CARDIA)    Difficulty of Paying Living Expenses: Somewhat hard  Food Insecurity: Food Insecurity Present (03/28/2023)   Hunger Vital Sign    Worried About  Running Out of Food in the Last Year: Sometimes true    Ran Out of Food in the Last Year: Never true  Transportation Needs: No Transportation Needs (03/28/2023)   PRAPARE - Administrator, Civil Service (Medical): No    Lack of Transportation (Non-Medical): No  Physical Activity: Insufficiently Active (03/28/2023)   Exercise Vital Sign    Days of Exercise per Week:  3 days    Minutes of Exercise per Session: 30 min  Stress: No Stress Concern Present (03/28/2023)   [employer] of Occupational Health - Occupational Stress Questionnaire    Feeling of Stress : Only a little  Social Connections: Moderately Isolated (03/28/2023)   Social Connection and Isolation Panel [NHANES]    Frequency of Communication with Friends and Family: More than three times a week    Frequency of Social Gatherings with Friends and Family: Three times a week    Attends Religious Services: More than 4 times per year    Active Member of Clubs or Organizations: No    Attends Engineer, structural: Not on file    Marital Status: Divorced    Review of systems General: negative for malaise, night sweats, fever, chills, weight loss Neck: Negative for lumps, goiter, pain and significant neck swelling Resp: Negative for cough, wheezing, dyspnea at rest CV: Negative for chest pain, leg swelling, palpitations, orthopnea GI: denies melena, hematochezia, nausea, vomiting, constipation, dysphagia, odyonophagia, early satiety or unintentional weight loss. +diarrhea +rectal bleeding +lower abdominal cramping MSK: Negative for joint pain or swelling, back pain, and muscle pain. Derm: Negative for itching or rash Psych: Denies depression, anxiety, memory loss, confusion. No homicidal or suicidal ideation.  Heme: Negative for prolonged bleeding, bruising easily, and swollen nodes. Endocrine: Negative for cold or heat intolerance, polyuria, polydipsia and goiter. Neuro: negative for tremor, gait imbalance,  syncope and seizures. The remainder of the review of systems is noncontributory.  Physical Exam: BP (!) 155/101 (BP Location: Left Arm, Patient Position: Sitting, Cuff Size: Large)   Pulse 84   Temp 98.7 F (37.1 C) (Oral)   Ht 5' 5.75" (1.67 m)   Wt 271 lb 4.8 oz (123.1 kg)   BMI 44.12 kg/m  General:   Alert and oriented. No distress noted. Pleasant and cooperative.  Head:  Normocephalic and atraumatic. Eyes:  Conjuctiva clear without scleral icterus. Mouth:  Oral mucosa pink and moist. Good dentition. No lesions. Heart: Normal rate and rhythm, s1 and s2 heart sounds present.  Lungs: Clear lung sounds in all lobes. Respirations equal and unlabored. Abdomen:  +BS, soft, non-tender and non-distended. No rebound or guarding. No HSM or masses noted. Derm: No palmar erythema or jaundice Msk:  Symmetrical without gross deformities. Normal posture. Extremities:  Without edema. Neurologic:  Alert and  oriented x4 Psych:  Alert and cooperative. Normal mood and affect.  Invalid input(s): "6 MONTHS"   ASSESSMENT: Niya Arnoux is a 62 y.o. female presenting today as a new patient for IBS-D and to schedule colonoscopy  IBS-D: reported 20 year history chronic/intermittent diarrhea with a few days every 1-2 months of diarrhea and some lower abdominal cramping. She takes imodium PRN which helps some. She notes she does not care to avoid trigger foods. I did offer her a trial of bentyl to which she declined. Will provide low FODMAP food guide and instructed her on good stress management as well. She can let me know if she wishes to try antispasmodic. Patient did inquire about FMLA papers for her IBS, however, I advised her she would need to discuss further with her PCP as we do not typically do FMLA for IBS, especially given her intermittent symptoms.   Colon polyps: last colonoscopy in 2017 with polyps, per patient. This was done in [place] and no records available currently for review, however,  patient notes she was advised to have colonoscopy every 5 years. She has some occasional  rectal bleeding, though also notes presence of hemorrhoids. Recommend scheduling colonoscopy at this time. Indications, risks and benefits of procedure discussed in detail with patient. Patient verbalized understanding and is in agreement to proceed with Colonoscopy.    PLAN:  Schedule colonoscopy-ASA III  2.  Continue imodium PRN  3. Consider bentyl 10mg  BID PRN, pt to make me aware if she wishes to try  4. Low FODMAP food guide, good stress management 5. Obtain records from previous GI in [place]  All questions were answered, patient verbalized understanding and is in agreement with plan as outlined above.   Follow Up: 6 months    L. Jeanmarie Hubert, MSN, APRN, AGNP-C Adult-Gerontology Nurse Practitioner [facility]

## 2023-06-05 NOTE — H&P (View-Only) (Signed)
 Referring Provider: Sonny Masters, FNP Primary Care Physician:  Sonny Masters, FNP Primary GI Physician: new (Dr. Tasia Catchings)  Chief Complaint  Patient presents with   Irritable Bowel Syndrome    Referred for IBS with diarrhea. States its been over 20 years. Has diarrhea 1-2 days every 2 months. Takes imodium as needed. Sometimes it helps and sometimes it does not help. States her GI in [place] told her not to take imoidum and it does make her stomach hurt sometimes. Has history of diverticulitis.    HPI:   Margaret Schmitt is a 62 y.o. female with past medical history of asthma, eczema, diverticulitis, HTN, Hypothyroidism, IBS, OSA, thyroid cancer   Patient presenting today as a new patient for IBS-D and to schedule colonoscopy.  Recent labs with normal thyroid panel, CBC, CMP in May 2024  Patient states last colonoscopy was in 2017, right before she left [place], she notes that she had a polyp then and had had previous polyps moved on other colonoscopies in the past, she thinks she was due in 2021, noting she was supposed to have a Colonoscopy at that time with [facility] but due to work she was unable to do this.   She notes she has history of chronic/intermittent diarrhea that began around 20 years ago, diagnosed with IBS-D when she lived in [place]. Typically has a BM 2-3x/day. She reports 1-2 days every 1-2 months,  she has diarrhea and will have fecal urgency and usually diarrhea will last throughout the day. Even on good days, she notes stools are always fairly soft and requires a lot of wiping, also with some fecal seepage. She sometimes has some mucus in her stools as well. Has occasional lower abdominal cramping with her diarrhea, more LLQ at times. She takes imodium PRN when she has diarrhea, sometimes with good results but not always. Has not tried anything else for her symptoms.  She sees some occasional blood in the toilet, and notes she has a small external hemorrhoid that sometimes  tends to bleed if she wipes too hard. She has occasional nausea but no vomiting. She notes certain foods such as tomatoes tend to give her more diarrhea but she is not willing to stop eating trigger foods. No early satiety or unintentional weight loss. She is s/p cholecystectomy in the 1990s.   NSAID use: just tylenol  Social hx: very rare alcohol, former light  cigarette smoker  Fam hx: no CRC but father had partial colectomy (unsure of reason)  Last Colonoscopy: 2017-polyp per patient. Last Endoscopy: never   Recommendations:    Past Medical History:  Diagnosis Date   Asthma    Eczema    History of diverticulitis    Hypertension    Hypertension    Hypothyroidism    IBS (irritable bowel syndrome)    diagnose when living in [place]   Morbid obesity (HCC)    OSA (obstructive sleep apnea)    Thyroid cancer (HCC)    Thyroid disease    hypothyroidism    Past Surgical History:  Procedure Laterality Date   ABDOMINAL HYSTERECTOMY     ADENOIDECTOMY     APPENDECTOMY     CHOLECYSTECTOMY     COLONOSCOPY     around 2016. removed polyps.pt thinks she  was to repeat in 5 years. tcs done in [place].   THYROID LOBECTOMY Left    TONSILLECTOMY     URETERAL REIMPLANTION Left    VAGINAL PROLAPSE REPAIR     used  cadaver tissue    Current Outpatient Medications  Medication Sig Dispense Refill   albuterol (VENTOLIN HFA) 108 (90 Base) MCG/ACT inhaler Inhale 2 puffs into the lungs every 6 (six) hours as needed for wheezing or shortness of breath. 18 g 2   furosemide (LASIX) 20 MG tablet TAKE 1 TABLET (20 MG TOTAL) BY MOUTH AS NEEDED (FOR SWELLING). 90 tablet 1   KLOR-CON M20 20 MEQ tablet TAKE 1 TABLET (20 MEQ TOTAL) BY MOUTH AS NEEDED. ONLY WHEN TAKING LASIX 20 MG. 90 tablet 1   levothyroxine (EUTHYROX) 200 MCG tablet Take 1 tablet (200 mcg total) by mouth daily before breakfast. 90 tablet 3   lisinopril (ZESTRIL) 20 MG tablet Take 1 tablet (20 mg total) by mouth 2 (two) times daily at 10 AM and  5 PM. 180 tablet 1   Medium Chain Triglycerides (MCT OIL PO) Take 2,000 mg by mouth daily.     Semaglutide-Weight Management 1 MG/0.5ML SOAJ Inject 1 mg into the skin once a week for 28 days. 2 mL 0   [START ON 06/23/2023] Semaglutide-Weight Management 1.7 MG/0.75ML SOAJ Inject 1.7 mg into the skin once a week for 28 days. 3 mL 0   [START ON 07/22/2023] Semaglutide-Weight Management 2.4 MG/0.75ML SOAJ Inject 2.4 mg into the skin once a week for 28 days. 3 mL 0   triamcinolone (NASACORT) 55 MCG/ACT AERO nasal inhaler Place 2 sprays into the nose daily.     No current facility-administered medications for this visit.    Allergies as of 06/05/2023 - Review Complete 04/06/2023  Allergen Reaction Noted   Erythromycin Swelling 08/15/2018    Family History  Problem Relation Age of Onset   Hypertension Mother    Cancer Mother        breast cancer   Hypertension Father    Cancer Father        prostate cancer   Stroke Father    Dementia Father    Stroke Maternal Grandmother     Social History   Socioeconomic History   Marital status: Single    Spouse name: Not on file   Number of children: Not on file   Years of education: Not on file   Highest education level: GED or equivalent  Occupational History   Not on file  Tobacco Use   Smoking status: Former    Types: Cigarettes    Start date: 08/26/2022    Passive exposure: Past   Smokeless tobacco: Never  Vaping Use   Vaping status: Never Used  Substance and Sexual Activity   Alcohol use: Yes    Comment: rarely - once a year.   Drug use: Never   Sexual activity: Not Currently  Other Topics Concern   Not on file  Social History Narrative   Not on file   Social Determinants of Health   Financial Resource Strain: Medium Risk (03/28/2023)   Overall Financial Resource Strain (CARDIA)    Difficulty of Paying Living Expenses: Somewhat hard  Food Insecurity: Food Insecurity Present (03/28/2023)   Hunger Vital Sign    Worried About  Running Out of Food in the Last Year: Sometimes true    Ran Out of Food in the Last Year: Never true  Transportation Needs: No Transportation Needs (03/28/2023)   PRAPARE - Administrator, Civil Service (Medical): No    Lack of Transportation (Non-Medical): No  Physical Activity: Insufficiently Active (03/28/2023)   Exercise Vital Sign    Days of Exercise per Week:  3 days    Minutes of Exercise per Session: 30 min  Stress: No Stress Concern Present (03/28/2023)   [employer] of Occupational Health - Occupational Stress Questionnaire    Feeling of Stress : Only a little  Social Connections: Moderately Isolated (03/28/2023)   Social Connection and Isolation Panel [NHANES]    Frequency of Communication with Friends and Family: More than three times a week    Frequency of Social Gatherings with Friends and Family: Three times a week    Attends Religious Services: More than 4 times per year    Active Member of Clubs or Organizations: No    Attends Engineer, structural: Not on file    Marital Status: Divorced    Review of systems General: negative for malaise, night sweats, fever, chills, weight loss Neck: Negative for lumps, goiter, pain and significant neck swelling Resp: Negative for cough, wheezing, dyspnea at rest CV: Negative for chest pain, leg swelling, palpitations, orthopnea GI: denies melena, hematochezia, nausea, vomiting, constipation, dysphagia, odyonophagia, early satiety or unintentional weight loss. +diarrhea +rectal bleeding +lower abdominal cramping MSK: Negative for joint pain or swelling, back pain, and muscle pain. Derm: Negative for itching or rash Psych: Denies depression, anxiety, memory loss, confusion. No homicidal or suicidal ideation.  Heme: Negative for prolonged bleeding, bruising easily, and swollen nodes. Endocrine: Negative for cold or heat intolerance, polyuria, polydipsia and goiter. Neuro: negative for tremor, gait imbalance,  syncope and seizures. The remainder of the review of systems is noncontributory.  Physical Exam: BP (!) 155/101 (BP Location: Left Arm, Patient Position: Sitting, Cuff Size: Large)   Pulse 84   Temp 98.7 F (37.1 C) (Oral)   Ht 5' 5.75" (1.67 m)   Wt 271 lb 4.8 oz (123.1 kg)   BMI 44.12 kg/m  General:   Alert and oriented. No distress noted. Pleasant and cooperative.  Head:  Normocephalic and atraumatic. Eyes:  Conjuctiva clear without scleral icterus. Mouth:  Oral mucosa pink and moist. Good dentition. No lesions. Heart: Normal rate and rhythm, s1 and s2 heart sounds present.  Lungs: Clear lung sounds in all lobes. Respirations equal and unlabored. Abdomen:  +BS, soft, non-tender and non-distended. No rebound or guarding. No HSM or masses noted. Derm: No palmar erythema or jaundice Msk:  Symmetrical without gross deformities. Normal posture. Extremities:  Without edema. Neurologic:  Alert and  oriented x4 Psych:  Alert and cooperative. Normal mood and affect.  Invalid input(s): "6 MONTHS"   ASSESSMENT: Niya Arnoux is a 62 y.o. female presenting today as a new patient for IBS-D and to schedule colonoscopy  IBS-D: reported 20 year history chronic/intermittent diarrhea with a few days every 1-2 months of diarrhea and some lower abdominal cramping. She takes imodium PRN which helps some. She notes she does not care to avoid trigger foods. I did offer her a trial of bentyl to which she declined. Will provide low FODMAP food guide and instructed her on good stress management as well. She can let me know if she wishes to try antispasmodic. Patient did inquire about FMLA papers for her IBS, however, I advised her she would need to discuss further with her PCP as we do not typically do FMLA for IBS, especially given her intermittent symptoms.   Colon polyps: last colonoscopy in 2017 with polyps, per patient. This was done in [place] and no records available currently for review, however,  patient notes she was advised to have colonoscopy every 5 years. She has some occasional  rectal bleeding, though also notes presence of hemorrhoids. Recommend scheduling colonoscopy at this time. Indications, risks and benefits of procedure discussed in detail with patient. Patient verbalized understanding and is in agreement to proceed with Colonoscopy.    PLAN:  Schedule colonoscopy-ASA III  2.  Continue imodium PRN  3. Consider bentyl 10mg  BID PRN, pt to make me aware if she wishes to try  4. Low FODMAP food guide, good stress management 5. Obtain records from previous GI in [place]  All questions were answered, patient verbalized understanding and is in agreement with plan as outlined above.   Follow Up: 6 months   Chelsea L. Jeanmarie Hubert, MSN, APRN, AGNP-C Adult-Gerontology Nurse Practitioner [facility]

## 2023-06-05 NOTE — Telephone Encounter (Signed)
PA for TCS approved via [employer]. Approved from 06/05/23-09/05/23

## 2023-06-05 NOTE — Addendum Note (Signed)
Addended by: Marlowe Shores on: 06/05/2023 10:30 AM   Modules accepted: Orders

## 2023-06-14 ENCOUNTER — Encounter (INDEPENDENT_AMBULATORY_CARE_PROVIDER_SITE_OTHER): Payer: Self-pay

## 2023-06-19 ENCOUNTER — Encounter: Payer: Self-pay | Admitting: Family Medicine

## 2023-06-19 ENCOUNTER — Ambulatory Visit (INDEPENDENT_AMBULATORY_CARE_PROVIDER_SITE_OTHER): Payer: No Typology Code available for payment source | Admitting: Family Medicine

## 2023-06-19 ENCOUNTER — Encounter: Payer: No Typology Code available for payment source | Admitting: Physical Therapy

## 2023-06-19 DIAGNOSIS — I10 Essential (primary) hypertension: Secondary | ICD-10-CM

## 2023-06-19 DIAGNOSIS — Z7689 Persons encountering health services in other specified circumstances: Secondary | ICD-10-CM

## 2023-06-19 LAB — BAYER DCA HB A1C WAIVED: HB A1C (BAYER DCA - WAIVED): 5.6 % (ref 4.8–5.6)

## 2023-06-19 MED ORDER — ZEPBOUND 5 MG/0.5ML ~~LOC~~ SOAJ
5.0000 mg | SUBCUTANEOUS | 0 refills | Status: AC
Start: 2023-06-19 — End: 2023-07-11

## 2023-06-19 MED ORDER — ZEPBOUND 7.5 MG/0.5ML ~~LOC~~ SOAJ
7.5000 mg | SUBCUTANEOUS | 0 refills | Status: AC
Start: 2023-06-19 — End: 2023-07-11

## 2023-06-19 NOTE — Progress Notes (Signed)
Subjective:  Patient ID: Margaret Schmitt, female    DOB: 1961/02/04, 62 y.o.   MRN: [id number]  Patient Care Team: Sonny Masters, FNP as PCP - General (Family Medicine) Wyline Mood Dorothe Pea, MD as PCP - Cardiology (Cardiology)   Chief Complaint:  BMI (Would like to switch to zepbound )   HPI: Margaret Schmitt is a 62 y.o. female presenting on 06/19/2023 for BMI (Would like to switch to zepbound )   1. Morbid obesity (HCC) 2. Encounter for weight management Pt presents today for weight management. She has been on Wegovy but has been unable to get the medications over the last 2 weeks. She has done ok on the medications, down 3lbs since last visit. She just has not been able to get medications filled consistently due to shortage. No adverse side effects.   3. Essential hypertension States her blood pressure is well controlled at home. Denies headaches, chest pain, leg swelling, weakness, confusion, or syncope. No visual changes.      Relevant past medical, surgical, family, and social history reviewed and updated as indicated.  Allergies and medications reviewed and updated. Data reviewed: Chart in Epic.   Past Medical History:  Diagnosis Date   Asthma    Eczema    History of diverticulitis    Hypertension    Hypertension    Hypothyroidism    IBS (irritable bowel syndrome)    diagnose when living in [place]   Morbid obesity (HCC)    OSA (obstructive sleep apnea)    Thyroid cancer (HCC)    Thyroid disease    hypothyroidism    Past Surgical History:  Procedure Laterality Date   ABDOMINAL HYSTERECTOMY     ADENOIDECTOMY     APPENDECTOMY     CHOLECYSTECTOMY     COLONOSCOPY     around 2016. removed polyps.pt thinks she  was to repeat in 5 years. tcs done in [place].   THYROID LOBECTOMY Left    TONSILLECTOMY     URETERAL REIMPLANTION Left    VAGINAL PROLAPSE REPAIR     used cadaver tissue    Social History   Socioeconomic History   Marital status: Single    Spouse  name: Not on file   Number of children: Not on file   Years of education: Not on file   Highest education level: GED or equivalent  Occupational History   Not on file  Tobacco Use   Smoking status: Former    Types: Cigarettes    Start date: 08/26/2022    Passive exposure: Past   Smokeless tobacco: Never  Vaping Use   Vaping status: Never Used  Substance and Sexual Activity   Alcohol use: Yes    Comment: rarely - once a year.   Drug use: Never   Sexual activity: Not Currently  Other Topics Concern   Not on file  Social History Narrative   Not on file   Social Determinants of Health   Financial Resource Strain: Medium Risk (03/28/2023)   Overall Financial Resource Strain (CARDIA)    Difficulty of Paying Living Expenses: Somewhat hard  Food Insecurity: Food Insecurity Present (03/28/2023)   Hunger Vital Sign    Worried About Running Out of Food in the Last Year: Sometimes true    Ran Out of Food in the Last Year: Never true  Transportation Needs: No Transportation Needs (03/28/2023)   PRAPARE - Transportation    Lack of Transportation (Medical): No    Lack of  Transportation (Non-Medical): No  Physical Activity: Insufficiently Active (03/28/2023)   Exercise Vital Sign    Days of Exercise per Week: 3 days    Minutes of Exercise per Session: 30 min  Stress: No Stress Concern Present (03/28/2023)   [employer] of Occupational Health - Occupational Stress Questionnaire    Feeling of Stress : Only a little  Social Connections: Moderately Isolated (03/28/2023)   Social Connection and Isolation Panel [NHANES]    Frequency of Communication with Friends and Family: More than three times a week    Frequency of Social Gatherings with Friends and Family: Three times a week    Attends Religious Services: More than 4 times per year    Active Member of Clubs or Organizations: No    Attends Banker Meetings: Not on file    Marital Status: Divorced  Intimate Partner  Violence: Not At Risk (05/08/2022)   Received from [facility], [facility]   Humiliation, Afraid, Rape, and Kick questionnaire    Fear of Current or Ex-Partner: No    Emotionally Abused: No    Physically Abused: No    Sexually Abused: No    Outpatient Encounter Medications as of 06/19/2023  Medication Sig   albuterol (VENTOLIN HFA) 108 (90 Base) MCG/ACT inhaler Inhale 2 puffs into the lungs every 6 (six) hours as needed for wheezing or shortness of breath.   KLOR-CON M20 20 MEQ tablet TAKE 1 TABLET (20 MEQ TOTAL) BY MOUTH AS NEEDED. ONLY WHEN TAKING LASIX 20 MG.   levothyroxine (EUTHYROX) 200 MCG tablet Take 1 tablet (200 mcg total) by mouth daily before breakfast.   lisinopril (ZESTRIL) 20 MG tablet Take 1 tablet (20 mg total) by mouth 2 (two) times daily at 10 AM and 5 PM.   Medium Chain Triglycerides (MCT OIL PO) Take 2,000 mg by mouth daily.   Sod Picosulfate-Mag Ox-Cit Acd (CLENPIQ) 10-3.5-12 MG-GM -GM/175ML SOLN Take 1 kit by mouth as directed.   tirzepatide (ZEPBOUND) 5 MG/0.5ML Pen Inject 5 mg into the skin once a week for 4 doses.   tirzepatide (ZEPBOUND) 7.5 MG/0.5ML Pen Inject 7.5 mg into the skin once a week for 4 doses.   triamcinolone (NASACORT) 55 MCG/ACT AERO nasal inhaler Place 2 sprays into the nose daily.   furosemide (LASIX) 20 MG tablet TAKE 1 TABLET (20 MG TOTAL) BY MOUTH AS NEEDED (FOR SWELLING).   [DISCONTINUED] Semaglutide-Weight Management 1 MG/0.5ML SOAJ Inject 1 mg into the skin once a week for 28 days. (Patient not taking: Reported on 06/19/2023)   [DISCONTINUED] Semaglutide-Weight Management 1.7 MG/0.75ML SOAJ Inject 1.7 mg into the skin once a week for 28 days. (Patient not taking: Reported on 06/19/2023)   [DISCONTINUED] Semaglutide-Weight Management 2.4 MG/0.75ML SOAJ Inject 2.4 mg into the skin once a week for 28 days. (Patient not taking: Reported on 06/19/2023)   No facility-administered encounter medications on file as of 06/19/2023.    Allergies   Allergen Reactions   Erythromycin Swelling    Review of Systems  Constitutional:  Negative for activity change, appetite change, chills, diaphoresis, fatigue, fever and unexpected weight change.  HENT: Negative.    Eyes: Negative.  Negative for photophobia and visual disturbance.  Respiratory:  Negative for cough, chest tightness and shortness of breath.   Cardiovascular:  Negative for chest pain, palpitations and leg swelling.  Gastrointestinal:  Negative for abdominal pain, blood in stool, constipation, diarrhea, nausea and vomiting.  Endocrine: Negative.   Genitourinary:  Negative for decreased urine  volume, difficulty urinating, dysuria, frequency and urgency.  Musculoskeletal:  Negative for arthralgias and myalgias.  Skin: Negative.   Allergic/Immunologic: Negative.   Neurological:  Negative for dizziness, tremors, seizures, syncope, facial asymmetry, speech difficulty, weakness, light-headedness, numbness and headaches.  Hematological: Negative.   Psychiatric/Behavioral:  Negative for confusion, hallucinations, sleep disturbance and suicidal ideas.   All other systems reviewed and are negative.       Objective:  BP (!) 176/96   Pulse 85   Temp (!) 96.4 F (35.8 C) (Temporal)   Ht 5' 5.75" (1.67 m)   Wt 271 lb 3.2 oz (123 kg)   SpO2 96%   BMI 44.11 kg/m    Wt Readings from Last 3 Encounters:  06/19/23 271 lb 3.2 oz (123 kg)  06/05/23 271 lb 4.8 oz (123.1 kg)  04/06/23 274 lb (124.3 kg)    Physical Exam Vitals and nursing note reviewed.  Constitutional:      General: She is not in acute distress.    Appearance: Normal appearance. She is morbidly obese. She is not ill-appearing, toxic-appearing or diaphoretic.  HENT:     Head: Normocephalic and atraumatic.     Nose: Nose normal.     Mouth/Throat:     Mouth: Mucous membranes are moist.  Eyes:     Conjunctiva/sclera: Conjunctivae normal.     Pupils: Pupils are equal, round, and reactive to light.  Neck:      Thyroid: No thyroid mass, thyromegaly or thyroid tenderness.  Cardiovascular:     Rate and Rhythm: Normal rate and regular rhythm.     Heart sounds: Normal heart sounds.  Pulmonary:     Effort: Pulmonary effort is normal.     Breath sounds: Normal breath sounds.  Musculoskeletal:     Cervical back: Neck supple.     Right lower leg: No edema.     Left lower leg: No edema.  Skin:    General: Skin is warm and dry.     Capillary Refill: Capillary refill takes less than 2 seconds.  Neurological:     General: No focal deficit present.     Mental Status: She is alert and oriented to person, place, and time.  Psychiatric:        Mood and Affect: Mood normal.        Behavior: Behavior normal.        Thought Content: Thought content normal.        Judgment: Judgment normal.     Results for orders placed or performed in visit on 03/28/23  HIV antibody (with reflex)  Result Value Ref Range   HIV Screen 4th Generation wRfx Non Reactive Non Reactive  Hepatitis C Antibody  Result Value Ref Range   Hep C Virus Ab Non Reactive Non Reactive  CBC with Differential/Platelet  Result Value Ref Range   WBC 9.8 3.4 - 10.8 x10E3/uL   RBC 5.17 3.77 - 5.28 x10E6/uL   Hemoglobin 13.5 11.1 - 15.9 g/dL   Hematocrit 78.2 95.6 - 46.6 %   MCV 80 79 - 97 fL   MCH 26.1 (L) 26.6 - 33.0 pg   MCHC 32.7 31.5 - 35.7 g/dL   RDW 21.3 08.6 - 57.8 %   Platelets 310 150 - 450 x10E3/uL   Neutrophils 69 Not Estab. %   Lymphs 19 Not Estab. %   Monocytes 7 Not Estab. %   Eos 4 Not Estab. %   Basos 1 Not Estab. %   Neutrophils Absolute 6.7 1.4 -  7.0 x10E3/uL   Lymphocytes Absolute 1.9 0.7 - 3.1 x10E3/uL   Monocytes Absolute 0.7 0.1 - 0.9 x10E3/uL   EOS (ABSOLUTE) 0.4 0.0 - 0.4 x10E3/uL   Basophils Absolute 0.1 0.0 - 0.2 x10E3/uL   Immature Granulocytes 0 Not Estab. %   Immature Grans (Abs) 0.0 0.0 - 0.1 x10E3/uL  CMP14+EGFR  Result Value Ref Range   Glucose 100 (H) 70 - 99 mg/dL   BUN 15 8 - 27 mg/dL    Creatinine, Ser 1.61 0.57 - 1.00 mg/dL   eGFR 68 >09 UE/AVW/0.98   BUN/Creatinine Ratio 16 12 - 28   Sodium 141 134 - 144 mmol/L   Potassium 4.6 3.5 - 5.2 mmol/L   Chloride 104 96 - 106 mmol/L   CO2 21 20 - 29 mmol/L   Calcium 9.5 8.7 - 10.3 mg/dL   Total Protein 6.9 6.0 - 8.5 g/dL   Albumin 4.4 3.8 - 4.9 g/dL   Globulin, Total 2.5 1.5 - 4.5 g/dL   Albumin/Globulin Ratio 1.8 1.2 - 2.2   Bilirubin Total 0.2 0.0 - 1.2 mg/dL   Alkaline Phosphatase 90 44 - 121 IU/L   AST 16 0 - 40 IU/L   ALT 12 0 - 32 IU/L  Bayer DCA Hb A1c Waived  Result Value Ref Range   HB A1C (BAYER DCA - WAIVED) 6.0 (H) 4.8 - 5.6 %  Thyroid Panel With TSH  Result Value Ref Range   TSH 1.740 0.450 - 4.500 uIU/mL   T4, Total 9.3 4.5 - 12.0 ug/dL   T3 Uptake Ratio 26 24 - 39 %   Free Thyroxine Index 2.4 1.2 - 4.9  Lipid panel  Result Value Ref Range   Cholesterol, Total 205 (H) 100 - 199 mg/dL   Triglycerides 98 0 - 149 mg/dL   HDL 63 >11 mg/dL   VLDL Cholesterol Cal 17 5 - 40 mg/dL   LDL Chol Calc (NIH) 914 (H) 0 - 99 mg/dL   Chol/HDL Ratio 3.3 0.0 - 4.4 ratio  Hepatitis B surface antibody,qualitative  Result Value Ref Range   Hep B Surface Ab, Qual Non Reactive   Urine cytology ancillary only  Result Value Ref Range   Neisseria Gonorrhea Negative    Chlamydia Negative    Trichomonas Negative    Bacterial Vaginitis-Urine Negative    Candida Urine Negative    Molecular Comment      For tests bacteria and/or candida, this specimen does not meet the   Molecular Comment      strict criteria set by the FDA. The result interpretation should be   Molecular Comment      considered in conjunction with the patient's clinical history.   Comment Normal Reference Range Trichomonas - Negative    Comment Normal Reference Ranger Chlamydia - Negative    Comment      Normal Reference Range Neisseria Gonorrhea - Negative       Pertinent labs & imaging results that were available during my care of the patient were  reviewed by me and considered in my medical decision making.  Assessment & Plan:  Margaret Schmitt was seen today for bmi.  Diagnoses and all orders for this visit:  Morbid obesity (HCC) Encounter for weight management Has been unable to get [facility]. Will send in Zepbound for continued weight management. Diet and exercise is a must along with adequate water intake. Labs pending.  -     tirzepatide (ZEPBOUND) 5 MG/0.5ML Pen; Inject 5 mg into the skin once  a week for 4 doses. -     tirzepatide (ZEPBOUND) 7.5 MG/0.5ML Pen; Inject 7.5 mg into the skin once a week for 4 doses. -     BMP8+EGFR -     CBC with Differential/Platelet -     Thyroid Panel With TSH -     Bayer DCA Hb A1c Waived  Essential hypertension Not controlled today. Reports control at home. Aware to monitor at home and report persistent high readings. If still elevated at upcoming visit, will add Norvasc. DASH diet and exercise encouraged.  -     BMP8+EGFR -     CBC with Differential/Platelet -     Thyroid Panel With TSH     Continue all other maintenance medications.  Follow up plan: Return in 6 weeks (on 07/31/2023), or if symptoms worsen or fail to improve, for weight management .   Continue healthy lifestyle choices, including diet (rich in fruits, vegetables, and lean proteins, and low in salt and simple carbohydrates) and exercise (at least 30 minutes of moderate physical activity daily).  Educational handout given for calorie counting for weight management. GLP1 success  The above assessment and management plan was discussed with the patient. The patient verbalized understanding of and has agreed to the management plan. Patient is aware to call the clinic if they develop any new symptoms or if symptoms persist or worsen. Patient is aware when to return to the clinic for a follow-up visit. Patient educated on when it is appropriate to go to the emergency department.   Kari Baars, FNP-C [facility] [phone]

## 2023-06-19 NOTE — Patient Instructions (Signed)

## 2023-06-20 ENCOUNTER — Encounter (HOSPITAL_COMMUNITY)
Admission: RE | Admit: 2023-06-20 | Discharge: 2023-06-20 | Disposition: A | Discharge status: Home / Self Care | Payer: No Typology Code available for payment source | Source: Ambulatory Visit | Attending: Gastroenterology | Admitting: Gastroenterology

## 2023-06-20 ENCOUNTER — Other Ambulatory Visit (HOSPITAL_COMMUNITY): Payer: Self-pay

## 2023-06-20 ENCOUNTER — Encounter (HOSPITAL_COMMUNITY): Payer: Self-pay

## 2023-06-20 ENCOUNTER — Other Ambulatory Visit: Payer: Self-pay

## 2023-06-20 ENCOUNTER — Telehealth: Payer: Self-pay

## 2023-06-20 LAB — BMP8+EGFR
BUN/Creatinine Ratio: 16 (ref 12–28)
BUN: 15 mg/dL (ref 8–27)
CO2: 21 mmol/L (ref 20–29)
Calcium: 9.2 mg/dL (ref 8.7–10.3)
Chloride: 106 mmol/L (ref 96–106)
Creatinine, Ser: 0.91 mg/dL (ref 0.57–1.00)
Glucose: 89 mg/dL (ref 70–99)
Potassium: 4.6 mmol/L (ref 3.5–5.2)
Sodium: 141 mmol/L (ref 134–144)
eGFR: 72 mL/min/{1.73_m2} (ref >59)

## 2023-06-20 LAB — CBC WITH DIFFERENTIAL/PLATELET
Basophils Absolute: 0.2 10*3/uL (ref 0.0–0.2)
Basos: 2 %
EOS (ABSOLUTE): 0.5 10*3/uL — ABNORMAL HIGH (ref 0.0–0.4)
Eos: 5 %
Hematocrit: 40.5 % (ref 34.0–46.6)
Hemoglobin: 13.2 g/dL (ref 11.1–15.9)
Immature Grans (Abs): 0 10*3/uL (ref 0.0–0.1)
Immature Granulocytes: 0 %
Lymphocytes Absolute: 2.7 10*3/uL (ref 0.7–3.1)
Lymphs: 30 %
MCH: 26.6 pg (ref 26.6–33.0)
MCHC: 32.6 g/dL (ref 31.5–35.7)
MCV: 82 fL (ref 79–97)
Monocytes Absolute: 0.7 10*3/uL (ref 0.1–0.9)
Monocytes: 8 %
Neutrophils Absolute: 4.9 10*3/uL (ref 1.4–7.0)
Neutrophils: 55 %
Platelets: 310 10*3/uL (ref 150–450)
RBC: 4.97 x10E6/uL (ref 3.77–5.28)
RDW: 15.1 % (ref 11.7–15.4)
WBC: 8.9 10*3/uL (ref 3.4–10.8)

## 2023-06-20 LAB — THYROID PANEL WITH TSH
Free Thyroxine Index: 2.3 (ref 1.2–4.9)
T3 Uptake Ratio: 27 % (ref 24–39)
T4, Total: 8.5 ug/dL (ref 4.5–12.0)
TSH: 2.57 u[IU]/mL (ref 0.450–4.500)

## 2023-06-20 NOTE — Telephone Encounter (Signed)
PA request has been Submitted. New Encounter created for follow up. For additional info see Pharmacy Prior Auth telephone encounter from 08/21.

## 2023-06-20 NOTE — Telephone Encounter (Signed)
*  Primary  Pharmacy Patient Advocate Encounter   Received notification from Patient Advice Request messages that prior authorization for Zepbound 5MG /0.5ML pen-injectors  is required/requested.   Insurance verification completed.   The patient is insured through [facility] [employer] .   Per test claim: PA required; PA submitted to [facility] via CoverMyMeds Key/confirmation #/EOC [id number] Status is pending

## 2023-06-22 ENCOUNTER — Ambulatory Visit (HOSPITAL_COMMUNITY): Payer: No Typology Code available for payment source | Admitting: Certified Registered Nurse Anesthetist

## 2023-06-22 ENCOUNTER — Ambulatory Visit (HOSPITAL_BASED_OUTPATIENT_CLINIC_OR_DEPARTMENT_OTHER): Payer: No Typology Code available for payment source | Admitting: Certified Registered Nurse Anesthetist

## 2023-06-22 ENCOUNTER — Ambulatory Visit (HOSPITAL_COMMUNITY)
Admission: RE | Admit: 2023-06-22 | Discharge: 2023-06-22 | Disposition: A | Discharge status: Home / Self Care | Payer: No Typology Code available for payment source | Attending: Gastroenterology | Admitting: Gastroenterology

## 2023-06-22 ENCOUNTER — Encounter (HOSPITAL_COMMUNITY)
Admission: RE | Disposition: A | Discharge status: Home / Self Care | Payer: Self-pay | Source: Home / Self Care | Attending: Gastroenterology

## 2023-06-22 ENCOUNTER — Encounter (HOSPITAL_COMMUNITY): Payer: Self-pay

## 2023-06-22 DIAGNOSIS — D122 Benign neoplasm of ascending colon: Secondary | ICD-10-CM | POA: Insufficient documentation

## 2023-06-22 DIAGNOSIS — D125 Benign neoplasm of sigmoid colon: Secondary | ICD-10-CM | POA: Diagnosis not present

## 2023-06-22 DIAGNOSIS — E039 Hypothyroidism, unspecified: Secondary | ICD-10-CM | POA: Insufficient documentation

## 2023-06-22 DIAGNOSIS — K635 Polyp of colon: Secondary | ICD-10-CM | POA: Diagnosis not present

## 2023-06-22 DIAGNOSIS — D124 Benign neoplasm of descending colon: Secondary | ICD-10-CM | POA: Insufficient documentation

## 2023-06-22 DIAGNOSIS — G4733 Obstructive sleep apnea (adult) (pediatric): Secondary | ICD-10-CM | POA: Diagnosis not present

## 2023-06-22 DIAGNOSIS — Z87891 Personal history of nicotine dependence: Secondary | ICD-10-CM

## 2023-06-22 DIAGNOSIS — I10 Essential (primary) hypertension: Secondary | ICD-10-CM | POA: Diagnosis not present

## 2023-06-22 DIAGNOSIS — K5730 Diverticulosis of large intestine without perforation or abscess without bleeding: Secondary | ICD-10-CM | POA: Diagnosis not present

## 2023-06-22 DIAGNOSIS — Z1211 Encounter for screening for malignant neoplasm of colon: Secondary | ICD-10-CM | POA: Insufficient documentation

## 2023-06-22 DIAGNOSIS — K648 Other hemorrhoids: Secondary | ICD-10-CM | POA: Insufficient documentation

## 2023-06-22 DIAGNOSIS — D128 Benign neoplasm of rectum: Secondary | ICD-10-CM | POA: Diagnosis not present

## 2023-06-22 DIAGNOSIS — Z9049 Acquired absence of other specified parts of digestive tract: Secondary | ICD-10-CM | POA: Insufficient documentation

## 2023-06-22 DIAGNOSIS — D123 Benign neoplasm of transverse colon: Secondary | ICD-10-CM | POA: Insufficient documentation

## 2023-06-22 DIAGNOSIS — K644 Residual hemorrhoidal skin tags: Secondary | ICD-10-CM | POA: Diagnosis not present

## 2023-06-22 DIAGNOSIS — K580 Irritable bowel syndrome with diarrhea: Secondary | ICD-10-CM | POA: Insufficient documentation

## 2023-06-22 DIAGNOSIS — D126 Benign neoplasm of colon, unspecified: Secondary | ICD-10-CM

## 2023-06-22 HISTORY — PX: SUBMUCOSAL LIFTING INJECTION: SHX6855

## 2023-06-22 HISTORY — PX: POLYPECTOMY: SHX5525

## 2023-06-22 HISTORY — PX: BIOPSY: SHX5522

## 2023-06-22 HISTORY — PX: COLONOSCOPY WITH PROPOFOL: SHX5780

## 2023-06-22 LAB — HM COLONOSCOPY

## 2023-06-22 LAB — GLUCOSE, CAPILLARY: Glucose-Capillary: 89 mg/dL (ref 70–99)

## 2023-06-22 SURGERY — COLONOSCOPY WITH PROPOFOL
Anesthesia: General

## 2023-06-22 MED ORDER — PROPOFOL 500 MG/50ML IV EMUL
INTRAVENOUS | Status: AC
  Filled 2023-06-22: qty 50

## 2023-06-22 MED ORDER — PROPOFOL 500 MG/50ML IV EMUL
INTRAVENOUS | Status: DC | PRN
  Administered 2023-06-22: 150 ug/kg/min via INTRAVENOUS

## 2023-06-22 MED ORDER — LACTATED RINGERS IV SOLN: INTRAVENOUS | Status: DC | PRN

## 2023-06-22 MED ORDER — PROPOFOL 10 MG/ML IV BOLUS
INTRAVENOUS | Status: DC | PRN
Start: 2023-06-22 — End: 2023-06-22
  Administered 2023-06-22: 80 mg via INTRAVENOUS

## 2023-06-22 NOTE — Transfer of Care (Signed)
Immediate Anesthesia Transfer of Care Note  Patient: Bennie Lego  Procedure(s) Performed: COLONOSCOPY WITH PROPOFOL POLYPECTOMY BIOPSY  Patient Location: Short Stay  Anesthesia Type:General  Level of Consciousness: awake, alert , and oriented  Airway & Oxygen Therapy: Patient Spontanous Breathing  Post-op Assessment: Report given to RN, Post -op Vital signs reviewed and stable, Patient moving all extremities X 4, and Patient able to stick tongue midline  Post vital signs: Reviewed and stable  Last Vitals:  Vitals Value Taken Time  BP 162/97   Temp 97.9   Pulse 87   Resp 15   SpO2 96     Last Pain:  Vitals:   06/22/23 0826  TempSrc: Oral  PainSc: 0-No pain      Patients Stated Pain Goal: 8 (06/22/23 0826)  Complications: No notable events documented.

## 2023-06-22 NOTE — Anesthesia Preprocedure Evaluation (Signed)
Anesthesia Evaluation  Patient identified by MRN, date of birth, ID band Patient awake    Reviewed: Allergy & Precautions, H&P , NPO status , Patient's Chart, lab work & pertinent test results, reviewed documented beta blocker date and time   Airway Mallampati: II  TM Distance: >3 FB Neck ROM: full    Dental no notable dental hx.    Pulmonary asthma , sleep apnea , former smoker   Pulmonary exam normal breath sounds clear to auscultation       Cardiovascular Exercise Tolerance: Good hypertension,  Rhythm:regular Rate:Normal     Neuro/Psych  PSYCHIATRIC DISORDERS Anxiety Depression    negative neurological ROS     GI/Hepatic negative GI ROS, Neg liver ROS,,,  Endo/Other  Hypothyroidism  Morbid obesity  Renal/GU negative Renal ROS  negative genitourinary   Musculoskeletal   Abdominal   Peds  Hematology negative hematology ROS (+)   Anesthesia Other Findings   Reproductive/Obstetrics negative OB ROS                             Anesthesia Physical Anesthesia Plan  ASA: 3  Anesthesia Plan: General   Post-op Pain Management:    Induction:   PONV Risk Score and Plan: Propofol infusion  Airway Management Planned:   Additional Equipment:   Intra-op Plan:   Post-operative Plan:   Informed Consent: I have reviewed the patients History and Physical, chart, labs and discussed the procedure including the risks, benefits and alternatives for the proposed anesthesia with the patient or authorized representative who has indicated his/her understanding and acceptance.     Dental Advisory Given  Plan Discussed with: CRNA  Anesthesia Plan Comments:        Anesthesia Quick Evaluation

## 2023-06-22 NOTE — Discharge Instructions (Signed)

## 2023-06-22 NOTE — Op Note (Signed)
[facility] Patient Name: Margaret Schmitt Procedure Date: 06/22/2023 9:47 AM MRN: [id number] Date of Birth: Aug 13, 1961 Attending MD: Sanjuan Dame , MD, [id number] CSN: [id number] Age: 62 Admit Type: Outpatient Procedure:                Colonoscopy Indications:              High risk colon cancer surveillance: Personal                            history of colonic polyps Providers:                Sanjuan Dame, MD, Nena Polio, RN, Kristine L.                            Jessee Avers, Technician Referring MD:             Sanjuan Dame, MD Medicines:                Monitored Anesthesia Care Complications:            No immediate complications. Estimated Blood Loss:     Estimated blood loss was minimal. Procedure:                Pre-Anesthesia Assessment:                           - Prior to the procedure, a History and Physical                            was performed, and patient medications and                            allergies were reviewed. The patient's tolerance of                            previous anesthesia was also reviewed. The risks                            and benefits of the procedure and the sedation                            options and risks were discussed with the patient.                            All questions were answered, and informed consent                            was obtained. Prior Anticoagulants: The patient has                            taken no anticoagulant or antiplatelet agents. ASA                            Grade Assessment: III - A patient with severe  systemic disease. After reviewing the risks and                            benefits, the patient was deemed in satisfactory                            condition to undergo the procedure.                           After obtaining informed consent, the colonoscope                            was passed under direct vision. Throughout the                             procedure, the patient's blood pressure, pulse, and                            oxygen saturations were monitored continuously. The                            PCF-HQ190L ([id number]) scope was introduced through                            the anus and advanced to the the terminal ileum.                            The colonoscopy was performed without difficulty.                            The patient tolerated the procedure well. The                            quality of the bowel preparation was evaluated                            using the BBPS [facility] Bowel Preparation Scale)                            with scores of: Right Colon = 2 (minor amount of                            residual staining, small fragments of stool and/or                            opaque liquid, but mucosa seen well), Transverse                            Colon = 2 (minor amount of residual staining, small                            fragments of stool and/or opaque liquid, but mucosa  seen well) and Left Colon = 2 (minor amount of                            residual staining, small fragments of stool and/or                            opaque liquid, but mucosa seen well). The total                            BBPS score equals 6. The terminal ileum, ileocecal                            valve, appendiceal orifice, and rectum were                            photographed. Scope In: 10:14:03 AM Scope Out: 11:03:04 AM Scope Withdrawal Time: 0 hours 42 minutes 51 seconds  Total Procedure Duration: 0 hours 49 minutes 1 second  Findings:      The perianal and digital rectal examinations were normal.      Eight sessile polyps were found in the rectum, sigmoid colon, descending       colon, transverse colon and ascending colon. The polyps were 2 to 9 mm       in size. These polyps were removed with a cold snare. Resection and       retrieval were complete.      An 11 mm polyp was found in the  transverse colon. The polyp was sessile.       Preparations were made for mucosal resection. Demarcation of the lesion       was performed with high-definition white light and narrow band imaging       to clearly identify the boundaries of the lesion. Eleview was injected       to raise the lesion. Snare mucosal resection was performed. Resection       and retrieval were complete. Resected tissue margins were examined and       clear of polyp tissue.      Non-bleeding external and internal hemorrhoids were found during       retroflexion.      The terminal ileum appeared normal.      Scattered diverticula were found in the left colon. There was no       evidence of diverticular bleeding.      The colon (entire examined portion) appeared normal. Biopsies for       histology were taken with a cold forceps from the right colon and left       colon for evaluation of microscopic colitis. Impression:               - Eight 2 to 9 mm polyps in the rectum, in the                            sigmoid colon, in the descending colon, in the                            transverse colon and in the ascending colon,  removed with a cold snare. Resected and retrieved.                           - One 11 mm polyp in the transverse colon, removed                            with mucosal resection. Resected and retrieved.                           - Non-bleeding external and internal hemorrhoids.                           - The examined portion of the ileum was normal.                           - Diverticulosis in the left colon. There was no                            evidence of diverticular bleeding.                           - The entire examined colon is normal. Biopsied to                            r/o microscopic colitis given history of diarrhea.                           - Endoscopic Mucosal resection was performed.                            Resection and retrieval were  complete. Moderate Sedation:      Per Anesthesia Care Recommendation:           - Patient has a contact number available for                            emergencies. The signs and symptoms of potential                            delayed complications were discussed with the                            patient. Return to normal activities tomorrow.                            Written discharge instructions were provided to the                            patient.                           - Resume previous diet.                           - Continue present medications.                           -  Repeat colonoscopy in 3 years for surveillance. Procedure Code(s):        --- Professional ---                           [phone], Colonoscopy, flexible; with endoscopic                            mucosal resection                           [phone], 59, Colonoscopy, flexible; with removal of                            tumor(s), polyp(s), or other lesion(s) by snare                            technique Diagnosis Code(s):        --- Professional ---                           Z86.010, Personal history of colonic polyps                           D12.8, Benign neoplasm of rectum                           D12.5, Benign neoplasm of sigmoid colon                           D12.4, Benign neoplasm of descending colon                           D12.3, Benign neoplasm of transverse colon (hepatic                            flexure or splenic flexure)                           D12.2, Benign neoplasm of ascending colon                           K64.8, Other hemorrhoids CPT copyright 2022 American Medical Association. All rights reserved. The codes documented in this report are preliminary and upon coder review may  be revised to meet current compliance requirements. Sanjuan Dame, MD Sanjuan Dame, MD 06/22/2023 11:12:49 AM This report has been signed electronically. Number of Addenda: 0

## 2023-06-22 NOTE — Interval H&P Note (Signed)
History and Physical Interval Note:  06/22/2023 8:50 AM  Margaret Schmitt  has presented today for surgery, with the diagnosis of hx polyps; rectal bleeding.  The various methods of treatment have been discussed with the patient and family. After consideration of risks, benefits and other options for treatment, the patient has consented to  Procedure(s) with comments: COLONOSCOPY WITH PROPOFOL (N/A) - 1:15pm;asa 3 as a surgical intervention.  The patient's history has been reviewed, patient examined, no change in status, stable for surgery.  I have reviewed the patient's chart and labs.  Questions were answered to the patient's satisfaction.     Juanetta Beets [name]

## 2023-06-23 NOTE — Anesthesia Postprocedure Evaluation (Signed)
Anesthesia Post Note  Patient: Margaret Schmitt  Procedure(s) Performed: COLONOSCOPY WITH PROPOFOL POLYPECTOMY BIOPSY SUBMUCOSAL LIFTING INJECTION  Patient location during evaluation: Phase II Anesthesia Type: General Level of consciousness: awake Pain management: pain level controlled Vital Signs Assessment: post-procedure vital signs reviewed and stable Respiratory status: spontaneous breathing and respiratory function stable Cardiovascular status: blood pressure returned to baseline and stable Postop Assessment: no headache and no apparent nausea or vomiting Anesthetic complications: no Comments: Late entry   No notable events documented.   Last Vitals:  Vitals:   06/22/23 1113 06/22/23 1129  BP: (!) 162/97 (!) 143/89  Pulse: 84 74  Resp: 18   Temp: 36.6 C   SpO2: 98% 98%    Last Pain:  Vitals:   06/22/23 1129  TempSrc:   PainSc: 0-No pain                 Windell Norfolk

## 2023-06-25 ENCOUNTER — Encounter (INDEPENDENT_AMBULATORY_CARE_PROVIDER_SITE_OTHER): Payer: Self-pay | Admitting: *Deleted

## 2023-06-25 LAB — SURGICAL PATHOLOGY

## 2023-06-25 NOTE — Progress Notes (Signed)
I reviewed the pathology results. Ann, can you send her a letter with the findings as described below please? Repeat colonoscopy in 3 years  Thanks,  Vista Lawman, MD Gastroenterology and Hepatology [facility]  ---------------------------------------------------------------------------------------------  [facility] [address], [place], [place] [address] Phone:  [phone]   06/25/23 [place], [place]   Dear Margaret Schmitt,  I am writing to inform you that the biopsies taken during your recent endoscopic examination showed:  Sessile serrated polyp. I am writing to let you know the results of your recent colonoscopy.  You had a total of 9 polyps removed. The pathology came back as "sessile serrated" polyp. The biggest one was 11 mm. These findings are NOT cancer, but had the polyps remained in your colon, they could have turn into cancer.  Given these findings, it is recommended that your next colonoscopy be performed in 3 years.  Please call us at [phone] if you have persistent problems or have questions about your condition that have not been fully answered at this time.  Sincerely,  Vista Lawman, MD Gastroenterology and Hepatology

## 2023-06-26 ENCOUNTER — Encounter (HOSPITAL_COMMUNITY): Payer: Self-pay | Admitting: Gastroenterology

## 2023-06-28 ENCOUNTER — Encounter: Payer: No Typology Code available for payment source | Admitting: Physical Therapy

## 2023-06-29 NOTE — Telephone Encounter (Signed)
Pharmacy Patient Advocate Encounter  Received notification from [facility] that Prior Authorization for Zepbound has been DENIED.  See denial reason below. No denial letter attached in CMM. Will attache denial letter to Media tab once received.   PA #/Case ID/Reference #: The requested drug is not covered by the plan. Please contact member services for further assistance.

## 2023-07-05 ENCOUNTER — Encounter: Payer: No Typology Code available for payment source | Admitting: Physical Therapy

## 2023-07-11 ENCOUNTER — Encounter: Payer: No Typology Code available for payment source | Admitting: Physical Therapy

## 2023-07-27 ENCOUNTER — Encounter: Payer: No Typology Code available for payment source | Admitting: Family Medicine

## 2023-08-08 ENCOUNTER — Ambulatory Visit: Payer: No Typology Code available for payment source | Attending: Cardiology | Admitting: Cardiology

## 2023-08-08 ENCOUNTER — Encounter: Payer: Self-pay | Admitting: Cardiology

## 2023-08-08 VITALS — BP 162/102 | HR 78 | Ht 67.0 in | Wt 281.4 lb

## 2023-08-08 DIAGNOSIS — I10 Essential (primary) hypertension: Secondary | ICD-10-CM | POA: Diagnosis not present

## 2023-08-08 DIAGNOSIS — R002 Palpitations: Secondary | ICD-10-CM

## 2023-08-08 MED ORDER — METOPROLOL TARTRATE 25 MG PO TABS: 12.5000 mg | ORAL_TABLET | Freq: Two times a day (BID) | ORAL | 6 refills | Status: DC

## 2023-08-08 NOTE — Patient Instructions (Signed)
Medication Instructions:   Begin Lopressor 12.5mg  twice a day, may take an additional 12.5 as needed for palpitations Continue all other medications.     Labwork:  none  Testing/Procedures:  none  Follow-Up:  Your physician wants you to follow up in:  1 year.  You should receive a recall letter in the mail about 2 months prior to the time you are due.  If you don't receive this, please call our office to schedule your follow up appointment.      Any Other Special Instructions Will Be Listed Below (If Applicable).   If you need a refill on your cardiac medications before your next appointment, please call your pharmacy.

## 2023-08-08 NOTE — Progress Notes (Signed)
Clinical Summary Margaret Schmitt is a 62 y.o.female seen today for follow up of the following medical problems.      Tachycardia ER visit with tachycardia - computer interpretation of EKG was afib, on my review looks more like MAT - K was low 3.3, AKI - sudden onset of fluttering in chest. Has had before but usually short in duration. This episode was prolonged, +weakness. Mild SOB.  - 1-2 coffees per day, 1-2 sodas but has cut back, no EtOH   - reports prior workup for palpitations showed only PVCs over 10 years ago.  - occasional palpitations at times.    - 1-2 times per week, just a few seconds.    -occasional palpitations, few times a week. Often when feeling anxious. Just for a few seconds - working to limit caffeine but still has some intake.  - 05/2023 normal TSH, K 4.6     2. HTN - history of significant white coat HTN -have compared home cuff with ours and it is accurate.    Home bps 120-130s/70s-80s -she is compliant with meds - home cuff similar to ours in past.    3. Chest pain - ER visit 05/2022, EKG NSR no ischemic changes. CXR no acute process - ddimer neg, trop neg x 2. EKG     05/2022 echo LVEF 70-75%, no WMAs 06/2022 GXT: no ischemic changes, exercised 6 minutes      4. 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[employer], monitors inpatient telemetry Past Medical History:  Diagnosis Date   Asthma    Eczema    History of diverticulitis    Hypertension    Hypertension    Hypothyroidism    IBS (irritable bowel syndrome)    diagnose when living in [place]   Morbid obesity (HCC)    OSA (obstructive sleep apnea)    Thyroid cancer (HCC)    Thyroid disease    hypothyroidism     Allergies  Allergen Reactions   Erythromycin Swelling     Current Outpatient Medications  Medication Sig Dispense Refill   albuterol (VENTOLIN HFA) 108 (90 Base) MCG/ACT inhaler Inhale 2 puffs into the lungs every 6 (six) hours as needed for wheezing or  shortness of breath. 18 g 2   furosemide (LASIX) 20 MG tablet TAKE 1 TABLET (20 MG TOTAL) BY MOUTH AS NEEDED (FOR SWELLING). 90 tablet 1   KLOR-CON M20 20 MEQ tablet TAKE 1 TABLET (20 MEQ TOTAL) BY MOUTH AS NEEDED. ONLY WHEN TAKING LASIX 20 MG. 90 tablet 1   levothyroxine (EUTHYROX) 200 MCG tablet Take 1 tablet (200 mcg total) by mouth daily before breakfast. 90 tablet 3   lisinopril (ZESTRIL) 20 MG tablet Take 1 tablet (20 mg total) by mouth 2 (two) times daily at 10 AM and 5 PM. 180 tablet 1   Medium Chain Triglycerides (MCT OIL PO) Take 2,000 mg by mouth daily.     Sod Picosulfate-Mag Ox-Cit Acd (CLENPIQ) 10-3.5-12 MG-GM -GM/175ML SOLN Take 1 kit by mouth as directed. 350 mL 0   triamcinolone (NASACORT) 55 MCG/ACT AERO nasal inhaler Place 2 sprays into the nose daily.     No current facility-administered medications for this visit.     Past Surgical History:  Procedure Laterality Date   ABDOMINAL HYSTERECTOMY     ADENOIDECTOMY     APPENDECTOMY     BIOPSY  06/22/2023   Procedure: BIOPSY;  Surgeon: Franky Macho, MD;  Location: [facility];  Service: Endoscopy;;   CHOLECYSTECTOMY     COLONOSCOPY     around 2016. removed polyps.pt thinks she  was to repeat in 5 years. tcs done in [place].   COLONOSCOPY WITH PROPOFOL N/A 06/22/2023   Procedure: COLONOSCOPY WITH PROPOFOL;  Surgeon: Franky Macho, MD;  Location: [facility];  Service: Endoscopy;  Laterality: N/A;  1:15pm;asa 3   POLYPECTOMY  06/22/2023   Procedure: POLYPECTOMY;  Surgeon: Franky Macho, MD;  Location: [facility];  Service: Endoscopy;;   SUBMUCOSAL LIFTING INJECTION  06/22/2023   Procedure: SUBMUCOSAL LIFTING INJECTION;  Surgeon: Franky Macho, MD;  Location: [facility];  Service: Endoscopy;;   THYROID LOBECTOMY Left    TONSILLECTOMY     URETERAL REIMPLANTION Left    VAGINAL PROLAPSE REPAIR     used cadaver tissue     Allergies  Allergen Reactions   Erythromycin Swelling      Family  History  Problem Relation Age of Onset   Hypertension Mother    Cancer Mother        breast cancer   Hypertension Father    Cancer Father        prostate cancer   Stroke Father    Dementia Father    Stroke Maternal Grandmother      Social History Ms. Fujimoto reports that she has quit smoking. Her smoking use included cigarettes. She started smoking about a year ago. She has been exposed to tobacco smoke. She has never used smokeless tobacco. Ms. Witthuhn reports current alcohol use.   Review of Systems CONSTITUTIONAL: No weight loss, fever, chills, weakness or fatigue.  HEENT: Eyes: No visual loss, blurred vision, double vision or yellow sclerae.No hearing loss, sneezing, congestion, runny nose or sore throat.  SKIN: No rash or itching.  CARDIOVASCULAR: per hpi RESPIRATORY: No shortness of breath, cough or sputum.  GASTROINTESTINAL: No anorexia, nausea, vomiting or diarrhea. No abdominal pain or blood.  GENITOURINARY: No burning on urination, no polyuria NEUROLOGICAL: No headache, dizziness, syncope, paralysis, ataxia, numbness or tingling in the extremities. No change in bowel or bladder control.  MUSCULOSKELETAL: No muscle, back pain, joint pain or stiffness.  LYMPHATICS: No enlarged nodes. No history of splenectomy.  PSYCHIATRIC: No history of depression or anxiety.  ENDOCRINOLOGIC: No reports of sweating, cold or heat intolerance. No polyuria or polydipsia.  Marland Kitchen   Physical Examination Today's Vitals   08/08/23 0808 08/08/23 0818  BP: (!) 158/98 (!) 162/102  Pulse: 78   SpO2: 99%   Weight: 281 lb 6.4 oz (127.6 kg)   Height: 5\' 7"  (1.702 m)    Body mass index is 44.07 kg/m.  Gen: resting comfortably, no acute distress HEENT: no scleral icterus, pupils equal round and reactive, no palptable cervical adenopathy,  CV: RRR, no mrg, no jvd Resp: Clear to auscultation bilaterally GI: abdomen is soft, non-tender, non-distended, normal bowel sounds, no hepatosplenomegaly MSK:  extremities are warm, no edema.  Skin: warm, no rash Neuro:  no focal deficits Psych: appropriate affect   Diagnostic Studies  05/2022 echo IMPRESSIONS     1. Left ventricular ejection fraction, by estimation, is 70 to 75%. The  left ventricle has hyperdynamic function. The left ventricle has no  regional wall motion abnormalities. Left ventricular diastolic parameters  were normal. The average left  ventricular global longitudinal strain is -25.5 %. The global longitudinal  strain is normal.   2. Right ventricular systolic function is normal. The right ventricular  size is normal.  Tricuspid regurgitation signal is inadequate for assessing  PA pressure.   3. The mitral valve is normal in structure. Trivial mitral valve  regurgitation. No evidence of mitral stenosis.   4. The aortic valve is tricuspid. Aortic valve regurgitation is not  visualized. No aortic stenosis is present.   5. The inferior vena cava is normal in size with greater than 50%  respiratory variability, suggesting right atrial pressure of 3 mmHg.          Assessment and Plan     1. HTN - labile bp's, we spread her lisinopril dose to 20mg  bid to see if smooths numbers our during the day.  - avoiding diuretic due to chronic issues with low K - significant history of white coat HTN, her home bp's remain at goal. - continue current meds   2. Palpitations - prior ER visit with palpitations, found to be in MAT in setting of hypokalemia -some palpitations at times, start lopressor 12.5mg  bid, can take additional 12.5mg  prn  - EKG today shows NSR   F/u 1 year    Antoine Poche, M.D.

## 2023-08-21 ENCOUNTER — Encounter: Payer: Self-pay | Admitting: Family Medicine

## 2023-08-22 ENCOUNTER — Encounter (INDEPENDENT_AMBULATORY_CARE_PROVIDER_SITE_OTHER): Payer: Self-pay

## 2023-08-22 NOTE — Telephone Encounter (Signed)
Chelsea, are these papers that you have that I need to forward?

## 2023-09-04 ENCOUNTER — Encounter: Payer: Self-pay | Admitting: Family Medicine

## 2023-09-17 ENCOUNTER — Encounter: Payer: Self-pay | Admitting: Family Medicine

## 2023-12-04 ENCOUNTER — Encounter: Payer: Self-pay | Admitting: Family Medicine

## 2023-12-04 ENCOUNTER — Ambulatory Visit (INDEPENDENT_AMBULATORY_CARE_PROVIDER_SITE_OTHER): Payer: BC Managed Care – PPO | Admitting: Family Medicine

## 2023-12-04 VITALS — BP 166/103 | HR 80 | Temp 97.7°F | Ht 67.0 in | Wt 288.2 lb

## 2023-12-04 DIAGNOSIS — G8929 Other chronic pain: Secondary | ICD-10-CM

## 2023-12-04 DIAGNOSIS — F339 Major depressive disorder, recurrent, unspecified: Secondary | ICD-10-CM

## 2023-12-04 DIAGNOSIS — E890 Postprocedural hypothyroidism: Secondary | ICD-10-CM | POA: Diagnosis not present

## 2023-12-04 DIAGNOSIS — Z85850 Personal history of malignant neoplasm of thyroid: Secondary | ICD-10-CM | POA: Diagnosis not present

## 2023-12-04 DIAGNOSIS — Z0001 Encounter for general adult medical examination with abnormal findings: Secondary | ICD-10-CM

## 2023-12-04 DIAGNOSIS — G4733 Obstructive sleep apnea (adult) (pediatric): Secondary | ICD-10-CM

## 2023-12-04 DIAGNOSIS — Z1231 Encounter for screening mammogram for malignant neoplasm of breast: Secondary | ICD-10-CM | POA: Diagnosis not present

## 2023-12-04 DIAGNOSIS — F411 Generalized anxiety disorder: Secondary | ICD-10-CM

## 2023-12-04 DIAGNOSIS — M25561 Pain in right knee: Secondary | ICD-10-CM

## 2023-12-04 DIAGNOSIS — I10 Essential (primary) hypertension: Secondary | ICD-10-CM

## 2023-12-04 DIAGNOSIS — E559 Vitamin D deficiency, unspecified: Secondary | ICD-10-CM

## 2023-12-04 DIAGNOSIS — E782 Mixed hyperlipidemia: Secondary | ICD-10-CM

## 2023-12-04 DIAGNOSIS — Z0000 Encounter for general adult medical examination without abnormal findings: Secondary | ICD-10-CM

## 2023-12-04 LAB — LIPID PANEL

## 2023-12-04 LAB — BAYER DCA HB A1C WAIVED: HB A1C (BAYER DCA - WAIVED): 5.8 % — ABNORMAL HIGH (ref 4.8–5.6)

## 2023-12-04 MED ORDER — LEVOTHYROXINE SODIUM 200 MCG PO TABS
200.0000 ug | ORAL_TABLET | Freq: Every day | ORAL | 3 refills | Status: DC
Start: 2023-12-04 — End: 2024-06-02

## 2023-12-04 NOTE — Progress Notes (Signed)
 Complete physical exam  Patient: Margaret Schmitt   DOB: 1961-02-18   63 y.o. Female  MRN: [id number]  Subjective:    Chief Complaint  Patient presents with   Annual Exam   Knee Pain    Having pain behind right knee, hurt knee few years ago and it has been bothering her for couple months now. Used to come and go but is now all the time    Margaret Schmitt is a 63 y.o. female who presents today for a complete physical exam. She reports consuming a general diet. The patient does not participate in regular exercise at present. She generally feels well. She reports sleeping fairly well. She does have additional problems to discuss today.  Discussed the use of AI scribe software for clinical note transcription with the patient, who gave verbal consent to proceed.  History of Present Illness   Margaret Schmitt is a 63 year old female with hypertension and hypothyroidism who presents for a routine physical exam and follow-up on chronic conditions.  She takes lisinopril  twice daily for hypertension but experiences a persistent cough, which she recalls having in the past with the same medication. Despite this, she continues the medication. No headaches, chest pain, or shortness of breath are present. She notes occasional leg swelling after prolonged sitting at work, which resolves with rest.  She has a history of hypothyroidism and takes levothyroxine , though she occasionally forgets doses, especially after night shifts. She has not refilled her prescription recently due to a pharmacy switch and confusion over medication readiness. She feels tired for about a month and is interested in checking her vitamin D  levels, as they have been low in the past. She does not take any vitamin supplements.  She uses a CPAP machine for sleep apnea but has not had it checked since receiving it. She has noticed increased daytime sleepiness, including falling asleep at work, which was not an issue initially with CPAP  use. She has not been evaluated for narcolepsy despite previous suggestions.  She reports a history of a Baker's cyst behind her knee, which has been causing increased pain over the past few weeks. She recalls injuring the knee a few years ago, leading to the cyst's development. The knee sometimes locks up and pops, and she has not seen an orthopedic specialist for this issue.  She has a family history of breast cancer, as her mother had the condition, and she undergoes regular mammograms. She recently had a mammogram in October, which required a redo due to suspicious findings, though she has experienced similar situations in the past. She mentions a family history of dementia, as her father had the condition, and expresses concern about her own memory issues, which she attributes to potential thyroid  or vitamin deficiencies.  She works night shifts, which affects her medication adherence and sleep patterns.       Most recent fall risk assessment:    06/19/2023    8:37 AM  Fall Risk   Falls in the past year? 1  Number falls in past yr: 1  Injury with Fall? 0  Follow up Falls prevention discussed     Most recent depression screenings:    06/19/2023    8:37 AM 04/06/2023   11:00 AM  PHQ 2/9 Scores  PHQ - 2 Score 2 2  PHQ- 9 Score 3 4    Vision:Within last year and Dental: No current dental problems and Receives regular dental care  Patient Active Problem  List   Diagnosis Date Noted   Adenomatous polyp of colon 06/22/2023   Rectal bleeding 06/05/2023   History of colonic polyps 06/05/2023   IBS (irritable bowel syndrome) 04/06/2023   Depression, recurrent (HCC) 08/04/2022   GAD (generalized anxiety disorder) 08/04/2022   History of hysterectomy 04/05/2022   History of thyroid  cancer 01/03/2022   RLS (restless legs syndrome) 08/13/2020   Circadian rhythm sleep disorder, 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   Hyperlipidemia 07/20/2020   Eczema    OSA (obstructive sleep apnea)     Essential hypertension 08/22/2018   Hypothyroidism 08/22/2018   Morbid obesity (HCC) 08/22/2018   Past Medical History:  Diagnosis Date   Asthma    Eczema    History of diverticulitis    Hypertension    Hypertension    Hypothyroidism    IBS (irritable bowel syndrome)    diagnose when living in [place]   Morbid obesity (HCC)    OSA (obstructive sleep apnea)    Thyroid  cancer (HCC)    Thyroid  disease    hypothyroidism   Past Surgical History:  Procedure Laterality Date   ABDOMINAL HYSTERECTOMY     ADENOIDECTOMY     APPENDECTOMY     BIOPSY  06/22/2023   Procedure: BIOPSY;  Surgeon: Cinderella Deatrice FALCON, MD;  [facility]: [facility];  Service: Endoscopy;;   CHOLECYSTECTOMY     COLONOSCOPY     around 2016. removed polyps.pt thinks she  was to repeat in 5 years. tcs done in [place] .   COLONOSCOPY WITH PROPOFOL  N/A 06/22/2023   Procedure: COLONOSCOPY WITH PROPOFOL ;  Surgeon: Cinderella Deatrice FALCON, MD;  Location: [facility];  Service: Endoscopy;  Laterality: N/A;  1:15pm;asa 3   POLYPECTOMY  06/22/2023   Procedure: POLYPECTOMY;  Surgeon: Cinderella Deatrice FALCON, MD;  Location: [facility];  Service: Endoscopy;;   SUBMUCOSAL LIFTING INJECTION  06/22/2023   Procedure: SUBMUCOSAL LIFTING INJECTION;  Surgeon: Cinderella Deatrice FALCON, MD;  Location: [facility];  Service: Endoscopy;;   THYROID  LOBECTOMY Left    TONSILLECTOMY     URETERAL REIMPLANTION Left    VAGINAL PROLAPSE REPAIR     used cadaver tissue   Social History   Tobacco Use   Smoking status: Former    Types: Cigarettes    Start date: 08/26/2022    Passive exposure: Past   Smokeless tobacco: Never  Vaping Use   Vaping status: Never Used  Substance Use Topics   Alcohol use: Yes    Comment: rarely - once a year.   Drug use: Never   Social History   Socioeconomic History   Marital status: Single    Spouse name: Not on file   Number of children: Not on file   Years of education: Not on file   Highest education level: GED or  equivalent  Occupational History   Not on file  Tobacco Use   Smoking status: Former    Types: Cigarettes    Start date: 08/26/2022    Passive exposure: Past   Smokeless tobacco: Never  Vaping Use   Vaping status: Never Used  Substance and Sexual Activity   Alcohol use: Yes    Comment: rarely - once a year.   Drug use: Never   Sexual activity: Not Currently  Other Topics Concern   Not on file  Social History Narrative   Not on file   Social Drivers of Health   Financial Resource Strain: Medium Risk (12/04/2023)   Overall Financial Resource Strain (CARDIA)  Difficulty of Paying Living Expenses: Somewhat hard  Food Insecurity: Food Insecurity Present (12/04/2023)   Hunger Vital Sign    Worried About Running Out of Food in the Last Year: Sometimes true    Ran Out of Food in the Last Year: Never true  Transportation Needs: No Transportation Needs (12/04/2023)   PRAPARE - Administrator, Civil Service (Medical): No    Lack of Transportation (Non-Medical): No  Physical Activity: Inactive (12/04/2023)   Exercise Vital Sign    Days of Exercise per Week: 0 days    Minutes of Exercise per Session: 30 min  Stress: Stress Concern Present (12/04/2023)   [employer] of Occupational Health - Occupational Stress Questionnaire    Feeling of Stress : To some extent  Social Connections: Moderately Isolated (12/04/2023)   Social Connection and Isolation Panel [NHANES]    Frequency of Communication with Friends and Family: More than three times a week    Frequency of Social Gatherings with Friends and Family: Three times a week    Attends Religious Services: More than 4 times per year    Active Member of Clubs or Organizations: No    Attends Banker Meetings: Not on file    Marital Status: Divorced  Intimate Partner Violence: Not At Risk (05/08/2022)   [facility]   Humiliation, Afraid, Rape, and Kick questionnaire    Fear of  Current or Ex-Partner: No    Emotionally Abused: No    Physically Abused: No    Sexually Abused: No   Family Status  Relation Name Status   Mother  Alive   Father  Deceased   MGM  Deceased   MGF  Deceased   PGM  Deceased   PGF  Deceased  No partnership data on file   Family History  Problem Relation Age of Onset   Hypertension Mother    Cancer Mother        breast cancer   Hypertension Father    Cancer Father        prostate cancer   Stroke Father    Dementia Father    Stroke Maternal Grandmother    Allergies  Allergen Reactions   Erythromycin Swelling      Patient Care Team: Hurshel Bouillon, Rock HERO, FNP as PCP - General (Family Medicine) Alvan Dorn FALCON, MD as PCP - Cardiology (Cardiology)   Outpatient Medications Prior to Visit  Medication Sig   albuterol  (VENTOLIN  HFA) 108 (90 Base) MCG/ACT inhaler Inhale 2 puffs into the lungs every 6 (six) hours as needed for wheezing or shortness of breath.   KLOR-CON  M20 20 MEQ tablet TAKE 1 TABLET (20 MEQ TOTAL) BY MOUTH AS NEEDED. ONLY WHEN TAKING LASIX  20 MG.   lisinopril  (ZESTRIL ) 20 MG tablet Take 1 tablet (20 mg total) by mouth 2 (two) times daily at 10 AM and 5 PM.   Medium Chain Triglycerides (MCT OIL PO) Take 2,000 mg by mouth daily.   metoprolol  tartrate (LOPRESSOR ) 25 MG tablet Take 0.5 tablets (12.5 mg total) by mouth 2 (two) times daily. (May take an additional 12.5 as needed for palpitations)   triamcinolone  (NASACORT ) 55 MCG/ACT AERO nasal inhaler Place 2 sprays into the nose daily.   [DISCONTINUED] levothyroxine  (EUTHYROX ) 200 MCG tablet Take 1 tablet (200 mcg total) by mouth daily before breakfast.   furosemide  (LASIX ) 20 MG tablet TAKE 1 TABLET (20 MG TOTAL) BY MOUTH AS NEEDED (FOR SWELLING).   No facility-administered  medications prior to visit.   ROS per HPI       Objective:     BP (!) 166/103   Pulse 80   Temp 97.7 F (36.5 C) (Temporal)   Ht 5' 7 (1.702 m)   Wt 288 lb 3.2 oz (130.7 kg)   SpO2 97%    BMI 45.14 kg/m  BP Readings from Last 3 Encounters:  12/04/23 (!) 166/103  08/08/23 (!) 162/102  06/22/23 (!) 143/89   Wt Readings from Last 3 Encounters:  12/04/23 288 lb 3.2 oz (130.7 kg)  08/08/23 281 lb 6.4 oz (127.6 kg)  06/19/23 271 lb 3.2 oz (123 kg)   SpO2 Readings from Last 3 Encounters:  12/04/23 97%  08/08/23 99%  06/22/23 98%      Physical Exam Vitals and nursing note reviewed.  Constitutional:      General: She is not in acute distress.    Appearance: Normal appearance. She is well-developed and well-groomed. She is morbidly obese. She is not ill-appearing, toxic-appearing or diaphoretic.  HENT:     Head: Normocephalic and atraumatic.     Jaw: There is normal jaw occlusion.     Right Ear: Hearing, tympanic membrane, ear canal and external ear normal.     Left Ear: Hearing, tympanic membrane, ear canal and external ear normal.     Nose: Nose normal.     Mouth/Throat:     Lips: Pink.     Mouth: Mucous membranes are moist.     Pharynx: Oropharynx is clear. Uvula midline.  Eyes:     General: Lids are normal.     Extraocular Movements: Extraocular movements intact.     Conjunctiva/sclera: Conjunctivae normal.     Pupils: Pupils are equal, round, and reactive to light.  Neck:     Thyroid : No thyroid  mass, thyromegaly or thyroid  tenderness.     Vascular: No carotid bruit or JVD.     Trachea: Trachea and phonation normal.  Cardiovascular:     Rate and Rhythm: Normal rate and regular rhythm.     Chest Wall: PMI is not displaced.     Pulses: Normal pulses.     Heart sounds: Normal heart sounds. No murmur heard.    No friction rub. No gallop.  Pulmonary:     Effort: Pulmonary effort is normal. No respiratory distress.     Breath sounds: Normal breath sounds. No wheezing.  Abdominal:     General: Bowel sounds are normal. There is no distension or abdominal bruit.     Palpations: Abdomen is soft. There is no hepatomegaly or splenomegaly.     Tenderness: There  is no abdominal tenderness. There is no right CVA tenderness or left CVA tenderness.     Hernia: No hernia is present.  Musculoskeletal:        General: Normal range of motion.     Cervical back: Normal range of motion and neck supple.     Right upper leg: Normal.     Right knee: Swelling (popliteal space) present. No deformity, effusion, erythema, ecchymosis or lacerations. Normal range of motion. Tenderness present.     Right lower leg: Normal. No edema.     Left lower leg: No edema.  Lymphadenopathy:     Cervical: No cervical adenopathy.  Skin:    General: Skin is warm and dry.     Capillary Refill: Capillary refill takes less than 2 seconds.     Coloration: Skin is not cyanotic, jaundiced or pale.  Findings: No rash.  Neurological:     General: No focal deficit present.     Mental Status: She is alert and oriented to person, place, and time.     Sensory: Sensation is intact.     Motor: Motor function is intact.     Coordination: Coordination is intact.     Gait: Gait is intact.     Deep Tendon Reflexes: Reflexes are normal and symmetric.  Psychiatric:        Attention and Perception: Attention and perception normal.        Mood and Affect: Mood and affect normal.        Speech: Speech normal.        Behavior: Behavior normal. Behavior is cooperative.        Thought Content: Thought content normal.        Cognition and Memory: Cognition and memory normal.        Judgment: Judgment normal.      No results found for any visits on 12/04/23. Last CBC Lab Results  Component Value Date   WBC 8.9 06/19/2023   HGB 13.2 06/19/2023   HCT 40.5 06/19/2023   MCV 82 06/19/2023   MCH 26.6 06/19/2023   RDW 15.1 06/19/2023   PLT 310 06/19/2023   Last metabolic panel Lab Results  Component Value Date   GLUCOSE 89 06/19/2023   NA 141 06/19/2023   K 4.6 06/19/2023   CL 106 06/19/2023   CO2 21 06/19/2023   BUN 15 06/19/2023   CREATININE 0.91 06/19/2023   EGFR 72 06/19/2023    CALCIUM 9.2 06/19/2023   PROT 6.9 03/28/2023   ALBUMIN 4.4 03/28/2023   LABGLOB 2.5 03/28/2023   AGRATIO 1.8 03/28/2023   BILITOT 0.2 03/28/2023   ALKPHOS 90 03/28/2023   AST 16 03/28/2023   ALT 12 03/28/2023   ANIONGAP 7 06/20/2022   Last lipids Lab Results  Component Value Date   CHOL 205 (H) 03/28/2023   HDL 63 03/28/2023   LDLCALC 125 (H) 03/28/2023   TRIG 98 03/28/2023   CHOLHDL 3.3 03/28/2023   Last hemoglobin A1c Lab Results  Component Value Date   HGBA1C 5.6 06/19/2023   Last thyroid  functions Lab Results  Component Value Date   TSH 2.570 06/19/2023   T4TOTAL 8.5 06/19/2023    Assessment & Plan:    Routine Health Maintenance and Physical Exam  Immunization History  Administered Date(s) Administered   PPD Test 02/13/2018, 05/03/2018    Health Maintenance  Topic Date Due   COVID-19 Vaccine (1) 12/20/2023 (Originally 06/25/1966)   INFLUENZA VACCINE  01/28/2024 (Originally 05/31/2023)   Zoster Vaccines- Shingrix (1 of 2) 03/02/2024 (Originally 06/24/1981)   DTaP/Tdap/Td (1 - Tdap) 12/03/2024 (Originally 06/24/1980)   MAMMOGRAM  08/22/2025   Colonoscopy  06/21/2026   Hepatitis C Screening  Completed   HIV Screening  Completed   HPV VACCINES  Aged Out    Discussed health benefits of physical activity, and encouraged her to engage in regular exercise appropriate for her age and condition.  Problem List Items Addressed This Visit       Cardiovascular and Mediastinum   Essential hypertension   Relevant Orders   Anemia Profile B   CMP14+EGFR   Lipid panel     Respiratory   OSA (obstructive sleep apnea)     Endocrine   Hypothyroidism   Relevant Medications   levothyroxine  (EUTHYROX ) 200 MCG tablet   Other Relevant Orders   Thyroid  Panel With TSH  Other   Morbid obesity (HCC)   Relevant Orders   Anemia Profile B   CMP14+EGFR   Lipid panel   Thyroid  Panel With TSH   VITAMIN D  25 Hydroxy (Vit-D Deficiency, Fractures)   Bayer DCA Hb A1c  Waived   Hyperlipidemia   Relevant Orders   CMP14+EGFR   Lipid panel   History of thyroid  cancer   Relevant Medications   levothyroxine  (EUTHYROX ) 200 MCG tablet   Other Relevant Orders   Thyroid  Panel With TSH   Depression, recurrent (HCC)   Relevant Orders   Thyroid  Panel With TSH   GAD (generalized anxiety disorder)   Relevant Orders   Thyroid  Panel With TSH   Other Visit Diagnoses       Annual physical exam    -  Primary   Relevant Orders   MM 3D SCREENING MAMMOGRAM BILATERAL BREAST   Anemia Profile B   CMP14+EGFR   Lipid panel   Thyroid  Panel With TSH   VITAMIN D  25 Hydroxy (Vit-D Deficiency, Fractures)   Bayer DCA Hb A1c Waived     Encounter for screening mammogram for malignant neoplasm of breast       Relevant Orders   MM 3D SCREENING MAMMOGRAM BILATERAL BREAST     Vitamin D  deficiency       Relevant Orders   CMP14+EGFR   VITAMIN D  25 Hydroxy (Vit-D Deficiency, Fractures)     Chronic pain of right knee       Relevant Orders   Ambulatory referral to Orthopedic Surgery     Assessment and Plan    Hypertension Chronic hypertension managed with lisinopril . Reports persistent cough, likely a side effect of lisinopril . No other symptoms such as headaches, chest pain, or dyspnea. Discussed risks and benefits of switching to losartan , including potential side effects and improved cough relief. - Continue lisinopril  - Consider switching to losartan  if cough persists - Follow up with Dr. Alvan for medication adjustment  Hypothyroidism Chronic hypothyroidism managed with levothyroxine . Reports occasional missed doses and fatigue for about a month. No significant changes in hair, skin, or nails. Discussed importance of adherence to medication regimen to manage symptoms effectively. - Continue levothyroxine  - Check thyroid  function tests - Encourage adherence to medication regimen  Sleep Apnea Obstructive sleep apnea managed with CPAP. Reports increased daytime  sleepiness and falling asleep at work, suggesting possible need for CPAP recalibration. Discussed potential benefits of CPAP recalibration and importance of regular follow-ups. - Contact sleep apnea clinic for CPAP recalibration - Monitor for improvement in daytime sleepiness  Baker's Cyst Pain and swelling behind the knee, likely due to a Baker's cyst. History of knee injury and occasional locking of the knee. Discussed potential benefits and risks of draining the cyst and using supportive measures for pain relief. - Refer to orthopedics for evaluation and imaging - Consider draining the cyst if indicated - Use Biofreeze or Voltaren  gel for pain relief - Use a supportive brace or Ace wrap when active  Vitamin D  Deficiency Low vitamin D  levels. Reports fatigue and non-adherence to vitamin supplements. Discussed importance of vitamin D  supplementation for overall health and energy levels. - Check vitamin D  levels - Consider vitamin D  supplementation if levels are low  General Health Maintenance Routine health maintenance discussed. Up to date on colonoscopy and mammogram. Reports vision changes and needs to follow up with eye doctor and dentist. Discussed importance of regular eye and dental check-ups. - Update blood work - Follow up with eye doctor for vision  changes - Follow up with dentist for routine care - Consider tetanus shot if cut or injury occurs  Follow-up - Reschedule GI follow-up - Follow up with Dr. Alvan for medication adjustments - Follow up with orthopedics for knee evaluation - Follow up with sleep apnea clinic for CPAP recalibration.       Return in about 6 months (around 06/02/2024), or if symptoms worsen or fail to improve, for chronic follow up .     Rosaline Bruns, FNP

## 2023-12-05 LAB — ANEMIA PROFILE B
Basophils Absolute: 0.1 10*3/uL (ref 0.0–0.2)
Basos: 2 %
EOS (ABSOLUTE): 0.5 10*3/uL — ABNORMAL HIGH (ref 0.0–0.4)
Eos: 6 %
Ferritin: 58 ng/mL (ref 15–150)
Folate: 4.3 ng/mL (ref >3.0)
Hematocrit: 41.8 % (ref 34.0–46.6)
Hemoglobin: 13.7 g/dL (ref 11.1–15.9)
Immature Grans (Abs): 0 10*3/uL (ref 0.0–0.1)
Immature Granulocytes: 0 %
Iron Saturation: 15 % (ref 15–55)
Iron: 50 ug/dL (ref 27–139)
Lymphocytes Absolute: 2.3 10*3/uL (ref 0.7–3.1)
Lymphs: 27 %
MCH: 26.7 pg (ref 26.6–33.0)
MCHC: 32.8 g/dL (ref 31.5–35.7)
MCV: 81 fL (ref 79–97)
Monocytes Absolute: 0.7 10*3/uL (ref 0.1–0.9)
Monocytes: 8 %
Neutrophils Absolute: 4.8 10*3/uL (ref 1.4–7.0)
Neutrophils: 57 %
Platelets: 286 10*3/uL (ref 150–450)
RBC: 5.14 x10E6/uL (ref 3.77–5.28)
RDW: 14.3 % (ref 11.7–15.4)
Retic Ct Pct: 1.3 % (ref 0.6–2.6)
Total Iron Binding Capacity: 331 ug/dL (ref 250–450)
UIBC: 281 ug/dL (ref 118–369)
Vitamin B-12: 414 pg/mL (ref 232–1245)
WBC: 8.4 10*3/uL (ref 3.4–10.8)

## 2023-12-05 LAB — CMP14+EGFR
ALT: 15 IU/L (ref 0–32)
AST: 17 IU/L (ref 0–40)
Albumin: 4.2 g/dL (ref 3.9–4.9)
Alkaline Phosphatase: 85 IU/L (ref 44–121)
BUN/Creatinine Ratio: 16 (ref 12–28)
BUN: 17 mg/dL (ref 8–27)
Bilirubin Total: 0.2 mg/dL (ref 0.0–1.2)
CO2: 24 mmol/L (ref 20–29)
Calcium: 9.5 mg/dL (ref 8.7–10.3)
Chloride: 106 mmol/L (ref 96–106)
Creatinine, Ser: 1.07 mg/dL — ABNORMAL HIGH (ref 0.57–1.00)
Globulin, Total: 2.6 g/dL (ref 1.5–4.5)
Glucose: 97 mg/dL (ref 70–99)
Potassium: 4.4 mmol/L (ref 3.5–5.2)
Sodium: 144 mmol/L (ref 134–144)
Total Protein: 6.8 g/dL (ref 6.0–8.5)
eGFR: 59 mL/min/{1.73_m2} — ABNORMAL LOW (ref >59)

## 2023-12-05 LAB — LIPID PANEL
Cholesterol, Total: 197 mg/dL (ref 100–199)
HDL: 55 mg/dL (ref >39)
LDL CALC COMMENT:: 3.6 ratio (ref 0.0–4.4)
LDL Chol Calc (NIH): 113 mg/dL — ABNORMAL HIGH (ref 0–99)
Triglycerides: 165 mg/dL — ABNORMAL HIGH (ref 0–149)
VLDL Cholesterol Cal: 29 mg/dL (ref 5–40)

## 2023-12-05 LAB — THYROID PANEL WITH TSH
Free Thyroxine Index: 2.4 (ref 1.2–4.9)
T3 Uptake Ratio: 26 % (ref 24–39)
T4, Total: 9.3 ug/dL (ref 4.5–12.0)
TSH: 3.65 u[IU]/mL (ref 0.450–4.500)

## 2023-12-05 LAB — VITAMIN D 25 HYDROXY (VIT D DEFICIENCY, FRACTURES): Vit D, 25-Hydroxy: 25.3 ng/mL — ABNORMAL LOW (ref 30.0–100.0)

## 2023-12-06 ENCOUNTER — Ambulatory Visit (INDEPENDENT_AMBULATORY_CARE_PROVIDER_SITE_OTHER): Payer: No Typology Code available for payment source | Admitting: Gastroenterology

## 2023-12-13 ENCOUNTER — Other Ambulatory Visit: Payer: Self-pay | Admitting: Cardiology

## 2023-12-13 DIAGNOSIS — I10 Essential (primary) hypertension: Secondary | ICD-10-CM

## 2023-12-17 ENCOUNTER — Encounter: Payer: Self-pay | Admitting: Orthopedic Surgery

## 2023-12-17 ENCOUNTER — Ambulatory Visit (INDEPENDENT_AMBULATORY_CARE_PROVIDER_SITE_OTHER): Payer: BC Managed Care – PPO | Admitting: Orthopedic Surgery

## 2023-12-17 ENCOUNTER — Other Ambulatory Visit (INDEPENDENT_AMBULATORY_CARE_PROVIDER_SITE_OTHER): Payer: BC Managed Care – PPO

## 2023-12-17 VITALS — BP 150/111 | HR 76 | Ht 67.0 in | Wt 288.0 lb

## 2023-12-17 DIAGNOSIS — M1711 Unilateral primary osteoarthritis, right knee: Secondary | ICD-10-CM | POA: Diagnosis not present

## 2023-12-17 DIAGNOSIS — G8929 Other chronic pain: Secondary | ICD-10-CM

## 2023-12-17 NOTE — Patient Instructions (Signed)
Tylenol xs 500 mg every 6 hrs

## 2023-12-17 NOTE — Progress Notes (Signed)
  Intake history:  BP (!) 150/111   Pulse 76   Ht 5\' 7"  (1.702 m)   Wt 288 lb (130.6 kg)   BMI 45.11 kg/m  Body mass index is 45.11 kg/m.    WHAT ARE WE SEEING YOU FOR TODAY?   right knee(s)  How long has this bothered you? (DOI?DOS?WS?)  2 month(s) ago  Anticoag.  No  Diabetes No  Heart disease No  Hypertension Yes  SMOKING HX No  Kidney disease No  Any ALLERGIES ______________________________________________   Treatment:  Have you taken:  Tylenol Yes  Advil No  Had PT No  Had injection No  Other  _________________________

## 2023-12-17 NOTE — Progress Notes (Signed)
 Office Visit Note   Patient: Margaret Schmitt           Date of Birth: 06-Mar-1961           MRN: [id number] Visit Date: 12/17/2023 Requested by: Sonny Masters, FNP [address] [place],  [place] [address] PCP: Sonny Masters, FNP   Assessment & Plan:   Encounter Diagnoses  Name Primary?   Chronic pain of right knee    Primary osteoarthritis of right knee Yes    No orders of the defined types were placed in this encounter.   63 year old female recommend 5% body weight loss knee exercise with Pep pad Tylenol every 6 at 500 mg dosing speak to PMD regarding NSAIDs return in 6 months   Subjective: Chief Complaint  Patient presents with   Knee Pain    Right for a couple months behind knee/ states doctor told her she may have a bakers cyst she did fall a few years ago and has felt like the knee is "not right "since     HPI: This is a 63 year old female presents to [place] for right knee pain.  She has a history of several years ago falling and felt something pop in her knee she wore a brace and it resolved  She comes in today complaining of crepitance and anterior knee pain the last 2 months with no history of trauma but with some giving way symptoms and some posterior right knee pain as well.  There was concern that she may have a Baker's cyst as well              ROS: Negative for any musculoskeletal related complaints   Images personally read and my interpretation :  DG Knee AP/LAT W/Sunrise Right Result Date: 12/17/2023 X-ray report Chief complaint imaging report right knee Right knee pain X-ray shows narrowing of the lateral compartment of both knees some narrowing of the medial compartment left knee some narrowing patellofemoral compartment right knee Impression osteoarthritis right knee primarily lateral compartment with more narrowing than seen in October 2019 Overall alignment shows valgus of the right knee slight increase     Visit Diagnoses:  1. Primary osteoarthritis of  right knee   2. Chronic pain of right knee      Follow-Up Instructions: Return in about 6 months (around 06/15/2024) for FOLLOW UP, RIGHT, KNEE - OA .    Objective: Vital Signs: BP (!) 150/111   Pulse 76   Ht 5\' 7"  (1.702 m)   Wt 288 lb (130.6 kg)   BMI 45.11 kg/m   Physical Exam Vitals and nursing note reviewed.  Constitutional:      Appearance: Normal appearance.  HENT:     Head: Normocephalic and atraumatic.  Eyes:     General: No scleral icterus.       Right eye: No discharge.        Left eye: No discharge.     Extraocular Movements: Extraocular movements intact.     Conjunctiva/sclera: Conjunctivae normal.     Pupils: Pupils are equal, round, and reactive to light.  Cardiovascular:     Rate and Rhythm: Normal rate.     Pulses: Normal pulses.  Musculoskeletal:     Right knee: No effusion.  Skin:    General: Skin is warm and dry.     Capillary Refill: Capillary refill takes less than 2 seconds.  Neurological:     General: No focal deficit present.     Mental  Status: She is alert and oriented to person, place, and time.  Psychiatric:        Mood and Affect: Mood normal.        Behavior: Behavior normal.        Thought Content: Thought content normal.        Judgment: Judgment normal.      Right Knee Exam   Tenderness  The patient is experiencing tenderness in the medial joint line.  Range of Motion  Extension:  normal  Flexion:  normal   Tests  Drawer:  Anterior - negative    Posterior - negative  Other  Erythema: present Scars: absent Sensation: normal Pulse: present Swelling: none Effusion: no effusion present       Specialty Comments:  No specialty comments available.  Imaging: DG Knee AP/LAT W/Sunrise Right Result Date: 12/17/2023 X-ray report Chief complaint imaging report right knee Right knee pain X-ray shows narrowing of the lateral compartment of both knees some narrowing of the medial compartment left knee some narrowing  patellofemoral compartment right knee Impression osteoarthritis right knee primarily lateral compartment with more narrowing than seen in October 2019 Overall alignment shows valgus of the right knee slight increase     PMFS History: Patient Active Problem List   Diagnosis Date Noted   Adenomatous polyp of colon 06/22/2023   Rectal bleeding 06/05/2023   History of colonic polyps 06/05/2023   IBS (irritable bowel syndrome) 04/06/2023   Depression, recurrent (HCC) 08/04/2022   GAD (generalized anxiety disorder) 08/04/2022   History of hysterectomy 04/05/2022   History of thyroid cancer 01/03/2022   RLS (restless legs syndrome) 08/13/2020   Circadian rhythm sleep disorder, 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   Hyperlipidemia 07/20/2020   Eczema    OSA (obstructive sleep apnea)    Essential hypertension 08/22/2018   Hypothyroidism 08/22/2018   Morbid obesity (HCC) 08/22/2018   Past Medical History:  Diagnosis Date   Asthma    Eczema    History of diverticulitis    Hypertension    Hypertension    Hypothyroidism    IBS (irritable bowel syndrome)    diagnose when living in [place]   Morbid obesity (HCC)    OSA (obstructive sleep apnea)    Thyroid cancer (HCC)    Thyroid disease    hypothyroidism    Family History  Problem Relation Age of Onset   Hypertension Mother    Cancer Mother        breast cancer   Hypertension Father    Cancer Father        prostate cancer   Stroke Father    Dementia Father    Stroke Maternal Grandmother     Past Surgical History:  Procedure Laterality Date   ABDOMINAL HYSTERECTOMY     ADENOIDECTOMY     APPENDECTOMY     BIOPSY  06/22/2023   Procedure: BIOPSY;  Surgeon: Franky Macho, MD;  [facility];  Service: Endoscopy;;   CHOLECYSTECTOMY     COLONOSCOPY     around 2016. removed polyps.pt thinks she  was to repeat in 5 years. tcs done in [place].   COLONOSCOPY WITH PROPOFOL N/A 06/22/2023   Procedure: COLONOSCOPY WITH PROPOFOL;   Surgeon: Franky Macho, MD;  [facility];  Service: Endoscopy;  Laterality: N/A;  1:15pm;asa 3   POLYPECTOMY  06/22/2023   Procedure: POLYPECTOMY;  Surgeon: Franky Macho, MD;  [facility];  Service: Endoscopy;;   SUBMUCOSAL LIFTING INJECTION  06/22/2023   Procedure: SUBMUCOSAL LIFTING INJECTION;  Surgeon: Franky Macho, MD;  [facility];  Service: Endoscopy;;   THYROID LOBECTOMY Left    TONSILLECTOMY     URETERAL REIMPLANTION Left    VAGINAL PROLAPSE REPAIR     used cadaver tissue   Social History   Occupational History   Not on file  Tobacco Use   Smoking status: Former    Types: Cigarettes    Start date: 08/26/2022    Passive exposure: Past   Smokeless tobacco: Never  Vaping Use   Vaping status: Never Used  Substance and Sexual Activity   Alcohol use: Yes    Comment: rarely - once a year.   Drug use: Never   Sexual activity: Not Currently

## 2024-01-08 ENCOUNTER — Ambulatory Visit (INDEPENDENT_AMBULATORY_CARE_PROVIDER_SITE_OTHER): Payer: Medicaid Other | Admitting: Gastroenterology

## 2024-01-08 ENCOUNTER — Encounter (INDEPENDENT_AMBULATORY_CARE_PROVIDER_SITE_OTHER): Payer: Self-pay | Admitting: Gastroenterology

## 2024-01-08 VITALS — BP 164/90 | HR 70 | Temp 97.7°F | Ht 67.0 in | Wt 292.0 lb

## 2024-01-08 DIAGNOSIS — K580 Irritable bowel syndrome with diarrhea: Secondary | ICD-10-CM

## 2024-01-08 NOTE — Progress Notes (Signed)
 Referring Provider: Sonny Masters, FNP Primary Care Physician:  Sonny Masters, FNP Primary GI Physician: Dr. Tasia Catchings   Chief Complaint  Patient presents with   Follow-up    Follow up. No problems    HPI:   Tiaira Arambula is a 63 y.o. female with past medical history of asthma, eczema, diverticulitis, HTN, Hypothyroidism, IBS, OSA, thyroid cancer   Patient presenting today for:  IBS-D  Last seen August 2024, at that time, pt reported history of chronic/intermittent diarrhea for about 20 years. Having a BM 2-3x/day. Having diarrhea maybe 1-2 days per month with fecal urgency. Occaisonaly having mucus in stools and some LLQ pain at times. Takes imodium PRN. Occasional toilet tissue hematochezia.   Recommended to schedule colonoscopy, continue imodium PRN, consider bentyl 10mg  BID PRN, low FODMAP good guide, obtain records from GI in [place].  Present:  Doing well today. She reports no changes in her GI symptoms. Having a BM about twice per day, taking imodium PRN still. Has diarrhea maybe a few times per month. She has occasional abdominal cramping prior to defecation. Denies melena. Has some toilet tissue hematochezia from an external hemorrhoid. She has used otc hemorrhoid cream In the past but reports no real issues with them. She does have to strain at times despite having softer stools. She reports that she sometimes feels she does not empty out well and has to push on her belly to have a BM.   She has seen pelvic floor physical therapy in the past for pelvic floor issues and note she only went a few times as she was unable to continue to go as often as they recommended.   No red flag symptoms. Patient denies melena, nausea, vomiting, dysphagia, odyonophagia, early satiety or weight loss.    Last Colonoscopy:05/2023- Eight 2 to 9 mm polyps in the rectum, in the                            sigmoid colon, in the descending colon, in the                            transverse colon and  in the ascending colon,                            removed with a cold snare. Resected and retrieved.                           - One 11 mm polyp in the transverse colon, removed                            with mucosal resection. Resected and retrieved.                           - Non-bleeding external and internal hemorrhoids.                           - The examined portion of the ileum was normal.                           - Diverticulosis in the left colon. There was no  evidence of diverticular bleeding.                           - The entire examined colon is normal. Biopsied to                            r/o microscopic colitis given history of diarrhea.                           - Endoscopic Mucosal resection was performed.                            Resection and retrieval were complete. A. COLON, DESCENDING, SIGMOID, TRANSVERSE, ASCENDING, RECTUM,  POLYPECTOMY:      Fragments of sessile serrated polyp without cytologic dysplasia.       Fragments of hyperplastic polyp.       Negative for dysplasia.   B. COLON, RANDOM, BIOPSY:       Colonic mucosa with focal hyperplastic changes.       Negative for activity, chronicity, granuloma, dysplasia or  malignancy.      Negative for lymphocytic colitis or collagenous colitis.   Last Endoscopy: never   Recommended to repeat in 3 years   Filed Weights   01/08/24 0820  Weight: 292 lb (132.5 kg)   Past Medical History:  Diagnosis Date   Asthma    Eczema    History of diverticulitis    Hypertension    Hypertension    Hypothyroidism    IBS (irritable bowel syndrome)    diagnose when living in [place]   Morbid obesity (HCC)    OSA (obstructive sleep apnea)    Thyroid cancer (HCC)    Thyroid disease    hypothyroidism    Past Surgical History:  Procedure Laterality Date   ABDOMINAL HYSTERECTOMY     ADENOIDECTOMY     APPENDECTOMY     BIOPSY  06/22/2023   Procedure: BIOPSY;  Surgeon: Franky Macho,  MD;  [facility];  Service: Endoscopy;;   CHOLECYSTECTOMY     COLONOSCOPY     around 2016. removed polyps.pt thinks she  was to repeat in 5 years. tcs done in [place].   COLONOSCOPY WITH PROPOFOL N/A 06/22/2023   Procedure: COLONOSCOPY WITH PROPOFOL;  Surgeon: Franky Macho, MD;  [facility];  Service: Endoscopy;  Laterality: N/A;  1:15pm;asa 3   POLYPECTOMY  06/22/2023   Procedure: POLYPECTOMY;  Surgeon: Franky Macho, MD;  [facility];  Service: Endoscopy;;   SUBMUCOSAL LIFTING INJECTION  06/22/2023   Procedure: SUBMUCOSAL LIFTING INJECTION;  Surgeon: Franky Macho, MD;  [facility];  Service: Endoscopy;;   THYROID LOBECTOMY Left    TONSILLECTOMY     URETERAL REIMPLANTION Left    VAGINAL PROLAPSE REPAIR     used cadaver tissue    Current Outpatient Medications  Medication Sig Dispense Refill   albuterol (VENTOLIN HFA) 108 (90 Base) MCG/ACT inhaler Inhale 2 puffs into the lungs every 6 (six) hours as needed for wheezing or shortness of breath. 18 g 2   cholecalciferol (VITAMIN D3) 25 MCG (1000 UNIT) tablet Take 1,000 Units by mouth daily.     furosemide (LASIX) 20 MG tablet TAKE 1 TABLET (20 MG TOTAL) BY MOUTH AS NEEDED (FOR SWELLING). 90 tablet 1  KLOR-CON M20 20 MEQ tablet TAKE 1 TABLET (20 MEQ TOTAL) BY MOUTH AS NEEDED. ONLY WHEN TAKING LASIX 20 MG. 90 tablet 1   levothyroxine (EUTHYROX) 200 MCG tablet Take 1 tablet (200 mcg total) by mouth daily before breakfast. 90 tablet 3   lisinopril (ZESTRIL) 20 MG tablet TAKE 1 TABLET (20 MG TOTAL) BY MOUTH 2 (TWO) TIMES DAILY AT 10 AM AND 5 PM. 180 tablet 2   Medium Chain Triglycerides (MCT OIL PO) Take 2,000 mg by mouth daily.     metoprolol tartrate (LOPRESSOR) 25 MG tablet Take 0.5 tablets (12.5 mg total) by mouth 2 (two) times daily. (May take an additional 12.5 as needed for palpitations) 45 tablet 6   triamcinolone (NASACORT) 55 MCG/ACT AERO nasal inhaler Place 2 sprays into the  nose daily.     No current facility-administered medications for this visit.    Allergies as of 01/08/2024 - Review Complete 01/08/2024  Allergen Reaction Noted   Erythromycin Swelling 08/15/2018    Social History   Socioeconomic History   Marital status: Single    Spouse name: Not on file   Number of children: Not on file   Years of education: Not on file   Highest education level: GED or equivalent  Occupational History   Not on file  Tobacco Use   Smoking status: Former    Types: Cigarettes    Start date: 08/26/2022    Passive exposure: Past   Smokeless tobacco: Never  Vaping Use   Vaping status: Never Used  Substance and Sexual Activity   Alcohol use: Yes    Comment: rarely - once a year.   Drug use: Never   Sexual activity: Not Currently  Other Topics Concern   Not on file  Social History Narrative   Not on file   Social Drivers of Health   Financial Resource Strain: Medium Risk (12/04/2023)   Overall Financial Resource Strain (CARDIA)    Difficulty of Paying Living Expenses: Somewhat hard  Food Insecurity: Food Insecurity Present (12/04/2023)   Hunger Vital Sign    Worried About Running Out of Food in the Last Year: Sometimes true    Ran Out of Food in the Last Year: Never true  Transportation Needs: No Transportation Needs (12/04/2023)   PRAPARE - Administrator, Civil Service (Medical): No    Lack of Transportation (Non-Medical): No  Physical Activity: Inactive (12/04/2023)   Exercise Vital Sign    Days of Exercise per Week: 0 days    Minutes of Exercise per Session: 30 min  Stress: Stress Concern Present (12/04/2023)   [employer] of Occupational Health - Occupational Stress Questionnaire    Feeling of Stress : To some extent  Social Connections: Moderately Isolated (12/04/2023)   Social Connection and Isolation Panel [NHANES]    Frequency of Communication with Friends and Family: More than three times a week    Frequency of Social Gatherings  with Friends and Family: Three times a week    Attends Religious Services: More than 4 times per year    Active Member of Clubs or Organizations: No    Attends Engineer, structural: Not on file    Marital Status: Divorced    Review of systems General: negative for malaise, night sweats, fever, chills, weight loss Neck: Negative for lumps, goiter, pain and significant neck swelling Resp: Negative for cough, wheezing, dyspnea at rest CV: Negative for chest pain, leg swelling, palpitations, orthopnea GI: denies melena, nausea, vomiting, dysphagia,  odyonophagia, early satiety or unintentional weight loss. +toilet tissue hematochezia +diarrhea +constipation  The remainder of the review of systems is noncontributory.  Physical Exam: BP (!) 164/90 (BP Location: Right Arm, Patient Position: Sitting)   Pulse 70   Temp 97.7 F (36.5 C) (Temporal)   Ht 5\' 7"  (1.702 m)   Wt 292 lb (132.5 kg)   BMI 45.73 kg/m  General:   Alert and oriented. No distress noted. Pleasant and cooperative.  Head:  Normocephalic and atraumatic. Eyes:  Conjuctiva clear without scleral icterus. Mouth:  Oral mucosa pink and moist. Good dentition. No lesions. Heart: Normal rate and rhythm, s1 and s2 heart sounds present.  Lungs: Clear lung sounds in all lobes. Respirations equal and unlabored. Abdomen:  +BS, soft, non-tender and non-distended. No rebound or guarding. No HSM or masses noted. Neurologic:  Alert and  oriented x4 Psych:  Alert and cooperative. Normal mood and affect.  Invalid input(s): "6 MONTHS"   ASSESSMENT: Chesnee Floren is a 63 y.o. female presenting today for follow up of IBS-D  Patient doing well today, long history of IBS-D which is well managed with lifestyle modifications and Imodium PRN. She does endorse history of pelvic floor dysfunction for which she has seen pelvic floor physical therapy for in the past but did not return for further therapy regarding her bowel dysfunction due  to her schedule and having to travel to [place]. She states if she could find someone closer than [place], she would be interested in seeing them again which I think would be beneficial for her. I advised her I will look into this further and let her know if there is a therapist that comes to [place], if so I will be happy to refer her. At this time, patient has no GI complaints, she prefers to follow up with [place] on PRN basis.    PLAN:  -consider going back to [facility] -continue imodium PRN  - repeat Colonoscopy 3 years   All questions were answered, patient verbalized understanding and is in agreement with plan as outlined above.   Follow Up: PRN   Milo Solana L. Jeanmarie Hubert, MSN, APRN, AGNP-C Adult-Gerontology Nurse Practitioner [facility]

## 2024-01-08 NOTE — Patient Instructions (Signed)
 As discussed, we will plan to see you on as needed basis If I find out there is a pelvic floor physical therapist closer than [place] I will let you know Please reach out if you have any new or worsening GI issues  It was a pleasure to see you today. I want to create trusting relationships with patients and provide genuine, compassionate, and quality care. I truly value your feedback! please be on the lookout for a survey regarding your visit with me today. I appreciate your input about our visit and your time in completing this!    Sullivan Blasing L. Jeanmarie Hubert, MSN, APRN, AGNP-C Adult-Gerontology Nurse Practitioner [facility]

## 2024-03-04 ENCOUNTER — Encounter: Payer: Self-pay | Admitting: Family Medicine

## 2024-03-06 ENCOUNTER — Other Ambulatory Visit: Payer: Self-pay | Admitting: Family Medicine

## 2024-03-06 DIAGNOSIS — I10 Essential (primary) hypertension: Secondary | ICD-10-CM

## 2024-03-06 MED ORDER — LOSARTAN POTASSIUM 50 MG PO TABS: 50.0000 mg | ORAL_TABLET | Freq: Every day | ORAL | 1 refills | Status: DC

## 2024-03-25 ENCOUNTER — Ambulatory Visit: Admitting: Family Medicine

## 2024-04-03 ENCOUNTER — Ambulatory Visit: Payer: Self-pay | Admitting: Family Medicine

## 2024-04-03 ENCOUNTER — Encounter: Payer: Self-pay | Admitting: Family Medicine

## 2024-04-03 ENCOUNTER — Ambulatory Visit: Admitting: Family Medicine

## 2024-04-03 VITALS — BP 142/80 | HR 75 | Temp 97.5°F | Ht 67.0 in | Wt 281.2 lb

## 2024-04-03 DIAGNOSIS — E890 Postprocedural hypothyroidism: Secondary | ICD-10-CM

## 2024-04-03 DIAGNOSIS — R7303 Prediabetes: Secondary | ICD-10-CM | POA: Insufficient documentation

## 2024-04-03 DIAGNOSIS — I10 Essential (primary) hypertension: Secondary | ICD-10-CM | POA: Diagnosis not present

## 2024-04-03 DIAGNOSIS — E782 Mixed hyperlipidemia: Secondary | ICD-10-CM

## 2024-04-03 DIAGNOSIS — E559 Vitamin D deficiency, unspecified: Secondary | ICD-10-CM

## 2024-04-03 DIAGNOSIS — R590 Localized enlarged lymph nodes: Secondary | ICD-10-CM | POA: Diagnosis not present

## 2024-04-03 LAB — BAYER DCA HB A1C WAIVED: HB A1C (BAYER DCA - WAIVED): 5.9 % — ABNORMAL HIGH (ref 4.8–5.6)

## 2024-04-03 MED ORDER — LOSARTAN POTASSIUM 100 MG PO TABS: 100.0000 mg | ORAL_TABLET | Freq: Every day | ORAL | 1 refills | Status: DC

## 2024-04-03 NOTE — Progress Notes (Addendum)
 Subjective:  Patient ID: Margaret Schmitt, female    DOB: 27-Dec-1960, 63 y.o.   MRN: [id number]  Patient Care Team: Severa Rock HERO, FNP as PCP - General (Family Medicine) Alvan Dorn FALCON, MD as PCP - Cardiology (Cardiology)   Chief Complaint:  Medical Management of Chronic Issues   HPI: Margaret Schmitt is a 63 y.o. female presenting on 04/03/2024 for Medical Management of Chronic Issues   Margaret Schmitt is a 63 year old female with hypertension who presents for follow-up on her blood pressure management and palpitations.  Her blood pressure has been elevated, and she has not been taking her Lasix  regularly. She is currently on a reduced dose of losartan  at 50 mg in the morning and metoprolol  at night. She also takes vitamin D3 and Aleve. She was previously on a 100 mg dose of losartan  and has been mostly consistent with her medication regimen, except for one missed dose recently.  She experiences persistent and concerning palpitations. During one episode, her heart 'started doing it' and lasted for a prolonged period, causing her to consider calling 911. She takes metoprolol  for these palpitations, but it does not seem to alleviate the symptoms. She currently takes 12.5 mg of metoprolol  in the evening.  She has experienced significant weight loss, losing 11 pounds since her last visit to the gastroenterologist, despite consuming a diet high in sweets and carbohydrates. She is surprised at the weight loss given her dietary habits.  She mentions having achy bones and acknowledges a need for more physical activity. She had to cancel her gym membership but plans to walk around the block to stay active.  She has a history of a persistent lymph node in the same area for over a year. An ultrasound at that time showed no malignancy or other concerns. The lymph node has been present for over a year, and it does not cause pain unless pressed hard.  She was recently fired from her job due to  issues with FMLA paperwork and has been considering partial retirement. She will be 72 in August.          Relevant past medical, surgical, family, and social history reviewed and updated as indicated.  Allergies and medications reviewed and updated. Data reviewed: Chart in Epic.   Past Medical History:  Diagnosis Date   Asthma    Eczema    History of diverticulitis    Hypertension    Hypertension    Hypothyroidism    IBS (irritable bowel syndrome)    diagnose when living in [place]   Morbid obesity (HCC)    OSA (obstructive sleep apnea)    Thyroid  cancer (HCC)    Thyroid  disease    hypothyroidism    Past Surgical History:  Procedure Laterality Date   ABDOMINAL HYSTERECTOMY     ADENOIDECTOMY     APPENDECTOMY     BIOPSY  06/22/2023   Procedure: BIOPSY;  Surgeon: Cinderella Deatrice FALCON, MD;  Location: [facility];  Service: Endoscopy;;   CHOLECYSTECTOMY     COLONOSCOPY     around 2016. removed polyps.pt thinks she  was to repeat in 5 years. tcs done in [place] .   COLONOSCOPY WITH PROPOFOL  N/A 06/22/2023   Procedure: COLONOSCOPY WITH PROPOFOL ;  Surgeon: Cinderella Deatrice FALCON, MD;  Location: [facility];  Service: Endoscopy;  Laterality: N/A;  1:15pm;asa 3   POLYPECTOMY  06/22/2023   Procedure: POLYPECTOMY;  Surgeon: Cinderella Deatrice FALCON, MD;  [facility];  Service: Endoscopy;;   SUBMUCOSAL LIFTING INJECTION  06/22/2023   Procedure: SUBMUCOSAL LIFTING INJECTION;  Surgeon: Cinderella Deatrice FALCON, MD;  Location: [facility];  Service: Endoscopy;;   THYROID  LOBECTOMY Left    TONSILLECTOMY     URETERAL REIMPLANTION Left    VAGINAL PROLAPSE REPAIR     used cadaver tissue    Social History   Socioeconomic History   Marital status: Single    Spouse name: Not on file   Number of children: Not on file   Years of education: Not on file   Highest education level: GED or equivalent  Occupational History   Not on file  Tobacco Use   Smoking status: Former    Types: Cigarettes     Start date: 08/26/2022    Passive exposure: Past   Smokeless tobacco: Never  Vaping Use   Vaping status: Never Used  Substance and Sexual Activity   Alcohol use: Yes    Comment: rarely - once a year.   Drug use: Never   Sexual activity: Not Currently  Other Topics Concern   Not on file  Social History Narrative   Not on file   Social Drivers of Health   Financial Resource Strain: Medium Risk (12/04/2023)   Overall Financial Resource Strain (CARDIA)    Difficulty of Paying Living Expenses: Somewhat hard  Food Insecurity: Food Insecurity Present (12/04/2023)   Hunger Vital Sign    Worried About Running Out of Food in the Last Year: Sometimes true    Ran Out of Food in the Last Year: Never true  Transportation Needs: No Transportation Needs (12/04/2023)   PRAPARE - Administrator, Civil Service (Medical): No    Lack of Transportation (Non-Medical): No  Physical Activity: Unknown (12/04/2023)   Exercise Vital Sign    Days of Exercise per Week: 0 days    Minutes of Exercise per Session: Not on file  Recent Concern: Physical Activity - Inactive (12/04/2023)   Exercise Vital Sign    Days of Exercise per Week: 0 days    Minutes of Exercise per Session: 30 min  Stress: Stress Concern Present (12/04/2023)   [employer] of Occupational Health - Occupational Stress Questionnaire    Feeling of Stress : To some extent  Social Connections: Moderately Isolated (12/04/2023)   Social Connection and Isolation Panel [NHANES]    Frequency of Communication with Friends and Family: More than three times a week    Frequency of Social Gatherings with Friends and Family: Three times a week    Attends Religious Services: More than 4 times per year    Active Member of Clubs or Organizations: No    Attends Banker Meetings: Not on file    Marital Status: Divorced  Intimate Partner Violence: Not At Risk (05/08/2022)   Received [facility]   Humiliation,  Afraid, Rape, and Kick questionnaire    Fear of Current or Ex-Partner: No    Emotionally Abused: No    Physically Abused: No    Sexually Abused: No    Outpatient Encounter Medications as of 04/03/2024  Medication Sig   albuterol  (VENTOLIN  HFA) 108 (90 Base) MCG/ACT inhaler Inhale 2 puffs into the lungs every 6 (six) hours as needed for wheezing or shortness of breath.   cholecalciferol (VITAMIN D3) 25 MCG (1000 UNIT) tablet Take 1,000 Units by mouth daily.   KLOR-CON  M20 20 MEQ tablet TAKE 1 TABLET (20 MEQ TOTAL) BY  MOUTH AS NEEDED. ONLY WHEN TAKING LASIX  20 MG.   levothyroxine  (EUTHYROX ) 200 MCG tablet Take 1 tablet (200 mcg total) by mouth daily before breakfast.   losartan  (COZAAR ) 100 MG tablet Take 1 tablet (100 mg total) by mouth daily.   Medium Chain Triglycerides (MCT OIL PO) Take 2,000 mg by mouth daily.   metoprolol  tartrate (LOPRESSOR ) 25 MG tablet Take 0.5 tablets (12.5 mg total) by mouth 2 (two) times daily. (May take an additional 12.5 as needed for palpitations)   triamcinolone  (NASACORT ) 55 MCG/ACT AERO nasal inhaler Place 2 sprays into the nose daily.   [DISCONTINUED] losartan  (COZAAR ) 50 MG tablet Take 1 tablet (50 mg total) by mouth daily.   furosemide  (LASIX ) 20 MG tablet TAKE 1 TABLET (20 MG TOTAL) BY MOUTH AS NEEDED (FOR SWELLING).   No facility-administered encounter medications on file as of 04/03/2024.    Allergies  Allergen Reactions   Erythromycin Swelling    Pertinent ROS per HPI, otherwise unremarkable      Objective:  BP (!) 142/80 (BP Location: Right Arm, Cuff Size: Normal)   Pulse 75   Temp (!) 97.5 F (36.4 C)   Ht 5' 7 (1.702 m)   Wt 281 lb 3.2 oz (127.6 kg)   SpO2 97%   BMI 44.04 kg/m    Wt Readings from Last 3 Encounters:  04/03/24 281 lb 3.2 oz (127.6 kg)  01/08/24 292 lb (132.5 kg)  12/17/23 288 lb (130.6 kg)    Physical Exam Vitals and nursing note reviewed.  Constitutional:      General: She is not in acute distress.     Appearance: Normal appearance. She is morbidly obese. She is not ill-appearing, toxic-appearing or diaphoretic.  HENT:     Head: Normocephalic and atraumatic.     Mouth/Throat:     Mouth: Mucous membranes are moist.  Eyes:     Pupils: Pupils are equal, round, and reactive to light.  Cardiovascular:     Rate and Rhythm: Normal rate and regular rhythm.     Heart sounds: Normal heart sounds.  Pulmonary:     Effort: Pulmonary effort is normal.     Breath sounds: Normal breath sounds.  Musculoskeletal:     Right lower leg: No edema.     Left lower leg: No edema.  Skin:    General: Skin is warm and dry.     Capillary Refill: Capillary refill takes less than 2 seconds.  Neurological:     General: No focal deficit present.     Mental Status: She is alert and oriented to person, place, and time.  Psychiatric:        Mood and Affect: Mood normal.        Behavior: Behavior normal. Behavior is cooperative.        Thought Content: Thought content normal.        Judgment: Judgment normal.       Results for orders placed or performed in visit on 12/04/23  Bayer DCA Hb A1c Waived   Collection Time: 12/04/23  3:05 PM  Result Value Ref Range   HB A1C (BAYER DCA - WAIVED) 5.8 (H) 4.8 - 5.6 %  Anemia Profile B   Collection Time: 12/04/23  3:08 PM  Result Value Ref Range   Total Iron Binding Capacity 331 250 - 450 ug/dL   UIBC 718 881 - 630 ug/dL   Iron 50 27 - 860 ug/dL   Iron Saturation 15 15 - 55 %  Ferritin 58 15 - 150 ng/mL   Vitamin B-12 414 232 - 1,245 pg/mL   Folate 4.3 >3.0 ng/mL   WBC 8.4 3.4 - 10.8 x10E3/uL   RBC 5.14 3.77 - 5.28 x10E6/uL   Hemoglobin 13.7 11.1 - 15.9 g/dL   Hematocrit 58.1 65.9 - 46.6 %   MCV 81 79 - 97 fL   MCH 26.7 26.6 - 33.0 pg   MCHC 32.8 31.5 - 35.7 g/dL   RDW 85.6 88.2 - 84.5 %   Platelets 286 150 - 450 x10E3/uL   Neutrophils 57 Not Estab. %   Lymphs 27 Not Estab. %   Monocytes 8 Not Estab. %   Eos 6 Not Estab. %   Basos 2 Not Estab. %    Neutrophils Absolute 4.8 1.4 - 7.0 x10E3/uL   Lymphocytes Absolute 2.3 0.7 - 3.1 x10E3/uL   Monocytes Absolute 0.7 0.1 - 0.9 x10E3/uL   EOS (ABSOLUTE) 0.5 (H) 0.0 - 0.4 x10E3/uL   Basophils Absolute 0.1 0.0 - 0.2 x10E3/uL   Immature Granulocytes 0 Not Estab. %   Immature Grans (Abs) 0.0 0.0 - 0.1 x10E3/uL   Retic Ct Pct 1.3 0.6 - 2.6 %  CMP14+EGFR   Collection Time: 12/04/23  3:08 PM  Result Value Ref Range   Glucose 97 70 - 99 mg/dL   BUN 17 8 - 27 mg/dL   Creatinine, Ser 8.92 (H) 0.57 - 1.00 mg/dL   eGFR 59 (L) >40 fO/fpw/8.26   BUN/Creatinine Ratio 16 12 - 28   Sodium 144 134 - 144 mmol/L   Potassium 4.4 3.5 - 5.2 mmol/L   Chloride 106 96 - 106 mmol/L   CO2 24 20 - 29 mmol/L   Calcium 9.5 8.7 - 10.3 mg/dL   Total Protein 6.8 6.0 - 8.5 g/dL   Albumin 4.2 3.9 - 4.9 g/dL   Globulin, Total 2.6 1.5 - 4.5 g/dL   Bilirubin Total 0.2 0.0 - 1.2 mg/dL   Alkaline Phosphatase 85 44 - 121 IU/L   AST 17 0 - 40 IU/L   ALT 15 0 - 32 IU/L  Lipid panel   Collection Time: 12/04/23  3:08 PM  Result Value Ref Range   Cholesterol, Total 197 100 - 199 mg/dL   Triglycerides 834 (H) 0 - 149 mg/dL   HDL 55 >60 mg/dL   VLDL Cholesterol Cal 29 5 - 40 mg/dL   LDL Chol Calc (NIH) 886 (H) 0 - 99 mg/dL   Chol/HDL Ratio 3.6 0.0 - 4.4 ratio  Thyroid  Panel With TSH   Collection Time: 12/04/23  3:08 PM  Result Value Ref Range   TSH 3.650 0.450 - 4.500 uIU/mL   T4, Total 9.3 4.5 - 12.0 ug/dL   T3 Uptake Ratio 26 24 - 39 %   Free Thyroxine Index 2.4 1.2 - 4.9  VITAMIN D  25 Hydroxy (Vit-D Deficiency, Fractures)   Collection Time: 12/04/23  3:08 PM  Result Value Ref Range   Vit D, 25-Hydroxy 25.3 (L) 30.0 - 100.0 ng/mL       Pertinent labs & imaging results that were available during my care of the patient were reviewed by me and considered in my medical decision making.  Assessment & Plan:  Shenna was seen today for medical management of chronic issues.  Diagnoses and all orders for this  visit:  Mixed hyperlipidemia -     CMP14+EGFR -     Lipid panel  Vitamin D  deficiency -     CMP14+EGFR -  VITAMIN D  25 Hydroxy (Vit-D Deficiency, Fractures)  Essential hypertension -     CMP14+EGFR -     Lipid panel -     losartan  (COZAAR ) 100 MG tablet; Take 1 tablet (100 mg total) by mouth daily. -     Thyroid  Panel With TSH  Morbid obesity (HCC) -     Bayer DCA Hb A1c Waived -     CMP14+EGFR -     Lipid panel -     VITAMIN D  25 Hydroxy (Vit-D Deficiency, Fractures) -     Thyroid  Panel With TSH  Pre-diabetes -     Bayer DCA Hb A1c Waived -     CMP14+EGFR -     Lipid panel  Postoperative hypothyroidism -     Thyroid  Panel With TSH  Lymphadenopathy, cervical Persistent, will reach out to radiology to determine what imaging to obtain as US  was unremarkable.       Palpitations Experiencing palpitations interfering with daily activities, not relieved by current metoprolol  regimen. Currently taking 12.5 mg of metoprolol  in the evening only, contrary to prescribed twice-daily dosing. - Adjust metoprolol  to 12.5 mg twice daily, morning and evening, for better heart rate control. - Consult cardiologist Dr. Alvan regarding metoprolol  dosing changes.  Hypertension Blood pressure elevated at 142/80. Reports inconsistent use of Lasix . Currently on 50 mg losartan  and metoprolol . Concerned about interactions between metoprolol  and losartan , but she is safe together. - Increase losartan  to 100 mg daily. - Instruct to monitor blood pressure regularly and report if above 140/90. - Check blood pressure manually before departure.  Thyroid  Disorder Palpitations could be related to thyroid  function. Thyroid  panel not initially ordered. - Order thyroid  panel to assess thyroid  function.  Unintentional Weight Loss Unintentional weight loss of 11 pounds since last visit despite increased intake of sweets and carbohydrates. Concerning given dietary habits. - Review lab results to  assess underlying causes of weight loss.  Adenopathy Persistent adenopathy in the same area as previous ultrasound a year ago, which showed no malignancy. Likely a calcified lymph node. Further imaging recommendations needed. - Consult radiologist for appropriate imaging follow-up. - Review lab results for underlying hematological issues.  General Health Maintenance Acknowledges need for increased physical activity and has canceled gym membership. - Encourage regular physical activity, such as walking, to maintain fitness and prevent deconditioning.  Follow-up Plans discussed for ongoing management of conditions and review of lab results. - Review lab results and adjust treatment plans as necessary. - Follow up regarding blood pressure and palpitations management.           Continue all other maintenance medications.  Follow up plan: Return if symptoms worsen or fail to improve.   Continue healthy lifestyle choices, including diet (rich in fruits, vegetables, and lean proteins, and low in salt and simple carbohydrates) and exercise (at least 30 minutes of moderate physical activity daily).  Educational handout given for health maintenance   The above assessment and management plan was discussed with the patient. The patient verbalized understanding of and has agreed to the management plan. Patient is aware to call the clinic if they develop any new symptoms or if symptoms persist or worsen. Patient is aware when to return to the clinic for a follow-up visit. Patient educated on when it is appropriate to go to the emergency department.   Rosaline Bruns, FNP-C [facility] [phone]

## 2024-04-03 NOTE — Patient Instructions (Addendum)
 Vit C Azelic Acid Sunscreen   The Ordinary [place]ay

## 2024-04-05 LAB — SPECIMEN STATUS REPORT

## 2024-04-05 LAB — THYROID PANEL WITH TSH
Free Thyroxine Index: 3.2 (ref 1.2–4.9)
T3 Uptake Ratio: 30 % (ref 24–39)
T4, Total: 10.6 ug/dL (ref 4.5–12.0)
TSH: 0.278 u[IU]/mL — ABNORMAL LOW (ref 0.450–4.500)

## 2024-04-08 LAB — LIPID PANEL

## 2024-04-09 LAB — LIPID PANEL
Cholesterol, Total: 172 mg/dL (ref 100–199)
HDL: 58 mg/dL (ref >39)
LDL CALC COMMENT:: 3 ratio (ref 0.0–4.4)
LDL Chol Calc (NIH): 90 mg/dL (ref 0–99)
Triglycerides: 136 mg/dL (ref 0–149)
VLDL Cholesterol Cal: 24 mg/dL (ref 5–40)

## 2024-04-09 LAB — CMP14+EGFR
ALT: 7 IU/L (ref 0–32)
AST: 14 IU/L (ref 0–40)
Albumin: 4.2 g/dL (ref 3.9–4.9)
Alkaline Phosphatase: 99 IU/L (ref 44–121)
BUN/Creatinine Ratio: 23 (ref 12–28)
BUN: 23 mg/dL (ref 8–27)
CO2: 16 mmol/L — ABNORMAL LOW (ref 20–29)
Calcium: 9 mg/dL (ref 8.7–10.3)
Chloride: 106 mmol/L (ref 96–106)
Creatinine, Ser: 1 mg/dL (ref 0.57–1.00)
Globulin, Total: 2.6 g/dL (ref 1.5–4.5)
Glucose: 111 mg/dL — ABNORMAL HIGH (ref 70–99)
Potassium: 4.7 mmol/L (ref 3.5–5.2)
Sodium: 144 mmol/L (ref 134–144)
Total Protein: 6.8 g/dL (ref 6.0–8.5)
eGFR: 64 mL/min/{1.73_m2} (ref >59)

## 2024-04-09 LAB — VITAMIN D 25 HYDROXY (VIT D DEFICIENCY, FRACTURES): Vit D, 25-Hydroxy: 31.4 ng/mL (ref 30.0–100.0)

## 2024-06-02 ENCOUNTER — Other Ambulatory Visit: Payer: Self-pay | Admitting: Family Medicine

## 2024-06-02 DIAGNOSIS — Z85850 Personal history of malignant neoplasm of thyroid: Secondary | ICD-10-CM

## 2024-06-02 DIAGNOSIS — E890 Postprocedural hypothyroidism: Secondary | ICD-10-CM

## 2024-06-24 ENCOUNTER — Other Ambulatory Visit: Payer: Self-pay | Admitting: Medical Genetics

## 2024-06-25 ENCOUNTER — Other Ambulatory Visit (HOSPITAL_COMMUNITY)

## 2024-07-01 ENCOUNTER — Other Ambulatory Visit (HOSPITAL_COMMUNITY)

## 2024-07-07 ENCOUNTER — Other Ambulatory Visit (HOSPITAL_COMMUNITY)
Admission: RE | Admit: 2024-07-07 | Discharge: 2024-07-07 | Disposition: A | Discharge status: Home / Self Care | Payer: Self-pay | Source: Ambulatory Visit | Attending: Oncology | Admitting: Oncology

## 2024-07-15 LAB — GENECONNECT MOLECULAR SCREEN: Genetic Analysis Overall Interpretation: NEGATIVE

## 2024-08-07 ENCOUNTER — Ambulatory Visit: Admitting: Family Medicine

## 2024-08-07 ENCOUNTER — Encounter: Payer: Self-pay | Admitting: Family Medicine

## 2024-08-07 VITALS — BP 159/97 | HR 72 | Temp 97.1°F | Ht 67.0 in | Wt 276.6 lb

## 2024-08-07 DIAGNOSIS — M25561 Pain in right knee: Secondary | ICD-10-CM | POA: Diagnosis not present

## 2024-08-07 DIAGNOSIS — G8929 Other chronic pain: Secondary | ICD-10-CM

## 2024-08-07 DIAGNOSIS — M5442 Lumbago with sciatica, left side: Secondary | ICD-10-CM

## 2024-08-07 MED ORDER — METHYLPREDNISOLONE ACETATE 80 MG/ML IJ SUSP
80.0000 mg | Freq: Once | INTRAMUSCULAR | Status: AC
  Administered 2024-08-07: 60 mg via INTRAMUSCULAR

## 2024-08-07 NOTE — Progress Notes (Signed)
 Subjective:  Patient ID: Margaret Schmitt, female    DOB: 1961/07/25, 63 y.o.   MRN: [id number]  Patient Care Team: Severa Rock HERO, FNP as PCP - General (Family Medicine) Alvan Dorn FALCON, MD as PCP - Cardiology (Cardiology)   Chief Complaint:  Back Pain (Lower back pain that has been going on for awhile but has gotten worse.  States she has also been having numbness on her left thigh.  ) and Knee Pain (Right knee pain.  Seen ortho and was told to lose weight.  States that the pain has gotten worse. )   HPI: Margaret Schmitt is a 63 y.o. female presenting on 08/07/2024 for Back Pain (Lower back pain that has been going on for awhile but has gotten worse.  States she has also been having numbness on her left thigh.  ) and Knee Pain (Right knee pain.  Seen ortho and was told to lose weight.  States that the pain has gotten worse. )  Margaret Schmitt is a 63 year old female who presents with back and knee pain.  She experiences persistent pain in her right knee, exacerbated by movement, with a popping sensation and significant discomfort, particularly when getting up from a reclined position. She has previously consulted an orthopedic specialist who recommended weight loss and exercises, but there has been no improvement. She has not attended any follow-up appointments with the orthopedic specialist.  Her lower back pain has been a chronic issue for years, with an MRI conducted approximately ten years ago revealing some findings. The pain is intermittent, with episodes of numbness and pain in the sciatic nerve distribution, especially when pressure is applied, such as when her cat jumps on her. Recently, while working part-time at [employer], she experienced significant pain while pulling a cart, necessitating a pause to find a place to sit down. She recalls having undergone an outpatient procedure involving injections for her back in the past, which she tolerated well, unlike a painful hip  injection she had years ago.  She currently manages her symptoms with Tylenol and ibuprofen  but is reluctant to take additional medications. She has previously received Depo-Medrol injections for allergies, which she found helpful for inflammation.  She recently moved and now lives with her mother. She works part-time at [employer].          Relevant past medical, surgical, family, and social history reviewed and updated as indicated.  Allergies and medications reviewed and updated. Data reviewed: Chart in Epic.   Past Medical History:  Diagnosis Date   Asthma    Eczema    History of diverticulitis    Hypertension    Hypertension    Hypothyroidism    IBS (irritable bowel syndrome)    diagnose when living in [place]   Morbid obesity (HCC)    OSA (obstructive sleep apnea)    Thyroid  cancer (HCC)    Thyroid  disease    hypothyroidism    Past Surgical History:  Procedure Laterality Date   ABDOMINAL HYSTERECTOMY     ADENOIDECTOMY     APPENDECTOMY     BIOPSY  06/22/2023   Procedure: BIOPSY;  Surgeon: Cinderella Deatrice FALCON, MD;  [facility];  Service: Endoscopy;;   CHOLECYSTECTOMY     COLONOSCOPY     around 2016. removed polyps.pt thinks she  was to repeat in 5 years. tcs done in [place] .   COLONOSCOPY WITH PROPOFOL  N/A 06/22/2023   Procedure: COLONOSCOPY WITH PROPOFOL ;  Surgeon: Cinderella Deatrice FALCON, MD;  Location: [facility];  Service: Endoscopy;  Laterality: N/A;  1:15pm;asa 3   POLYPECTOMY  06/22/2023   Procedure: POLYPECTOMY;  Surgeon: Cinderella Deatrice FALCON, MD;  Location: [facility];  Service: Endoscopy;;   SUBMUCOSAL LIFTING INJECTION  06/22/2023   Procedure: SUBMUCOSAL LIFTING INJECTION;  Surgeon: Cinderella Deatrice FALCON, MD;  Location: [facility];  Service: Endoscopy;;   THYROID  LOBECTOMY Left    TONSILLECTOMY     URETERAL REIMPLANTION Left    VAGINAL PROLAPSE REPAIR     used cadaver tissue    Social History   Socioeconomic History   Marital status: Single     Spouse name: Not on file   Number of children: Not on file   Years of education: Not on file   Highest education level: GED or equivalent  Occupational History   Not on file  Tobacco Use   Smoking status: Former    Types: Cigarettes    Start date: 08/26/2022    Passive exposure: Past   Smokeless tobacco: Never  Vaping Use   Vaping status: Never Used  Substance and Sexual Activity   Alcohol use: Yes    Comment: rarely - once a year.   Drug use: Never   Sexual activity: Not Currently  Other Topics Concern   Not on file  Social History Narrative   Not on file   Social Drivers of Health   Financial Resource Strain: Medium Risk (12/04/2023)   Overall Financial Resource Strain (CARDIA)    Difficulty of Paying Living Expenses: Somewhat hard  Food Insecurity: Food Insecurity Present (12/04/2023)   Hunger Vital Sign    Worried About Running Out of Food in the Last Year: Sometimes true    Ran Out of Food in the Last Year: Never true  Transportation Needs: No Transportation Needs (12/04/2023)   PRAPARE - Administrator, Civil Service (Medical): No    Lack of Transportation (Non-Medical): No  Physical Activity: Unknown (12/04/2023)   Exercise Vital Sign    Days of Exercise per Week: 0 days    Minutes of Exercise per Session: Not on file  Recent Concern: Physical Activity - Inactive (12/04/2023)   Exercise Vital Sign    Days of Exercise per Week: 0 days    Minutes of Exercise per Session: 30 min  Stress: Stress Concern Present (12/04/2023)   [employer] of Occupational Health - Occupational Stress Questionnaire    Feeling of Stress : To some extent  Social Connections: Moderately Isolated (12/04/2023)   Social Connection and Isolation Panel    Frequency of Communication with Friends and Family: More than three times a week    Frequency of Social Gatherings with Friends and Family: Three times a week    Attends Religious Services: More than 4 times per year    Active Member  of Clubs or Organizations: No    Attends Banker Meetings: Not on file    Marital Status: Divorced  Intimate Partner Violence: Not At Risk (05/08/2022)   Received from [facility]   Humiliation, Afraid, Rape, and Kick questionnaire    Within the last year, have you been afraid of your partner or ex-partner?: No    Within the last year, have you been humiliated or emotionally abused in other ways by your partner or ex-partner?: No    Within the last year, have you been kicked, hit, slapped, or otherwise physically hurt by your partner or ex-partner?: No  Within the last year, have you been raped or forced to have any kind of sexual activity by your partner or ex-partner?: No    Outpatient Encounter Medications as of 08/07/2024  Medication Sig   albuterol  (VENTOLIN  HFA) 108 (90 Base) MCG/ACT inhaler Inhale 2 puffs into the lungs every 6 (six) hours as needed for wheezing or shortness of breath.   cholecalciferol (VITAMIN D3) 25 MCG (1000 UNIT) tablet Take 1,000 Units by mouth daily.   furosemide  (LASIX ) 20 MG tablet TAKE 1 TABLET (20 MG TOTAL) BY MOUTH AS NEEDED (FOR SWELLING).   KLOR-CON  M20 20 MEQ tablet TAKE 1 TABLET (20 MEQ TOTAL) BY MOUTH AS NEEDED. ONLY WHEN TAKING LASIX  20 MG.   levothyroxine  (SYNTHROID ) 200 MCG tablet TAKE 1 TABLET (200 MCG TOTAL) BY MOUTH DAILY BEFORE BREAKFAST.   losartan  (COZAAR ) 100 MG tablet Take 1 tablet (100 mg total) by mouth daily.   Medium Chain Triglycerides (MCT OIL PO) Take 2,000 mg by mouth daily.   metoprolol  tartrate (LOPRESSOR ) 25 MG tablet Take 0.5 tablets (12.5 mg total) by mouth 2 (two) times daily. (May take an additional 12.5 as needed for palpitations)   triamcinolone  (NASACORT ) 55 MCG/ACT AERO nasal inhaler Place 2 sprays into the nose daily.   [EXPIRED] methylPREDNISolone acetate (DEPO-MEDROL) injection 80 mg    No facility-administered encounter medications on file as of 08/07/2024.    Allergies  Allergen Reactions    Erythromycin Swelling    Pertinent ROS per HPI, otherwise unremarkable      Objective:  BP (!) 159/97   Pulse 72   Temp (!) 97.1 F (36.2 C)   Ht 5' 7 (1.702 m)   Wt 276 lb 9.6 oz (125.5 kg)   SpO2 99%   BMI 43.32 kg/m    Wt Readings from Last 3 Encounters:  08/07/24 276 lb 9.6 oz (125.5 kg)  04/03/24 281 lb 3.2 oz (127.6 kg)  01/08/24 292 lb (132.5 kg)    Physical Exam Vitals and nursing note reviewed.  Constitutional:      General: She is not in acute distress.    Appearance: Normal appearance. She is well-developed and well-groomed. She is morbidly obese. She is not ill-appearing, toxic-appearing or diaphoretic.  HENT:     Head: Normocephalic and atraumatic.     Mouth/Throat:     Mouth: Mucous membranes are moist.  Eyes:     Pupils: Pupils are equal, round, and reactive to light.  Cardiovascular:     Rate and Rhythm: Normal rate and regular rhythm.     Pulses: Normal pulses.     Heart sounds: Normal heart sounds.  Pulmonary:     Effort: Pulmonary effort is normal.     Breath sounds: Normal breath sounds.  Musculoskeletal:     Cervical back: Neck supple.     Thoracic back: Normal.     Lumbar back: Tenderness present. No swelling, edema, deformity, signs of trauma, lacerations, spasms or bony tenderness. Decreased range of motion. Positive left straight leg raise test. Negative right straight leg raise test. No scoliosis.     Right hip: Normal.     Left hip: Normal.     Right upper leg: Normal.     Left upper leg: Normal.     Right knee: No swelling, deformity, effusion, erythema, ecchymosis, lacerations, bony tenderness or crepitus. Decreased range of motion. Tenderness present. No LCL laxity, MCL laxity, ACL laxity or PCL laxity. Normal alignment, normal meniscus and normal patellar mobility. Normal pulse.  Left knee: Normal.     Right lower leg: Normal.  Skin:    General: Skin is warm and dry.     Capillary Refill: Capillary refill takes less than 2  seconds.  Neurological:     General: No focal deficit present.     Mental Status: She is alert and oriented to person, place, and time.  Psychiatric:        Mood and Affect: Mood normal.        Behavior: Behavior normal. Behavior is cooperative.        Thought Content: Thought content normal.        Judgment: Judgment normal.     Results for orders placed or performed during the hospital encounter of 07/07/24  [facility] Molecular Screen - Blood ([facility])   Collection Time: 07/07/24 11:59 AM  Result Value Ref Range   Genetic Analysis Overall Interpretation Negative    Genetic Disease Assessed      This is a screening test and does not detect all pathogenic or likely pathogenic variant(s) in the tested genes; diagnostic testing is recommended for individuals with a personal or family history of heart disease or hereditary cancer. [employer] [facility]  Population Screen is a screening test that analyzes 11 genes related to hereditary breast and ovarian cancer (HBOC) syndrome, Lynch syndrome, and familial hypercholesterolemia. This test only reports clinically significant pathogenic and likely  pathogenic variants but does not report variants of uncertain significance (VUS). In addition, analysis of the PMS2 gene excludes exons 11-15, which overlap with a known pseudogene (PMS2CL).    Genetic Analysis Report      No pathogenic or likely pathogenic variants were detected in the genes analyzed by this test.Genetic test results should be interpreted in the context of an individual's personal medical and family history. Alteration to medical management is NOT  recommended based solely on this result. Clinical correlation is advised.Additional Considerations- This is a screening test; individuals may still carry pathogenic or likely pathogenic variant(s) in the tested genes that are not detected by this test.-  For individuals at risk for these or other related conditions based on  factors including personal or family history, diagnostic testing is recommended.- The absence of pathogenic or likely pathogenic variant(s) in the analyzed genes, while reassuring,  does not eliminate the possibility of a hereditary condition; there are other variants and genes associated with heart disease and hereditary cancer that are not included in this test.    Genes Tested See Notes    Disclaimer See Notes    Sequencing Location See Notes    Interpretation Methods and Limitations See Notes        Pertinent labs & imaging results that were available during my care of the patient were reviewed by me and considered in my medical decision making.  Assessment & Plan:  Shelvie was seen today for back pain and knee pain.  Diagnoses and all orders for this visit:  Chronic bilateral low back pain with left-sided sciatica -     Ambulatory referral to Physical Therapy -     Ambulatory referral to Orthopedic Surgery -     methylPREDNISolone acetate (DEPO-MEDROL) injection 80 mg  Chronic pain of right knee -     Ambulatory referral to Physical Therapy -     Ambulatory referral to Orthopedic Surgery -     methylPREDNISolone acetate (DEPO-MEDROL) injection 80 mg      Right knee osteoarthritis Chronic right knee osteoarthritis with persistent pain  and significant discomfort, especially when moving from a reclined position. Previous orthopedic consultation was unsatisfactory, and she desires a new referral. - Refer to a new orthopedic specialist for further evaluation and management. - Administer Depo-Medrol injection to alleviate acute pain and inflammation. - Refer to physical therapy for targeted exercises and management.  Low back pain with right-sided sciatica Chronic low back pain with right-sided sciatica, exacerbated by physical activity such as pulling a cart at work. Intermittent numbness and pain in the lower back with previous MRI findings. She is open to physical therapy but not  to certain injections due to past negative experiences. - Refer to physical therapy for management of low back pain and sciatica. - Administer Depo-Medrol injection for inflammation and pain relief. - Advise use of heat therapy for symptomatic relief.          Continue all other maintenance medications.  Follow up plan: Return if symptoms worsen or fail to improve.   Continue healthy lifestyle choices, including diet (rich in fruits, vegetables, and lean proteins, and low in salt and simple carbohydrates) and exercise (at least 30 minutes of moderate physical activity daily).  Educational handout given for chronic pain  The above assessment and management plan was discussed with the patient. The patient verbalized understanding of and has agreed to the management plan. Patient is aware to call the clinic if they develop any new symptoms or if symptoms persist or worsen. Patient is aware when to return to the clinic for a follow-up visit. Patient educated on when it is appropriate to go to the emergency department.   Rosaline Bruns, FNP-C [facility] [phone]

## 2024-08-18 ENCOUNTER — Ambulatory Visit: Admitting: Physical Therapy

## 2024-08-22 ENCOUNTER — Encounter: Payer: Self-pay | Admitting: Family Medicine

## 2024-08-25 ENCOUNTER — Ambulatory Visit: Attending: Family Medicine | Admitting: Physical Therapy

## 2024-08-25 DIAGNOSIS — M25561 Pain in right knee: Secondary | ICD-10-CM | POA: Insufficient documentation

## 2024-08-25 DIAGNOSIS — M5459 Other low back pain: Secondary | ICD-10-CM | POA: Insufficient documentation

## 2024-08-25 DIAGNOSIS — M5442 Lumbago with sciatica, left side: Secondary | ICD-10-CM | POA: Diagnosis not present

## 2024-08-25 DIAGNOSIS — G8929 Other chronic pain: Secondary | ICD-10-CM | POA: Insufficient documentation

## 2024-08-25 DIAGNOSIS — R293 Abnormal posture: Secondary | ICD-10-CM | POA: Insufficient documentation

## 2024-08-25 DIAGNOSIS — M6281 Muscle weakness (generalized): Secondary | ICD-10-CM | POA: Diagnosis present

## 2024-08-25 NOTE — Therapy (Signed)
 OUTPATIENT PHYSICAL THERAPY THORACOLUMBAR EVALUATION   Patient Name: Tranice Laduke MRN: [id number] DOB:05/23/1961, 63 y.o., female Today's Date: 08/25/2024  END OF SESSION:  PT End of Session - 08/25/24 1348     Visit Number 1    Number of Visits 16    Date for Recertification  10/20/24    Authorization Type [employer]    PT Start Time 1348    PT Stop Time 1425    PT Time Calculation (min) 37 min    Activity Tolerance Patient tolerated treatment well          Past Medical History:  Diagnosis Date   Asthma    Eczema    History of diverticulitis    Hypertension    Hypertension    Hypothyroidism    IBS (irritable bowel syndrome)    diagnose when living in [place]   Morbid obesity (HCC)    OSA (obstructive sleep apnea)    Thyroid  cancer (HCC)    Thyroid  disease    hypothyroidism   Past Surgical History:  Procedure Laterality Date   ABDOMINAL HYSTERECTOMY     ADENOIDECTOMY     APPENDECTOMY     BIOPSY  06/22/2023   Procedure: BIOPSY;  Surgeon: Cinderella Deatrice FALCON, MD;  [facility]: [facility];  Service: Endoscopy;;   CHOLECYSTECTOMY     COLONOSCOPY     around 2016. removed polyps.pt thinks she  was to repeat in 5 years. tcs done in [place] .   COLONOSCOPY WITH PROPOFOL  N/A 06/22/2023   Procedure: COLONOSCOPY WITH PROPOFOL ;  Surgeon: Cinderella Deatrice FALCON, MD;  [facility]: [facility];  Service: Endoscopy;  Laterality: N/A;  1:15pm;asa 3   POLYPECTOMY  06/22/2023   Procedure: POLYPECTOMY;  Surgeon: Cinderella Deatrice FALCON, MD;  [facility]: [facility];  Service: Endoscopy;;   SUBMUCOSAL LIFTING INJECTION  06/22/2023   Procedure: SUBMUCOSAL LIFTING INJECTION;  Surgeon: Cinderella Deatrice FALCON, MD;  [facility]: [facility];  Service: Endoscopy;;   THYROID  LOBECTOMY Left    TONSILLECTOMY     URETERAL REIMPLANTION Left    VAGINAL PROLAPSE REPAIR     used cadaver tissue   Patient Active Problem List   Diagnosis Date Noted   Chronic bilateral low back pain with left-sided sciatica  08/07/2024   Chronic pain of right knee 08/07/2024   Pre-diabetes 04/03/2024   Adenomatous polyp of colon 06/22/2023   History of colonic polyps 06/05/2023   IBS (irritable bowel syndrome) 04/06/2023   Depression, recurrent 08/04/2022   GAD (generalized anxiety disorder) 08/04/2022   History of hysterectomy 04/05/2022   History of thyroid  cancer 01/03/2022   RLS (restless legs syndrome) 08/13/2020   Circadian rhythm sleep disorder, 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   Hyperlipidemia 07/20/2020   Eczema    OSA (obstructive sleep apnea)    Essential hypertension 08/22/2018   Hypothyroidism 08/22/2018   Morbid obesity (HCC) 08/22/2018    PCP: Severa Rock HERO, FNP  REFERRING PROVIDER: Severa Rock HERO, FNP  REFERRING DIAG: [id number] (ICD-10-CM) - Chronic bilateral low back pain with left-sided sciatica M25.561,G89.29 (ICD-10-CM) - Chronic pain of right knee  Rationale for Evaluation and Treatment: Rehabilitation  THERAPY DIAG:  Muscle weakness (generalized)  Abnormal posture  Other low back pain  Chronic pain of right knee  ONSET DATE: Chronic back pain but exacerbated 2-3 weeks; knee pain worsening since 2019  SUBJECTIVE:  SUBJECTIVE STATEMENT: 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 Has hurt her back lifting patients in the past. Did PT in the past and got better. Pt reports her back flares up. States she can walk/stand on hard surfaces (like in [place]) and will get some numbness in the front of her left leg. Takes tylenol for pain management which will take the edge off. Retired recently but has been doing some part time work. 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 Pain  increased with standing in place too long folding clothes. Has quit this job now. Has had numbness for quite sometime which goes to lateral toes.   In regards to the knee, she reports she struggles with standing up/down. Notes in 2019, she stood up from recliner at home and felt like something tore. Was put in splint and crutches at the time. Knee pain has worsened over time. ~6 months ago she saw orthopedic [facility] and did x-rays which showed bone spurs and knee valgus. Was recommended to lose weight and do exercises. Had not been able to do the exercises. Pt states R knee catches, pops and at times buckles.   PERTINENT HISTORY:  Chronic back pain, scoliosis, high blood pressure, prediabetes, palpitations  PAIN:  Are you having pain? Yes: NPRS scale: at rest 2 or 3, at worst 4 within this past week, has been as high as a 9 while at work Pain location: L>R lumbosacral junction Pain description: burning dull pain; radiate front of L thigh Aggravating factors: mopping/sweeping, long prolonged standing (I.e. doing dishes) Relieving factors: Tylenol, ibuprofen , heat  Yes: NPRS scale: 0 at rest, at worst 3  Pain location: R knee Pain description: sharp Aggravating factors: Standing or twisting on knee Relieving factors: Tylenol, ibuprofen , heat  PRECAUTIONS: None  RED FLAGS: None   WEIGHT BEARING RESTRICTIONS: No  FALLS:  Has patient fallen in last 6 months? Yes. Number of falls 2 -- last fall was off back steps  LIVING ENVIRONMENT: Lives with: mom and granddaughter Lives in: House/apartment Stairs: 3 steps to get in through car port, 2 steps in front of the house; level Has following equipment at home: None  OCCUPATION: Retired  PLOF: Independent  PATIENT GOALS: Decrease pain, less difficulty with work activities, improve movement, return to recreational activities, improve strength, stand and sit longer, less difficulty with home activities  NEXT MD VISIT:  PRN  OBJECTIVE:  Note: Objective measures were completed at Evaluation unless otherwise noted.  DIAGNOSTIC FINDINGS:  12/17/23 X-ray shows narrowing of the lateral compartment of both knees some narrowing of the medial compartment left knee some narrowing patellofemoral compartment right knee   Impression osteoarthritis right knee primarily lateral compartment with more narrowing than seen in October 2019   Overall alignment shows valgus of the right knee slight increase  PATIENT SURVEYS:  Modified Oswestry:  MODIFIED OSWESTRY DISABILITY SCALE  Date: 08/25/24 Score  Pain intensity 3 =  Pain medication provides me with moderate relief from pain.  2. Personal care (washing, dressing, etc.) 0 =  I can take care of myself normally without causing increased pain.  3. Lifting 3 = Pain prevents me from lifting heavy weights, but I can manage light to medium weights if they are conveniently positioned  4. Walking 1 = Pain prevents me from walking more than 1 mile.  5. Sitting  2 =  Pain prevents me from sitting more than 1 hour.  6. Standing 2 =  Pain prevents me from standing more than 1 hour  7. Sleeping 0 = Pain does not prevent me from sleeping well.  8. Social Life 2 = Pain prevents me from participating in more energetic activities (eg. sports, dancing).  9. Traveling 1 =  I can travel anywhere, but it increases my pain.  10. Employment/ Homemaking 2 = I can perform most of my homemaking/job duties, but pain prevents me from performing more physically stressful activities (eg, lifting, vacuuming).  Total 16/50   Interpretation of scores: Score Category Description  0-20% Minimal Disability The patient can cope with most living activities. Usually no treatment is indicated apart from advice on lifting, sitting and exercise  21-40% Moderate Disability The patient experiences more pain and difficulty with sitting, lifting and standing. Travel and social life are more difficult and they may be  disabled from work. Personal care, sexual activity and sleeping are not grossly affected, and the patient can usually be managed by conservative means  41-60% Severe Disability Pain remains the main problem in this group, but activities of daily living are affected. These patients require a detailed investigation  61-80% Crippled Back pain impinges on all aspects of the patient's life. Positive intervention is required  81-100% Bed-bound These patients are either bed-bound or exaggerating their symptoms  Bluford FORBES Zoe DELENA Karon DELENA, et al. Surgery versus conservative management of stable thoracolumbar fracture: the PRESTO feasibility RCT. [place] ([place]): [employer]; 2021 Nov. [facility] Technology Assessment, No. 25.62.) Appendix 3, Oswestry Disability Index category descriptors. Available from: Findjewel[employer].cz  Minimally Clinically Important Difference (MCID) = 12.8%  COGNITION: Overall cognitive status: Within functional limits for tasks assessed     SENSATION: N/T L ant thigh occasionally Can get N/T along lateral 2 toes  MUSCLE LENGTH: Hamstrings: Right ~70 deg; Left ~50 deg Thomas test: did not assess  POSTURE: No Significant postural limitations  PALPATION: TTP L SIJ, and R glute med  LUMBAR ROM:   AROM eval  Flexion 80%  Extension 100% pain  Right lateral flexion 100%  Left lateral flexion 100% slight pain  Right rotation 100%  Left rotation 100% slight pain   (Blank rows = not tested)  LOWER EXTREMITY ROM:     Active  Right eval Left eval  Hip flexion    Hip extension    Hip abduction    Hip adduction    Hip internal rotation    Hip external rotation    Knee flexion    Knee extension    Ankle dorsiflexion    Ankle plantarflexion    Ankle inversion    Ankle eversion     (Blank rows = not tested)  LOWER EXTREMITY MMT:    MMT Right eval Left eval  Hip flexion 4 4  Hip extension 3+ 3+  Hip abduction 3+ 3+ pain   Hip adduction    Hip internal rotation    Hip external rotation    Knee flexion 4 slight pain in back 4 slight pain in back  Knee extension 5 5  Ankle dorsiflexion    Ankle plantarflexion    Ankle inversion    Ankle eversion     (Blank rows = not tested)  LUMBAR SPECIAL TESTS:  Straight leg raise test: Positive and FABER test: Positive Supine to long sit: R LE lengthens, L LE shortens  FUNCTIONAL TESTS:  Did not assess  GAIT:  Distance walked: Into clinic Assistive device utilized: None Level of assistance: Complete Independence Comments: Mildly antalgic  TREATMENT DATE: Education only (see below)   PATIENT EDUCATION:  Education details: Exam findings, POC Person educated: Patient Education method: Explanation Education comprehension: verbalized understanding  HOME EXERCISE PROGRAM: Did not initiate yet  ASSESSMENT:  CLINICAL IMPRESSION: Patient is a 63 y.o. F who was seen today for physical therapy evaluation and treatment for back pain and R knee pain. PMH is significant for scoliosis and chronic back pain. Assessment demos s/s of likely L anterior inominate rotation with concurrent LE and core weakness. R knee does demo some valgus instability with R glute med tenderness but no overt pain when not in weight bearing -- pt's subjective history of buckling and catching with twisting motion sounds likely for meniscus tear. Pt will benefit from PT to improve upon these deficits to maximize her level of function at home and in the community.   OBJECTIVE IMPAIRMENTS: Abnormal gait, decreased activity tolerance, decreased balance, decreased coordination, decreased endurance, decreased mobility, difficulty walking, decreased ROM, decreased strength, hypomobility, increased fascial restrictions, increased muscle spasms, impaired flexibility, impaired sensation, improper body mechanics, postural dysfunction, and pain.   ACTIVITY LIMITATIONS: lifting, bending, standing, squatting,  stairs, transfers, and locomotion level  PARTICIPATION LIMITATIONS: meal prep, cleaning, laundry, shopping, and community activity  PERSONAL FACTORS: Age, Fitness, Past/current experiences, and Time since onset of injury/illness/exacerbation are also affecting patient's functional outcome.   REHAB POTENTIAL: Good  CLINICAL DECISION MAKING: Evolving/moderate complexity  EVALUATION COMPLEXITY: Moderate   GOALS: Goals reviewed with patient? Yes  SHORT TERM GOALS: Target date: 09/22/2024   Pt will be ind with initial HEP Baseline: Goal status: INITIAL  2.  Pt will report improved back pain by >/= 50% Baseline:  Goal status: INITIAL  3.  Pt will report improved knee pain by >/=25% Baseline:  Goal status: INITIAL    LONG TERM GOALS: Target date: 10/20/2024   Pt will be ind with management and progression of HEP Baseline:  Goal status: INITIAL  2.  Pt will be able to stand and do her dishes without having to lean on the counter  Baseline:  Goal status: INITIAL  3.  Pt will report improved modified Oswestry to </=5/50 to demo MCID Baseline:  Goal status: INITIAL  4.  Pt will be able to mop and sweep with tolerable pain levels Baseline:  Goal status: INITIAL  5.  Pt will report improved knee pain by >/=50% Baseline:  Goal status: INITIAL  6.  Pt will report improved back pain by >/=75% Baseline:  Goal status: INITIAL  PLAN:  PT FREQUENCY: 2x/week  PT DURATION: 8 weeks  PLANNED INTERVENTIONS: 97164- PT Re-evaluation, 97750- Physical Performance Testing, 97110-Therapeutic exercises, 97530- Therapeutic activity, [id number]- Neuromuscular re-education, 97535- Self Care, [id number]- Manual therapy, 97116- Gait training, [phone] (1-2 muscles), 20561 (3+ muscles)- Dry Needling, Patient/Family education, Balance training, Stair training, Joint mobilization, and Spinal mobilization  NO VASO/COLD PACK,E-STIM,TRACTION, U/S OR IONTO PER [facility] BL[facility]   PLAN FOR NEXT SESSION: Check  inominate rotation and correct as indicated. Stretch hip flexor/quad. Strengthen bilat LEs and core.    Jerimey Burridge April Ma L Morrell Fluke, PT, DPT 08/25/2024, 3:49 PM

## 2024-08-28 LAB — HM MAMMOGRAPHY

## 2024-08-29 ENCOUNTER — Encounter: Payer: Self-pay | Admitting: Family Medicine

## 2024-09-01 ENCOUNTER — Ambulatory Visit: Attending: Family Medicine | Admitting: Physical Therapy

## 2024-09-01 ENCOUNTER — Encounter: Payer: Self-pay | Admitting: Physical Therapy

## 2024-09-01 DIAGNOSIS — M25561 Pain in right knee: Secondary | ICD-10-CM | POA: Diagnosis present

## 2024-09-01 DIAGNOSIS — R279 Unspecified lack of coordination: Secondary | ICD-10-CM | POA: Diagnosis present

## 2024-09-01 DIAGNOSIS — R293 Abnormal posture: Secondary | ICD-10-CM | POA: Diagnosis present

## 2024-09-01 DIAGNOSIS — M5459 Other low back pain: Secondary | ICD-10-CM | POA: Diagnosis present

## 2024-09-01 DIAGNOSIS — G8929 Other chronic pain: Secondary | ICD-10-CM | POA: Insufficient documentation

## 2024-09-01 DIAGNOSIS — M6281 Muscle weakness (generalized): Secondary | ICD-10-CM | POA: Diagnosis present

## 2024-09-01 NOTE — Therapy (Signed)
 OUTPATIENT PHYSICAL THERAPY THORACOLUMBAR EVALUATION   Patient Name: Jennalyn Cawley MRN: [id number] DOB:1961-06-22, 63 y.o., female Today's Date: 09/01/2024  END OF SESSION:  PT End of Session - 09/01/24 1349     Visit Number 2    Number of Visits 16    Date for Recertification  10/20/24    Authorization Type [employer]    PT Start Time 1346    PT Stop Time 1425    PT Time Calculation (min) 39 min    Activity Tolerance Patient tolerated treatment well          Past Medical History:  Diagnosis Date   Asthma    Eczema    History of diverticulitis    Hypertension    Hypertension    Hypothyroidism    IBS (irritable bowel syndrome)    diagnose when living in [place]   Morbid obesity (HCC)    OSA (obstructive sleep apnea)    Thyroid  cancer (HCC)    Thyroid  disease    hypothyroidism   Past Surgical History:  Procedure Laterality Date   ABDOMINAL HYSTERECTOMY     ADENOIDECTOMY     APPENDECTOMY     BIOPSY  06/22/2023   Procedure: BIOPSY;  Surgeon: Cinderella Deatrice FALCON, MD;  Location: [facility];  Service: Endoscopy;;   CHOLECYSTECTOMY     COLONOSCOPY     around 2016. removed polyps.pt thinks she  was to repeat in 5 years. tcs done in [place] .   COLONOSCOPY WITH PROPOFOL  N/A 06/22/2023   Procedure: COLONOSCOPY WITH PROPOFOL ;  Surgeon: Cinderella Deatrice FALCON, MD;  Location: [facility];  Service: Endoscopy;  Laterality: N/A;  1:15pm;asa 3   POLYPECTOMY  06/22/2023   Procedure: POLYPECTOMY;  Surgeon: Cinderella Deatrice FALCON, MD;  Location: [facility];  Service: Endoscopy;;   SUBMUCOSAL LIFTING INJECTION  06/22/2023   Procedure: SUBMUCOSAL LIFTING INJECTION;  Surgeon: Cinderella Deatrice FALCON, MD;  Location: [facility];  Service: Endoscopy;;   THYROID  LOBECTOMY Left    TONSILLECTOMY     URETERAL REIMPLANTION Left    VAGINAL PROLAPSE REPAIR     used cadaver tissue   Patient Active Problem List   Diagnosis Date Noted   Chronic bilateral low back pain with left-sided sciatica  08/07/2024   Chronic pain of right knee 08/07/2024   Pre-diabetes 04/03/2024   Adenomatous polyp of colon 06/22/2023   History of colonic polyps 06/05/2023   IBS (irritable bowel syndrome) 04/06/2023   Depression, recurrent 08/04/2022   GAD (generalized anxiety disorder) 08/04/2022   History of hysterectomy 04/05/2022   History of thyroid  cancer 01/03/2022   RLS (restless legs syndrome) 08/13/2020   Circadian rhythm sleep disorder, 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   Hyperlipidemia 07/20/2020   Eczema    OSA (obstructive sleep apnea)    Essential hypertension 08/22/2018   Hypothyroidism 08/22/2018   Morbid obesity (HCC) 08/22/2018    PCP: Severa Rock HERO, FNP  REFERRING PROVIDER: Severa Rock HERO, FNP  REFERRING DIAG: [phone] (ICD-10-CM) - Chronic bilateral low back pain with left-sided sciatica M25.561,G89.29 (ICD-10-CM) - Chronic pain of right knee  Rationale for Evaluation and Treatment: Rehabilitation  THERAPY DIAG:  Muscle weakness (generalized)  Abnormal posture  Other low back pain  Chronic pain of right knee  Unspecified lack of coordination  ONSET DATE: Chronic back pain but exacerbated 2-3 weeks; knee pain worsening since 2019  SUBJECTIVE:  SUBJECTIVE STATEMENT: Pt states knee is feeling a little sore today compared to the back. Sat in a hard dining chair for a long period yesterday -- back felt sore with this.    PERTINENT HISTORY:  Chronic back pain, scoliosis, high blood pressure, prediabetes, palpitations  PAIN:  Are you having pain? Yes: NPRS scale: at rest 2 or 3, at worst 4 within this past week, has been as high as a 9 while at work Pain location: L>R lumbosacral junction Pain description: burning dull pain; radiate front of L thigh Aggravating factors:  mopping/sweeping, long prolonged standing (I.e. doing dishes) Relieving factors: Tylenol, ibuprofen , heat  Yes: NPRS scale: 0 at rest, at worst 3  Pain location: R knee Pain description: sharp Aggravating factors: Standing or twisting on knee Relieving factors: Tylenol, ibuprofen , heat  PRECAUTIONS: None  RED FLAGS: None   WEIGHT BEARING RESTRICTIONS: No  FALLS:  Has patient fallen in last 6 months? Yes. Number of falls 2 -- last fall was off back steps  LIVING ENVIRONMENT: Lives with: mom and granddaughter Lives in: House/apartment Stairs: 3 steps to get in through car port, 2 steps in front of the house; level Has following equipment at home: None  OCCUPATION: Retired  PLOF: Independent  PATIENT GOALS: Decrease pain, less difficulty with work activities, improve movement, return to recreational activities, improve strength, stand and sit longer, less difficulty with home activities  NEXT MD VISIT: PRN  OBJECTIVE:  Note: Objective measures were completed at Evaluation unless otherwise noted.  DIAGNOSTIC FINDINGS:  12/17/23 X-ray shows narrowing of the lateral compartment of both knees some narrowing of the medial compartment left knee some narrowing patellofemoral compartment right knee   Impression osteoarthritis right knee primarily lateral compartment with more narrowing than seen in October 2019   Overall alignment shows valgus of the right knee slight increase  PATIENT SURVEYS:  Modified Oswestry:  MODIFIED OSWESTRY DISABILITY SCALE  Date: 08/25/24 Score  Pain intensity 3 =  Pain medication provides me with moderate relief from pain.  2. Personal care (washing, dressing, etc.) 0 =  I can take care of myself normally without causing increased pain.  3. Lifting 3 = Pain prevents me from lifting heavy weights, but I can manage light to medium weights if they are conveniently positioned  4. Walking 1 = Pain prevents me from walking more than 1 mile.  5. Sitting 2 =   Pain prevents me from sitting more than 1 hour.  6. Standing 2 =  Pain prevents me from standing more than 1 hour  7. Sleeping 0 = Pain does not prevent me from sleeping well.  8. Social Life 2 = Pain prevents me from participating in more energetic activities (eg. sports, dancing).  9. Traveling 1 =  I can travel anywhere, but it increases my pain.  10. Employment/ Homemaking 2 = I can perform most of my homemaking/job duties, but pain prevents me from performing more physically stressful activities (eg, lifting, vacuuming).  Total 16/50   Interpretation of scores: Score Category Description  0-20% Minimal Disability The patient can cope with most living activities. Usually no treatment is indicated apart from advice on lifting, sitting and exercise  21-40% Moderate Disability The patient experiences more pain and difficulty with sitting, lifting and standing. Travel and social life are more difficult and they may be disabled from work. Personal care, sexual activity and sleeping are not grossly affected, and the patient can usually be managed by conservative means  41-60% Severe  Disability Pain remains the main problem in this group, but activities of daily living are affected. These patients require a detailed investigation  61-80% Crippled Back pain impinges on all aspects of the patient's life. Positive intervention is required  81-100% Bed-bound These patients are either bed-bound or exaggerating their symptoms  Bluford FORBES Zoe DELENA Karon DELENA, et al. Surgery versus conservative management of stable thoracolumbar fracture: the PRESTO feasibility RCT. [place] ([place]): [employer]; 2021 Nov. [facility] Technology Assessment, No. 25.62.) Appendix 3, Oswestry Disability Index category descriptors. Available from: [email or web address].cz  Minimally Clinically Important Difference (MCID) = 12.8%  COGNITION: Overall cognitive status: Within functional limits for tasks  assessed     SENSATION: N/T L ant thigh occasionally Can get N/T along lateral 2 toes  MUSCLE LENGTH: Hamstrings: Right ~70 deg; Left ~50 deg Thomas test: did not assess  POSTURE: No Significant postural limitations  PALPATION: TTP L SIJ, and R glute med  LUMBAR ROM:   AROM eval  Flexion 80%  Extension 100% pain  Right lateral flexion 100%  Left lateral flexion 100% slight pain  Right rotation 100%  Left rotation 100% slight pain   (Blank rows = not tested)  LOWER EXTREMITY ROM:     Active  Right eval Left eval  Hip flexion    Hip extension    Hip abduction    Hip adduction    Hip internal rotation    Hip external rotation    Knee flexion    Knee extension    Ankle dorsiflexion    Ankle plantarflexion    Ankle inversion    Ankle eversion     (Blank rows = not tested)  LOWER EXTREMITY MMT:    MMT Right eval Left eval  Hip flexion 4 4  Hip extension 3+ 3+  Hip abduction 3+ 3+ pain  Hip adduction    Hip internal rotation    Hip external rotation    Knee flexion 4 slight pain in back 4 slight pain in back  Knee extension 5 5  Ankle dorsiflexion    Ankle plantarflexion    Ankle inversion    Ankle eversion     (Blank rows = not tested)  LUMBAR SPECIAL TESTS:  Straight leg raise test: Positive and FABER test: Positive Supine to long sit: R LE lengthens, L LE shortens  FUNCTIONAL TESTS:  Did not assess  GAIT: Distance walked: Into clinic Assistive device utilized: None Level of assistance: Complete Independence Comments: Mildly antalgic  TREATMENT DATE: 09/01/24 Nustep L1 x 12 min UEs/LEs Supine SLR x10, with 2# x10 Supine clamshell green TB 2x10 Supine PPT x10 with marching green TB x10 Supine bridge with ball squeeze 2x10 Sidelying clamshell 2x10 Standing palloff press blue TB 2x10   PATIENT EDUCATION:  Education details: Exam findings, POC Person educated: Patient Education method: Explanation Education comprehension: verbalized  understanding  HOME EXERCISE PROGRAM: Access Code: [email or web address] URL: [email or web address] Date: 09/01/2024 Prepared by: Lynzy Rawles April Earnie Starring  Exercises - Supine Bridge with [employer] Between Knees  - 1 x daily - 7 x weekly - 2 sets - 10 reps - Supine Active Straight Leg Raise  - 1 x daily - 7 x weekly - 2 sets - 10 reps - Clamshell  - 1 x daily - 7 x weekly - 2 sets - 10 reps - Supine March with Posterior Pelvic Tilt  - 1 x daily - 7 x weekly - 2 sets - 10 reps - Standing Anti-Rotation Press with Anchored  Resistance  - 1 x daily - 7 x weekly - 2 sets - 10 reps  ASSESSMENT:  CLINICAL IMPRESSION: Worked on core, quad, and hip strengthening this session with good pt tolerance. L SIJ appears to be in alignment today -- no tenderness to palpation.    OBJECTIVE IMPAIRMENTS: Abnormal gait, decreased activity tolerance, decreased balance, decreased coordination, decreased endurance, decreased mobility, difficulty walking, decreased ROM, decreased strength, hypomobility, increased fascial restrictions, increased muscle spasms, impaired flexibility, impaired sensation, improper body mechanics, postural dysfunction, and pain.   ACTIVITY LIMITATIONS: lifting, bending, standing, squatting, stairs, transfers, and locomotion level  PARTICIPATION LIMITATIONS: meal prep, cleaning, laundry, shopping, and community activity  PERSONAL FACTORS: Age, Fitness, Past/current experiences, and Time since onset of injury/illness/exacerbation are also affecting patient's functional outcome.   REHAB POTENTIAL: Good  CLINICAL DECISION MAKING: Evolving/moderate complexity  EVALUATION COMPLEXITY: Moderate   GOALS: Goals reviewed with patient? Yes  SHORT TERM GOALS: Target date: 09/22/2024   Pt will be ind with initial HEP Baseline: Goal status: INITIAL  2.  Pt will report improved back pain by >/= 50% Baseline:  Goal status: INITIAL  3.  Pt will report improved knee pain by  >/=25% Baseline:  Goal status: INITIAL    LONG TERM GOALS: Target date: 10/20/2024   Pt will be ind with management and progression of HEP Baseline:  Goal status: INITIAL  2.  Pt will be able to stand and do her dishes without having to lean on the counter  Baseline:  Goal status: INITIAL  3.  Pt will report improved modified Oswestry to </=5/50 to demo MCID Baseline:  Goal status: INITIAL  4.  Pt will be able to mop and sweep with tolerable pain levels Baseline:  Goal status: INITIAL  5.  Pt will report improved knee pain by >/=50% Baseline:  Goal status: INITIAL  6.  Pt will report improved back pain by >/=75% Baseline:  Goal status: INITIAL  PLAN:  PT FREQUENCY: 2x/week  PT DURATION: 8 weeks  PLANNED INTERVENTIONS: 97164- PT Re-evaluation, 97750- Physical Performance Testing, 97110-Therapeutic exercises, 97530- Therapeutic activity, [id number]- Neuromuscular re-education, 97535- Self Care, [id number]- Manual therapy, 97116- Gait training, [id number] (1-2 muscles), 20561 (3+ muscles)- Dry Needling, Patient/Family education, Balance training, Stair training, Joint mobilization, and Spinal mobilization  NO VASO/COLD PACK,E-STIM,TRACTION, U/S OR IONTO PER [facility] BLUE   PLAN FOR NEXT SESSION: Check inominate rotation and correct as indicated. Work on estate manager/land agent with sweeping/mopping. Strengthen bilat LEs and core.    Breckin Zafar April Ma L Cythia Bachtel, PT, DPT 09/01/2024, 1:50 PM

## 2024-09-02 DIAGNOSIS — M25561 Pain in right knee: Secondary | ICD-10-CM | POA: Insufficient documentation

## 2024-09-03 ENCOUNTER — Encounter: Payer: Self-pay | Admitting: Physical Therapy

## 2024-09-03 ENCOUNTER — Ambulatory Visit: Admitting: Physical Therapy

## 2024-09-03 DIAGNOSIS — R293 Abnormal posture: Secondary | ICD-10-CM

## 2024-09-03 DIAGNOSIS — G8929 Other chronic pain: Secondary | ICD-10-CM

## 2024-09-03 DIAGNOSIS — M6281 Muscle weakness (generalized): Secondary | ICD-10-CM | POA: Diagnosis not present

## 2024-09-03 DIAGNOSIS — M5459 Other low back pain: Secondary | ICD-10-CM

## 2024-09-03 DIAGNOSIS — R279 Unspecified lack of coordination: Secondary | ICD-10-CM

## 2024-09-03 NOTE — Therapy (Signed)
 OUTPATIENT PHYSICAL THERAPY TREATMENT   Patient Name: Margaret Schmitt MRN: [id number] DOB:04-12-1961, 63 y.o., female Today's Date: 09/03/2024  END OF SESSION:  PT End of Session - 09/03/24 1354     Visit Number 3    Number of Visits 16    Date for Recertification  10/20/24    Authorization Type [employer]    PT Start Time 1345    PT Stop Time 1425    PT Time Calculation (min) 40 min    Activity Tolerance Patient tolerated treatment well           Past Medical History:  Diagnosis Date   Asthma    Eczema    History of diverticulitis    Hypertension    Hypertension    Hypothyroidism    IBS (irritable bowel syndrome)    diagnose when living in [place]   Morbid obesity (HCC)    OSA (obstructive sleep apnea)    Thyroid  cancer (HCC)    Thyroid  disease    hypothyroidism   Past Surgical History:  Procedure Laterality Date   ABDOMINAL HYSTERECTOMY     ADENOIDECTOMY     APPENDECTOMY     BIOPSY  06/22/2023   Procedure: BIOPSY;  Surgeon: Cinderella Deatrice FALCON, MD;  [facility];  Service: Endoscopy;;   CHOLECYSTECTOMY     COLONOSCOPY     around 2016. removed polyps.pt thinks she  was to repeat in 5 years. tcs done in [place] .   COLONOSCOPY WITH PROPOFOL  N/A 06/22/2023   Procedure: COLONOSCOPY WITH PROPOFOL ;  Surgeon: Cinderella Deatrice FALCON, MD;  [facility];  Service: Endoscopy;  Laterality: N/A;  1:15pm;asa 3   POLYPECTOMY  06/22/2023   Procedure: POLYPECTOMY;  Surgeon: Cinderella Deatrice FALCON, MD;  [facility];  Service: Endoscopy;;   SUBMUCOSAL LIFTING INJECTION  06/22/2023   Procedure: SUBMUCOSAL LIFTING INJECTION;  Surgeon: Cinderella Deatrice FALCON, MD;  [facility];  Service: Endoscopy;;   THYROID  LOBECTOMY Left    TONSILLECTOMY     URETERAL REIMPLANTION Left    VAGINAL PROLAPSE REPAIR     used cadaver tissue   Patient Active Problem List   Diagnosis Date Noted   Chronic bilateral low back pain with left-sided sciatica 08/07/2024    Chronic pain of right knee 08/07/2024   Pre-diabetes 04/03/2024   Adenomatous polyp of colon 06/22/2023   History of colonic polyps 06/05/2023   IBS (irritable bowel syndrome) 04/06/2023   Depression, recurrent 08/04/2022   GAD (generalized anxiety disorder) 08/04/2022   History of hysterectomy 04/05/2022   History of thyroid  cancer 01/03/2022   RLS (restless legs syndrome) 08/13/2020   Circadian rhythm sleep disorder, 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   Hyperlipidemia 07/20/2020   Eczema    OSA (obstructive sleep apnea)    Essential hypertension 08/22/2018   Hypothyroidism 08/22/2018   Morbid obesity (HCC) 08/22/2018    PCP: Severa Rock HERO, FNP  REFERRING PROVIDER: Severa Rock HERO, FNP  REFERRING DIAG: [phone] (ICD-10-CM) - Chronic bilateral low back pain with left-sided sciatica M25.561,G89.29 (ICD-10-CM) - Chronic pain of right knee  Rationale for Evaluation and Treatment: Rehabilitation  THERAPY DIAG:  Muscle weakness (generalized)  Abnormal posture  Other low back pain  Chronic pain of right knee  Unspecified lack of coordination  ONSET DATE: Chronic back pain but exacerbated 2-3 weeks; knee pain worsening since 2019  SUBJECTIVE:  SUBJECTIVE STATEMENT: Pt states she felt a little nauseous after last treatment session. Reports this can occur at times. Knee is a little sore. States she's been having some heart palpitations today so feeling a little more fatigued. Saw ortho yesterday and started on prednisone.   PERTINENT HISTORY:  Chronic back pain, scoliosis, high blood pressure, prediabetes, palpitations  PAIN:  Are you having pain? Yes: NPRS scale: at rest 2 or 3, at worst 4 within this past week, has been as high as a 9 while at work Pain location: L>R lumbosacral junction Pain  description: burning dull pain; radiate front of L thigh Aggravating factors: mopping/sweeping, long prolonged standing (I.e. doing dishes) Relieving factors: Tylenol, ibuprofen , heat  Yes: NPRS scale: 0 at rest, at worst 3  Pain location: R knee Pain description: sharp Aggravating factors: Standing or twisting on knee Relieving factors: Tylenol, ibuprofen , heat  PRECAUTIONS: None  RED FLAGS: None   WEIGHT BEARING RESTRICTIONS: No  FALLS:  Has patient fallen in last 6 months? Yes. Number of falls 2 -- last fall was off back steps  LIVING ENVIRONMENT: Lives with: mom and granddaughter Lives in: House/apartment Stairs: 3 steps to get in through car port, 2 steps in front of the house; level Has following equipment at home: None  OCCUPATION: Retired  PLOF: Independent  PATIENT GOALS: Decrease pain, less difficulty with work activities, improve movement, return to recreational activities, improve strength, stand and sit longer, less difficulty with home activities  NEXT MD VISIT: PRN  OBJECTIVE:  Note: Objective measures were completed at Evaluation unless otherwise noted.  DIAGNOSTIC FINDINGS:  Recent 09/02/24 Imaging: The x-rays of her right knee shows obvious lateral compartment arthritis. X-rays of her back show severe lumbar scoliosis. She also on the lateral view has about a grade 1 slip at L4-L5. Next on the x-rays, she has marked degenerative disk disease at L5-S1.  Impression:  1. Same as the x-rays.  2. Questionable foraminal stenosis involving that femoral nerve on the left.   PATIENT SURVEYS:  Modified Oswestry:  MODIFIED OSWESTRY DISABILITY SCALE  Date: 08/25/24 Score  Pain intensity 3 =  Pain medication provides me with moderate relief from pain.  2. Personal care (washing, dressing, etc.) 0 =  I can take care of myself normally without causing increased pain.  3. Lifting 3 = Pain prevents me from lifting heavy weights, but I can manage light to medium  weights if they are conveniently positioned  4. Walking 1 = Pain prevents me from walking more than 1 mile.  5. Sitting 2 =  Pain prevents me from sitting more than 1 hour.  6. Standing 2 =  Pain prevents me from standing more than 1 hour  7. Sleeping 0 = Pain does not prevent me from sleeping well.  8. Social Life 2 = Pain prevents me from participating in more energetic activities (eg. sports, dancing).  9. Traveling 1 =  I can travel anywhere, but it increases my pain.  10. Employment/ Homemaking 2 = I can perform most of my homemaking/job duties, but pain prevents me from performing more physically stressful activities (eg, lifting, vacuuming).  Total 16/50   Interpretation of scores: Score Category Description  0-20% Minimal Disability The patient can cope with most living activities. Usually no treatment is indicated apart from advice on lifting, sitting and exercise  21-40% Moderate Disability The patient experiences more pain and difficulty with sitting, lifting and standing. Travel and social life are more difficult and they may  be disabled from work. Personal care, sexual activity and sleeping are not grossly affected, and the patient can usually be managed by conservative means  41-60% Severe Disability Pain remains the main problem in this group, but activities of daily living are affected. These patients require a detailed investigation  61-80% Crippled Back pain impinges on all aspects of the patient's life. Positive intervention is required  81-100% Bed-bound These patients are either bed-bound or exaggerating their symptoms  Bluford FORBES Zoe DELENA Karon DELENA, et al. Surgery versus conservative management of stable thoracolumbar fracture: the PRESTO feasibility RCT. [place] ([place]): [employer]; 2021 Nov. [facility] Technology Assessment, No. 25.62.) Appendix 3, Oswestry Disability Index category descriptors. Available from:  Findjewelers.cz  Minimally Clinically Important Difference (MCID) = 12.8%  COGNITION: Overall cognitive status: Within functional limits for tasks assessed     SENSATION: N/T L ant thigh occasionally Can get N/T along lateral 2 toes  MUSCLE LENGTH: Hamstrings: Right ~70 deg; Left ~50 deg Thomas test: did not assess  POSTURE: No Significant postural limitations  PALPATION: TTP L SIJ, and R glute med  LUMBAR ROM:   AROM eval  Flexion 80%  Extension 100% pain  Right lateral flexion 100%  Left lateral flexion 100% slight pain  Right rotation 100%  Left rotation 100% slight pain   (Blank rows = not tested)  LOWER EXTREMITY ROM:     Active  Right eval Left eval  Hip flexion    Hip extension    Hip abduction    Hip adduction    Hip internal rotation    Hip external rotation    Knee flexion    Knee extension    Ankle dorsiflexion    Ankle plantarflexion    Ankle inversion    Ankle eversion     (Blank rows = not tested)  LOWER EXTREMITY MMT:    MMT Right eval Left eval  Hip flexion 4 4  Hip extension 3+ 3+  Hip abduction 3+ 3+ pain  Hip adduction    Hip internal rotation    Hip external rotation    Knee flexion 4 slight pain in back 4 slight pain in back  Knee extension 5 5  Ankle dorsiflexion    Ankle plantarflexion    Ankle inversion    Ankle eversion     (Blank rows = not tested)  LUMBAR SPECIAL TESTS:  Straight leg raise test: Positive and FABER test: Positive Supine to long sit: R LE lengthens, L LE shortens  FUNCTIONAL TESTS:  Did not assess  GAIT: Distance walked: Into clinic Assistive device utilized: None Level of assistance: Complete Independence Comments: Mildly antalgic  TREATMENT DATE: 09/03/24 Nustep L1 x 12 min UEs/LEs Supine SAQ 2# 2x10 Supine PPT 2x10 with diaphragmatic breaths 2x10 Supine ab set with push down of UEs 2x10 Supine hip abd 2# 2x10 Manual therapy: STM & TPR glute med/max  bilat   PATIENT EDUCATION:  Education details: Exam findings, POC Person educated: Patient Education method: Explanation Education comprehension: verbalized understanding  HOME EXERCISE PROGRAM: Access Code: [email or web address] URL: [email or web address] Date: 09/01/2024 Prepared by: Rudra Hobbins April Marie Oveda Dadamo  Exercises - Supine Bridge with [employer] Between Knees  - 1 x daily - 7 x weekly - 2 sets - 10 reps - Supine Active Straight Leg Raise  - 1 x daily - 7 x weekly - 2 sets - 10 reps - Clamshell  - 1 x daily - 7 x weekly - 2 sets - 10 reps - Supine  March with Posterior Pelvic Tilt  - 1 x daily - 7 x weekly - 2 sets - 10 reps - Standing Anti-Rotation Press with Anchored Resistance  - 1 x daily - 7 x weekly - 2 sets - 10 reps  ASSESSMENT:  CLINICAL IMPRESSION: Worked on core, quad, and hip strengthening this session within pt tolerance. Deferred progressing her strengthening and resistance as pt did have some increased headache after last treatment session.    OBJECTIVE IMPAIRMENTS: Abnormal gait, decreased activity tolerance, decreased balance, decreased coordination, decreased endurance, decreased mobility, difficulty walking, decreased ROM, decreased strength, hypomobility, increased fascial restrictions, increased muscle spasms, impaired flexibility, impaired sensation, improper body mechanics, postural dysfunction, and pain.   ACTIVITY LIMITATIONS: lifting, bending, standing, squatting, stairs, transfers, and locomotion level  PARTICIPATION LIMITATIONS: meal prep, cleaning, laundry, shopping, and community activity  PERSONAL FACTORS: Age, Fitness, Past/current experiences, and Time since onset of injury/illness/exacerbation are also affecting patient's functional outcome.   REHAB POTENTIAL: Good  CLINICAL DECISION MAKING: Evolving/moderate complexity  EVALUATION COMPLEXITY: Moderate   GOALS: Goals reviewed with patient? Yes  SHORT TERM GOALS: Target date:  09/22/2024   Pt will be ind with initial HEP Baseline: Goal status: INITIAL  2.  Pt will report improved back pain by >/= 50% Baseline:  Goal status: INITIAL  3.  Pt will report improved knee pain by >/=25% Baseline:  Goal status: INITIAL    LONG TERM GOALS: Target date: 10/20/2024   Pt will be ind with management and progression of HEP Baseline:  Goal status: INITIAL  2.  Pt will be able to stand and do her dishes without having to lean on the counter  Baseline:  Goal status: INITIAL  3.  Pt will report improved modified Oswestry to </=5/50 to demo MCID Baseline:  Goal status: INITIAL  4.  Pt will be able to mop and sweep with tolerable pain levels Baseline:  Goal status: INITIAL  5.  Pt will report improved knee pain by >/=50% Baseline:  Goal status: INITIAL  6.  Pt will report improved back pain by >/=75% Baseline:  Goal status: INITIAL  PLAN:  PT FREQUENCY: 2x/week  PT DURATION: 8 weeks  PLANNED INTERVENTIONS: 97164- PT Re-evaluation, 97750- Physical Performance Testing, 97110-Therapeutic exercises, 97530- Therapeutic activity, [id number]- Neuromuscular re-education, 97535- Self Care, [id number]- Manual therapy, 97116- Gait training, [id number] (1-2 muscles), 20561 (3+ muscles)- Dry Needling, Patient/Family education, Balance training, Stair training, Joint mobilization, and Spinal mobilization  NO VASO/COLD PACK,E-STIM,TRACTION, U/S OR IONTO PER [facility] BL[facility]   PLAN FOR NEXT SESSION: Work on chief strategy officer with sweeping/mopping. Strengthen bilat LEs and core.    Breasia Karges April Ma L Kioni Stahl, PT, DPT 09/03/2024, 1:54 PM

## 2024-09-04 ENCOUNTER — Emergency Department (HOSPITAL_COMMUNITY)
Admission: EM | Admit: 2024-09-04 | Discharge: 2024-09-05 | Disposition: A | Discharge status: Home / Self Care | Attending: Emergency Medicine | Admitting: Emergency Medicine

## 2024-09-04 ENCOUNTER — Encounter (HOSPITAL_COMMUNITY): Payer: Self-pay | Admitting: Emergency Medicine

## 2024-09-04 ENCOUNTER — Emergency Department (HOSPITAL_COMMUNITY)

## 2024-09-04 ENCOUNTER — Encounter: Payer: Self-pay | Admitting: Family Medicine

## 2024-09-04 DIAGNOSIS — R519 Headache, unspecified: Secondary | ICD-10-CM | POA: Diagnosis present

## 2024-09-04 DIAGNOSIS — E039 Hypothyroidism, unspecified: Secondary | ICD-10-CM | POA: Insufficient documentation

## 2024-09-04 DIAGNOSIS — I160 Hypertensive urgency: Secondary | ICD-10-CM | POA: Insufficient documentation

## 2024-09-04 DIAGNOSIS — I10 Essential (primary) hypertension: Secondary | ICD-10-CM | POA: Diagnosis not present

## 2024-09-04 DIAGNOSIS — Z79899 Other long term (current) drug therapy: Secondary | ICD-10-CM | POA: Diagnosis not present

## 2024-09-04 DIAGNOSIS — R002 Palpitations: Secondary | ICD-10-CM | POA: Diagnosis not present

## 2024-09-04 DIAGNOSIS — J45909 Unspecified asthma, uncomplicated: Secondary | ICD-10-CM | POA: Diagnosis not present

## 2024-09-04 LAB — URINALYSIS, ROUTINE W REFLEX MICROSCOPIC
Bacteria, UA: NONE SEEN
Bilirubin Urine: NEGATIVE
Glucose, UA: NEGATIVE mg/dL
Hgb urine dipstick: NEGATIVE
Ketones, ur: NEGATIVE mg/dL
Nitrite: NEGATIVE
Protein, ur: NEGATIVE mg/dL
Specific Gravity, Urine: 1.008 (ref 1.005–1.030)
pH: 6 (ref 5.0–8.0)

## 2024-09-04 LAB — BASIC METABOLIC PANEL WITH GFR
Anion gap: 16 — ABNORMAL HIGH (ref 5–15)
BUN: 22 mg/dL (ref 8–23)
CO2: 20 mmol/L — ABNORMAL LOW (ref 22–32)
Calcium: 9.5 mg/dL (ref 8.9–10.3)
Chloride: 103 mmol/L (ref 98–111)
Creatinine, Ser: 0.99 mg/dL (ref 0.44–1.00)
GFR, Estimated: >60 mL/min (ref >60)
Glucose, Bld: 117 mg/dL — ABNORMAL HIGH (ref 70–99)
Potassium: 4.8 mmol/L (ref 3.5–5.1)
Sodium: 139 mmol/L (ref 135–145)

## 2024-09-04 LAB — TROPONIN T, HIGH SENSITIVITY
Troponin T High Sensitivity: <15 ng/L (ref 0–19)
Troponin T High Sensitivity: <15 ng/L (ref 0–19)

## 2024-09-04 LAB — URINE DRUG SCREEN
Amphetamines: NEGATIVE
Barbiturates: NEGATIVE
Benzodiazepines: NEGATIVE
Cocaine: NEGATIVE
Fentanyl: NEGATIVE
Methadone Scn, Ur: NEGATIVE
Opiates: NEGATIVE
Tetrahydrocannabinol: NEGATIVE

## 2024-09-04 LAB — TSH: TSH: 2.82 u[IU]/mL (ref 0.350–4.500)

## 2024-09-04 LAB — CBC
HCT: 43.3 % (ref 36.0–46.0)
Hemoglobin: 13.9 g/dL (ref 12.0–15.0)
MCH: 26.5 pg (ref 26.0–34.0)
MCHC: 32.1 g/dL (ref 30.0–36.0)
MCV: 82.6 fL (ref 80.0–100.0)
Platelets: 274 K/uL (ref 150–400)
RBC: 5.24 MIL/uL — ABNORMAL HIGH (ref 3.87–5.11)
RDW: 14.9 % (ref 11.5–15.5)
WBC: 16.2 K/uL — ABNORMAL HIGH (ref 4.0–10.5)
nRBC: 0 % (ref 0.0–0.2)

## 2024-09-04 LAB — ETHANOL: Alcohol, Ethyl (B): <15 mg/dL (ref <15)

## 2024-09-04 MED ORDER — METOPROLOL TARTRATE 25 MG PO TABS: 12.5000 mg | ORAL_TABLET | Freq: Two times a day (BID) | ORAL | 6 refills | Status: AC

## 2024-09-04 MED ORDER — LACTATED RINGERS IV BOLUS
1000.0000 mL | Freq: Once | INTRAVENOUS | Status: AC
  Administered 2024-09-04: 1000 mL via INTRAVENOUS

## 2024-09-04 MED ORDER — ACETAMINOPHEN 500 MG PO TABS
1000.0000 mg | ORAL_TABLET | Freq: Once | ORAL | Status: AC
  Administered 2024-09-04: 1000 mg via ORAL
  Filled 2024-09-04: qty 2

## 2024-09-04 MED ORDER — METOPROLOL TARTRATE 25 MG PO TABS
12.5000 mg | ORAL_TABLET | Freq: Once | ORAL | Status: AC
  Administered 2024-09-04: 12.5 mg via ORAL
  Filled 2024-09-04: qty 1

## 2024-09-04 MED ORDER — PROCHLORPERAZINE EDISYLATE 10 MG/2ML IJ SOLN
10.0000 mg | Freq: Once | INTRAMUSCULAR | Status: AC
  Administered 2024-09-04: 10 mg via INTRAVENOUS
  Filled 2024-09-04: qty 2

## 2024-09-04 MED ORDER — HYDRALAZINE HCL 20 MG/ML IJ SOLN
10.0000 mg | Freq: Once | INTRAMUSCULAR | Status: AC
  Administered 2024-09-04: 10 mg via INTRAVENOUS
  Filled 2024-09-04: qty 1

## 2024-09-04 MED ORDER — DIPHENHYDRAMINE HCL 50 MG/ML IJ SOLN
25.0000 mg | Freq: Once | INTRAMUSCULAR | Status: AC
  Administered 2024-09-04: 25 mg via INTRAVENOUS
  Filled 2024-09-04: qty 1

## 2024-09-04 MED ORDER — IBUPROFEN 400 MG PO TABS
600.0000 mg | ORAL_TABLET | Freq: Once | ORAL | Status: AC
  Administered 2024-09-04: 600 mg via ORAL
  Filled 2024-09-04: qty 2

## 2024-09-04 NOTE — ED Notes (Signed)
 Patient transported to CT

## 2024-09-04 NOTE — Progress Notes (Signed)
 Tobacco Use: Low Risk  (09/04/2024)   Patient History   . Smoking Tobacco Use: Never   . Smokeless Tobacco Use: Never   . Passive Exposure: Never  Recent Concern: Tobacco Use - Medium Risk (09/03/2024)   Received from [facility]   Patient History   . Smoking Tobacco Use: Former   . Smokeless Tobacco Use: Never   . Passive Exposure: Past

## 2024-09-04 NOTE — Discharge Instructions (Signed)
 Thank you for coming to [facility] Emergency Department. You were seen for palpitations, high blood pressure, headache. We did an exam, labs, and imaging, and these showed no acute findings.  We refilled your metoprolol .  Please take 12.5 mg twice per day as originally prescribed.  Please follow-up with your cardiologist within 1 to 2 weeks.  You can alternate Tylenol and ibuprofen  for any headache symptoms.  Please do not drink any caffeine.  Please stay well-hydrated at home.  Do not hesitate to return to the ED or call 911 if you experience: -Worsening symptoms -Chest pain -Worsening headache, visual changes, asymmetric numbness weakness or tingling -Lightheadedness, passing out -Fevers/chills -Anything else that concerns you

## 2024-09-04 NOTE — Progress Notes (Signed)
 Assessment and Plan:   Assessment & Plan Chest pain, unspecified type Due to intermittent palpitations, shortness of breath, headache, intermittent jaw pain, and chest pain advised patient to present to the emergency department via EMS due to dizziness on exam.  Patient is agreeable to this patient placed on 2 L of supplemental oxygen and left hand 20-gauge placed for IV access. Orders: .  ECG 12 Lead  Dizziness Patient experienced intermittent dizziness during exam during mild position changes.    Palpitations While in exam room patient reported she could feel her heart racing and then slowing down to a normal rate and then racing again.         The following portions of the patient's history were reviewed and updated as appropriate: allergies, current medications, past family history, past medical history, past social history, past surgical history and problem list.   Subjective:   Patient ID: Margaret Schmitt is a 63 y.o. female Chief Complaint  Patient presents with  . Palpitations    1 Week ago and then again today  . Headache    1 week now  . Jaw Pain    Lower left jaw that come and left  . Shortness of Breath  . Chest Pain    [place]winges--saw orthopedic yesterday and he said she had a lot of gas    Patient presents with intermittent palpitations for about a week, jaw pain, chest pain, shortness of breath and headache.  Patient is hypertensive on today's exam she reports she does take her blood pressure medication daily and has not missed any doses.  She is post to take metoprolol  for palpitations but she is out of it and she reports even while taking metoprolol  she did not like it controlled her palpitations.  Denies cardiac event in the past.  Reports she has had an echocardiogram previously that showed a mild amount of regurgitation.  She is not on a blood thinner.  Palpitations  This is a new problem. The current episode started in the past 7 days. The problem occurs  intermittently. On average, each episode lasts 5 minutes. The symptoms are aggravated by unknown. Associated symptoms include chest pain, dizziness and shortness of breath. She has tried beta blockers for the symptoms. Risk factors include dyslipidemia, family history and obesity.  Shortness of Breath This is a new problem. The current episode started in the past 7 days. The problem occurs intermittently. Associated symptoms include chest pain and headaches. Pertinent negatives include no abdominal pain, leg swelling, rash or wheezing.  Chest Pain  This is a new problem. The current episode started yesterday. The problem occurs intermittently. The problem has been waxing and waning. The pain is present in the substernal region. The pain is moderate. The quality of the pain is described as pressure and tightness. Radiates to: c/o left jaw pain that does not radiate from chest. Associated symptoms include dizziness, headaches and shortness of breath. Pertinent negatives include no abdominal pain.  Her past medical history is significant for hyperlipidemia and hypertension.  Her family medical history is significant for heart disease.  Dizziness This is a new problem. The current episode started in the past 7 days. The problem occurs intermittently. The problem has been waxing and waning. Associated symptoms include chest pain and headaches. Pertinent negatives include no abdominal pain or rash. The symptoms are aggravated by exertion.    ROS: Negative unless otherwise noted in HPI.  Objective:   Vital Signs:  BP 188/110 (BP Site: L  Arm, BP Position: Sitting, BP Cuff Size: X-Large)   Pulse 102   Temp 37.1 C (98.8 F) (Tympanic)   Resp 20   Ht 170.2 cm (5' 7)   Wt (!) 125.2 kg (276 lb)   SpO2 99%   BMI 43.23 kg/m   Body mass index is 43.23 kg/m. No LMP recorded. Patient has had a hysterectomy.  No results found for this visit on 09/04/24.   Physical Exam Vitals and nursing note  reviewed.  Constitutional:      General: She is not in acute distress.    Appearance: Normal appearance. She is normal weight. She is not ill-appearing or toxic-appearing.  HENT:     Head: Normocephalic.     Right Ear: Tympanic membrane, ear canal and external ear normal.     Left Ear: Tympanic membrane, ear canal and external ear normal.     Nose: Nose normal.     Mouth/Throat:     Mouth: Mucous membranes are moist.     Pharynx: Oropharynx is clear.  Eyes:     Extraocular Movements: Extraocular movements intact.     Conjunctiva/sclera: Conjunctivae normal.     Pupils: Pupils are equal, round, and reactive to light.  Cardiovascular:     Rate and Rhythm: Regular rhythm. Tachycardia present.     Heart sounds: Normal heart sounds. No murmur heard. Pulmonary:     Effort: Pulmonary effort is normal. No respiratory distress.     Breath sounds: Normal breath sounds. No stridor. No wheezing, rhonchi or rales.  Chest:     Chest wall: No tenderness.  Abdominal:     General: Abdomen is flat.     Palpations: Abdomen is soft.  Musculoskeletal:        General: Normal range of motion.     Cervical back: Normal range of motion.     Right lower leg: No edema.     Left lower leg: No edema.  Skin:    General: Skin is warm and dry.  Neurological:     General: No focal deficit present.     Mental Status: She is alert and oriented to person, place, and time. Mental status is at baseline.  Psychiatric:        Mood and Affect: Mood normal.        Behavior: Behavior normal.        Thought Content: Thought content normal.        Judgment: Judgment normal.      Follow-up as Needed , Follow-up with PCP, and Transferred to ED Patient Disposition  Indication for Disposition: Requires a higher level of care  Chief Complaint: Chest pain, shortness of breath, jaw pain, dizziness, and headache.   Margaret Schmitt given instructions to proceed to ED via ambulance; Location: [place] due to Requires  a higher level of care, testing, and/or treatment not able to be provided at Urgent Care   Report called to:EMS on arrival      The use of abridge was used to help in the completion of this note. Patient was educated and verbalized consent of its use.   Disposition Upon Discharge:  1.  Routine symptom specific, illness specific and/or disease specific instructions were discussed with the patient and/or caregiver at length.  2.  The differential diagnosis for the patient's specific symptoms were reviewed and discussed with the patient/caregiver at length; the patient/caregiver expressed understanding of all discussed and their relevant questions were all satisfactorily answered.  3.  Return to care  should the presenting symptoms recur, persist, or worsen in any way; or in the alternative, if new symptoms or complaints develop.  4.  The patient and any family present were given verbal and/or written discharge instructions as clinically indicated and appropriate.  5.  Continue OTC medications as needed and tolerated for control of the currently reported symptoms, if no conflict with the currently prescribed medications.

## 2024-09-04 NOTE — ED Provider Notes (Signed)
 [place] [facility] AT [facility] Provider Note   CSN: [id number] Arrival date & time: 09/04/24  1859     History {Add pertinent medical, surgical, social history, OB history to HPI:1} Chief Complaint  Patient presents with   Palpitations   Hypertension    Anglea Gordner is a 63 y.o. female with PMH as listed below who presents with heart palpitations. States she has presented before for palp but it has gotten worse this week. Has been on metoprolol  from cardiologist which hasn't helped. Also c/o headache and high blood pressure. Started PT one week ago for her back and her knee, and felt like the headache started after PT. Lasted x 1 week. Had nausea in the beginning but not anymore. Mostly located on left side of head. Has had headache like this before, but doesn't get them frequently.   Last saw cardiology 08/08/2023. Thought maybe MAT on ER EKG from that time period. Recommended metoprolol , limiting caffeine. Also noted some PVCs over 10 years ago.   Drinks 1-2 coffees per day, typically 1 soda per day. No alcohol, no other drugs. No recent changes to thyroid  medicine.   Ran out of her metoprolol  several days ago, didn't refill it bc she didn't feel it wasn't helping her.    Past Medical History:  Diagnosis Date   Asthma    Eczema    History of diverticulitis    Hypertension    Hypertension    Hypothyroidism    IBS (irritable bowel syndrome)    diagnose when living in [place]   Morbid obesity (HCC)    OSA (obstructive sleep apnea)    Thyroid  cancer (HCC)    Thyroid  disease    hypothyroidism       Home Medications Prior to Admission medications   Medication Sig Start Date End Date Taking? Authorizing Provider  acetaminophen (TYLENOL) 500 MG tablet Take 1,000 mg by mouth every 6 (six) hours as needed for mild pain (pain score 1-3).   Yes [provider]  cholecalciferol (VITAMIN D3) 25 MCG (1000 UNIT) tablet Take 1,000 Units by mouth  daily.   Yes [provider]  furosemide  (LASIX ) 20 MG tablet TAKE 1 TABLET (20 MG TOTAL) BY MOUTH AS NEEDED (FOR SWELLING). Patient taking differently: Take 20 mg by mouth as needed (swelling). 12/27/22 09/04/24 Yes BranchDorn FALCON, MD  ibuprofen  (ADVIL ) 200 MG tablet Take 400 mg by mouth every 6 (six) hours as needed for mild pain (pain score 1-3).   Yes [provider]  KLOR-CON  M20 20 MEQ tablet TAKE 1 TABLET (20 MEQ TOTAL) BY MOUTH AS NEEDED. ONLY WHEN TAKING LASIX  20 MG. Patient taking differently: Take 20 mEq by mouth daily as needed (when taking Lasix ). 12/27/22  Yes Branch, Dorn FALCON, MD  levothyroxine  (SYNTHROID ) 200 MCG tablet TAKE 1 TABLET (200 MCG TOTAL) BY MOUTH DAILY BEFORE BREAKFAST. 06/02/24  Yes Rakes, Rock HERO, FNP  losartan  (COZAAR ) 100 MG tablet Take 1 tablet (100 mg total) by mouth daily. 04/03/24  Yes Rakes, Rock HERO, FNP  metoprolol  tartrate (LOPRESSOR ) 25 MG tablet Take 0.5 tablets (12.5 mg total) by mouth 2 (two) times daily. (May take an additional 12.5 as needed for palpitations) 08/08/23  Yes Branch, Dorn FALCON, MD  predniSONE (DELTASONE) 10 MG tablet Take 10 mg by mouth 3 (three) times daily with meals. 09/03/24 09/24/24 Yes [provider]  triamcinolone  (NASACORT ) 55 MCG/ACT AERO nasal inhaler Place 2 sprays into the nose daily as needed (congestion,  seasonal allergies).   Yes [provider]      Allergies    Erythromycin    Review of Systems   Review of Systems A 10 point review of systems was performed and is negative unless otherwise reported in HPI.  Physical Exam Updated Vital Signs BP (!) 163/108   Pulse 98   Temp 98.8 F (37.1 C)   Resp (!) 23   SpO2 98%  Physical Exam General: Normal appearing female, lying in bed.  HEENT: PERRLA, Sclera anicteric, MMM, trachea midline.  Cardiology: RRR, no murmurs/rubs/gallops. BL radial and DP pulses equal bilaterally.  Resp: Normal respiratory rate and effort. CTAB, no wheezes,  rhonchi, crackles.  Abd: Soft, non-tender, non-distended. No rebound tenderness or guarding.  GU: Deferred. MSK: No peripheral edema or signs of trauma. Extremities without deformity or TTP. No cyanosis or clubbing. Skin: warm, dry. No rashes or lesions. Back: No CVA tenderness Neuro: A&Ox4, CNs II-XII grossly intact. MAEs. Sensation grossly intact.  Psych: Normal mood and affect.   ED Results / Procedures / Treatments   Labs (all labs ordered are listed, but only abnormal results are displayed) Labs Reviewed  CBC - Abnormal; Notable for the following components:      Result Value   WBC 16.2 (*)    RBC 5.24 (*)    All other components within normal limits  TSH  ETHANOL  BASIC METABOLIC PANEL WITH GFR  T4, FREE  URINE DRUG SCREEN  URINALYSIS, ROUTINE W REFLEX MICROSCOPIC  TROPONIN T, HIGH SENSITIVITY  TROPONIN T, HIGH SENSITIVITY    EKG EKG Interpretation Date/Time:  Thursday September 04 2024 19:28:07 EST Ventricular Rate:  119 PR Interval:  170 QRS Duration:  94 QT Interval:  320 QTC Calculation: 450 R Axis:   72  Text Interpretation: Sinus tachycardia Nonspecific ST abnormality  Confirmed by Franklyn Gills [phone]) on 09/04/2024 9:17:53 PM  Radiology CT Head Wo Contrast Result Date: 09/04/2024 CLINICAL DATA:  Palpitation with headache and high blood pressure. EXAM: CT HEAD WITHOUT CONTRAST TECHNIQUE: Contiguous axial images were obtained from the base of the skull through the vertex without intravenous contrast. RADIATION DOSE REDUCTION: This exam was performed according to the departmental dose-optimization program which includes automated exposure control, adjustment of the mA and/or kV according to patient size and/or use of iterative reconstruction technique. COMPARISON:  None Available. FINDINGS: Brain: No evidence of acute infarction, hemorrhage, hydrocephalus, extra-axial collection or mass lesion/mass effect. Vascular: No hyperdense vessel or unexpected calcification.  Skull: Normal. Negative for fracture or focal lesion. Sinuses/Orbits: No acute finding. Other: None. IMPRESSION: No acute intracranial pathology. Electronically Signed   By: Suzen Dials M.D.   On: 09/04/2024 22:19   DG Chest 2 View Result Date: 09/04/2024 CLINICAL DATA:  Palpitations and elevated blood pressure. EXAM: CHEST - 2 VIEW COMPARISON:  June 20, 2022 FINDINGS: The heart size and mediastinal contours are within normal limits. Mild linear atelectasis is seen within the left lung base. No focal consolidation, pleural effusion or pneumothorax is identified. Radiopaque surgical clips are present within the right upper quadrant. The visualized skeletal structures are unremarkable. IMPRESSION: Mild left basilar linear atelectasis. Electronically Signed   By: Suzen Dials M.D.   On: 09/04/2024 19:38    Procedures Procedures  {Document cardiac monitor, telemetry assessment procedure when appropriate:1}  Medications Ordered in ED Medications  hydrALAZINE (APRESOLINE) injection 10 mg (10 mg Intravenous Given 09/04/24 2133)  prochlorperazine (COMPAZINE) injection 10 mg (10 mg Intravenous Given 09/04/24 2130)  diphenhydrAMINE (BENADRYL) injection  25 mg (25 mg Intravenous Given 09/04/24 2128)  lactated ringers  bolus 1,000 mL (1,000 mLs Intravenous New Bag/Given 09/04/24 2151)    ED Course/ Medical Decision Making/ A&P                          Medical Decision Making Amount and/or Complexity of Data Reviewed Labs: ordered. Decision-making details documented in ED Course. Radiology: ordered. Decision-making details documented in ED Course.  Risk OTC drugs. Prescription drug management.    This patient presents to the ED for concern of ***, this involves an extensive number of treatment options, and is a complaint that carries with it a high risk of complications and morbidity.  I considered the following differential and admission for this acute, potentially life threatening  condition.   MDM:    The differential diagnosis for this patient's palpitations includes but is not limited to:  Arrhythmias including narrow and wide-complex tachycardias, AV blocks, PACs/PVCs, sick sinus syndrome, wandering atrial pacemaker.  Consider nonarrhythmic cardiac causes such as ACS, cardiomyopathy, congenital heart disease, congestive heart failure, pacemaker complication, pericarditis/myocarditis, or valvular disease.  Consider medication side effects or drug effect such as alcohol, caffeine, drugs of abuse, or tobacco.  Also consider anemia, thyroid  abnormalities, electrolyte derangements, pulmonary embolism, dehydration, orthostasis.  Consider psychiatric causes such as anxiety, panic attack, somatic symptom disorder.  Patient is overall well appearing at this time. Blood pressure is 199/137 mmHg on patient's arrival to the ED.  She was given IV hydralazine 10 mg for her hypertension.  Because she has a significant headache, obtained a CT head which did not show any hydrocephalus or ICH.  She does not have any symptoms on history  Patient does not have any symptoms on history nor signs on physical exam concerning for end organ damage secondary to hypertension.   Specifically, based up the patient's presentation, the patient is at sufficiently low risk for: -ACS given no CP, no SOB, normal cardio-pulmonary exam -SAH/stroke given no hx of acute stroke like sxs and normal neurologic exam. HA with neg CTH and improved w/ meds ***.  -end organ renal disease given no hematuria or AKI***  The patient was counseled regarding the deleterious effects of hypertension and the necessity of follow up with the patient's primary physician to establish a care plan.  Instructed to f/u with PCP within 1 week. Will be discharged with discharge instructions/return precautions.   Clinical Course as of 09/07/24 2336  Thu Sep 04, 2024  2118 DG Chest 2 View Mild left basilar linear atelectasis. [HN]   2118 Troponin T High Sensitivity: <15 neg [HN]  2118 WBC(!): 16.2 +leukocytosis [HN]  2118 BP(!): 202/132 [HN]  2221 Alcohol, Ethyl (B): <15 [HN]  2224 CT Head Wo Contrast No acute intracranial pathology. [HN]    Clinical Course User Index [HN] Franklyn Sid SAILOR, MD    Labs: I Ordered, and personally interpreted labs.  The pertinent results include:  those listed above  Imaging Studies ordered: I ordered imaging studies including CTH, CXR I independently visualized and interpreted imaging. I agree with the radiologist interpretation  Additional history obtained from chart review.    Cardiac Monitoring: The patient was maintained on a cardiac monitor.  I personally viewed and interpreted the cardiac monitored which showed an underlying rhythm of: sinus tachycardia, then NSR  Reevaluation: After the interventions noted above, I reevaluated the patient and found that they have :improved  Social Determinants of Health: Lives independently  Disposition:  ***  Co morbidities that complicate the patient evaluation  Past Medical History:  Diagnosis Date   Asthma    Eczema    History of diverticulitis    Hypertension    Hypertension    Hypothyroidism    IBS (irritable bowel syndrome)    diagnose when living in [place]   Morbid obesity (HCC)    OSA (obstructive sleep apnea)    Thyroid  cancer (HCC)    Thyroid  disease    hypothyroidism     Medicines Meds ordered this encounter  Medications   hydrALAZINE (APRESOLINE) injection 10 mg   prochlorperazine (COMPAZINE) injection 10 mg   diphenhydrAMINE (BENADRYL) injection 25 mg   lactated ringers  bolus 1,000 mL    I have reviewed the patients home medicines and have made adjustments as needed  Problem List / ED Course: Problem List Items Addressed This Visit   None        {Document critical care time when appropriate:1} {Document review of labs and clinical decision tools ie heart score, Chads2Vasc2 etc:1}   {Document your independent review of radiology images, and any outside records:1} {Document your discussion with family members, caretakers, and with consultants:1} {Document social determinants of health affecting pt's care:1} {Document your decision making why or why not admission, treatments were needed:1}  This note was created using dictation software, which may contain spelling or grammatical errors.

## 2024-09-04 NOTE — ED Triage Notes (Signed)
 PT BIB EMS from Urgent care with complaints of palpitations x 1 week. Pt complains of headache and high blood pressure.   EMS vs 180/130 110 hr  99% ra  20g in left hand.

## 2024-09-05 LAB — T4, FREE: Free T4: 0.98 ng/dL (ref 0.61–1.12)

## 2024-09-09 ENCOUNTER — Ambulatory Visit

## 2024-09-09 DIAGNOSIS — M5459 Other low back pain: Secondary | ICD-10-CM

## 2024-09-09 DIAGNOSIS — R293 Abnormal posture: Secondary | ICD-10-CM

## 2024-09-09 DIAGNOSIS — G8929 Other chronic pain: Secondary | ICD-10-CM

## 2024-09-09 DIAGNOSIS — M6281 Muscle weakness (generalized): Secondary | ICD-10-CM | POA: Diagnosis not present

## 2024-09-09 NOTE — Therapy (Signed)
 OUTPATIENT PHYSICAL THERAPY TREATMENT   Patient Name: Margaret Schmitt MRN: [id number] DOB:10-05-1961, 63 y.o., female Today's Date: 09/09/2024  END OF SESSION:  PT End of Session - 09/09/24 1350     Visit Number 4    Number of Visits 16    Date for Recertification  10/20/24    Authorization Type [employer]    PT Start Time 1345    PT Stop Time 1430    PT Time Calculation (min) 45 min    Activity Tolerance Patient tolerated treatment well           Past Medical History:  Diagnosis Date   Asthma    Eczema    History of diverticulitis    Hypertension    Hypertension    Hypothyroidism    IBS (irritable bowel syndrome)    diagnose when living in [place]   Morbid obesity (HCC)    OSA (obstructive sleep apnea)    Thyroid  cancer (HCC)    Thyroid  disease    hypothyroidism   Past Surgical History:  Procedure Laterality Date   ABDOMINAL HYSTERECTOMY     ADENOIDECTOMY     APPENDECTOMY     BIOPSY  06/22/2023   Procedure: BIOPSY;  Surgeon: Cinderella Deatrice FALCON, MD;  [facility]: [facility];  Service: Endoscopy;;   CHOLECYSTECTOMY     COLONOSCOPY     around 2016. removed polyps.pt thinks she  was to repeat in 5 years. tcs done in [place] .   COLONOSCOPY WITH PROPOFOL  N/A 06/22/2023   Procedure: COLONOSCOPY WITH PROPOFOL ;  Surgeon: Cinderella Deatrice FALCON, MD;  Location: [facility];  Service: Endoscopy;  Laterality: N/A;  1:15pm;asa 3   POLYPECTOMY  06/22/2023   Procedure: POLYPECTOMY;  Surgeon: Cinderella Deatrice FALCON, MD;  [facility]: [facility];  Service: Endoscopy;;   SUBMUCOSAL LIFTING INJECTION  06/22/2023   Procedure: SUBMUCOSAL LIFTING INJECTION;  Surgeon: Cinderella Deatrice FALCON, MD;  Location: [facility];  Service: Endoscopy;;   THYROID  LOBECTOMY Left    TONSILLECTOMY     URETERAL REIMPLANTION Left    VAGINAL PROLAPSE REPAIR     used cadaver tissue   Patient Active Problem List   Diagnosis Date Noted   Chronic bilateral low back pain with left-sided sciatica 08/07/2024    Chronic pain of right knee 08/07/2024   Pre-diabetes 04/03/2024   Adenomatous polyp of colon 06/22/2023   History of colonic polyps 06/05/2023   IBS (irritable bowel syndrome) 04/06/2023   Depression, recurrent 08/04/2022   GAD (generalized anxiety disorder) 08/04/2022   History of hysterectomy 04/05/2022   History of thyroid  cancer 01/03/2022   RLS (restless legs syndrome) 08/13/2020   Circadian rhythm sleep disorder, 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   Hyperlipidemia 07/20/2020   Eczema    OSA (obstructive sleep apnea)    Essential hypertension 08/22/2018   Hypothyroidism 08/22/2018   Morbid obesity (HCC) 08/22/2018    PCP: Severa Rock HERO, FNP  REFERRING PROVIDER: Severa Rock HERO, FNP  REFERRING DIAG: [id number] (ICD-10-CM) - Chronic bilateral low back pain with left-sided sciatica M25.561,G89.29 (ICD-10-CM) - Chronic pain of right knee  Rationale for Evaluation and Treatment: Rehabilitation  THERAPY DIAG:  Muscle weakness (generalized)  Abnormal posture  Other low back pain  Chronic pain of right knee  ONSET DATE: Chronic back pain but exacerbated 2-3 weeks; knee pain worsening since 2019  SUBJECTIVE:  SUBJECTIVE STATEMENT: Pt states she felt a little nauseous after last treatment session. Reports this can occur at times. Knee is a little sore. States she's been having some heart palpitations today so feeling a little more fatigued. Saw ortho yesterday and started on prednisone.   PERTINENT HISTORY:  Chronic back pain, scoliosis, high blood pressure, prediabetes, palpitations  PAIN:  Are you having pain? Yes: NPRS scale: at rest 2 or 3, at worst 4 within this past week, has been as high as a 9 while at work Pain location: L>R lumbosacral junction Pain description: burning dull pain;  radiate front of L thigh Aggravating factors: mopping/sweeping, long prolonged standing (I.e. doing dishes) Relieving factors: Tylenol, ibuprofen , heat  Yes: NPRS scale: 0 at rest, at worst 3  Pain location: R knee Pain description: sharp Aggravating factors: Standing or twisting on knee Relieving factors: Tylenol, ibuprofen , heat  PRECAUTIONS: None  RED FLAGS: None   WEIGHT BEARING RESTRICTIONS: No  FALLS:  Has patient fallen in last 6 months? Yes. Number of falls 2 -- last fall was off back steps  LIVING ENVIRONMENT: Lives with: mom and granddaughter Lives in: House/apartment Stairs: 3 steps to get in through car port, 2 steps in front of the house; level Has following equipment at home: None  OCCUPATION: Retired  PLOF: Independent  PATIENT GOALS: Decrease pain, less difficulty with work activities, improve movement, return to recreational activities, improve strength, stand and sit longer, less difficulty with home activities  NEXT MD VISIT: PRN  OBJECTIVE:  Note: Objective measures were completed at Evaluation unless otherwise noted.  DIAGNOSTIC FINDINGS:  Recent 09/02/24 Imaging: The x-rays of her right knee shows obvious lateral compartment arthritis. X-rays of her back show severe lumbar scoliosis. She also on the lateral view has about a grade 1 slip at L4-L5. Next on the x-rays, she has marked degenerative disk disease at L5-S1.  Impression:  1. Same as the x-rays.  2. Questionable foraminal stenosis involving that femoral nerve on the left.   PATIENT SURVEYS:  Modified Oswestry:  MODIFIED OSWESTRY DISABILITY SCALE  Date: 08/25/24 Score  Pain intensity 3 =  Pain medication provides me with moderate relief from pain.  2. Personal care (washing, dressing, etc.) 0 =  I can take care of myself normally without causing increased pain.  3. Lifting 3 = Pain prevents me from lifting heavy weights, but I can manage light to medium weights if they are conveniently  positioned  4. Walking 1 = Pain prevents me from walking more than 1 mile.  5. Sitting 2 =  Pain prevents me from sitting more than 1 hour.  6. Standing 2 =  Pain prevents me from standing more than 1 hour  7. Sleeping 0 = Pain does not prevent me from sleeping well.  8. Social Life 2 = Pain prevents me from participating in more energetic activities (eg. sports, dancing).  9. Traveling 1 =  I can travel anywhere, but it increases my pain.  10. Employment/ Homemaking 2 = I can perform most of my homemaking/job duties, but pain prevents me from performing more physically stressful activities (eg, lifting, vacuuming).  Total 16/50   Interpretation of scores: Score Category Description  0-20% Minimal Disability The patient can cope with most living activities. Usually no treatment is indicated apart from advice on lifting, sitting and exercise  21-40% Moderate Disability The patient experiences more pain and difficulty with sitting, lifting and standing. Travel and social life are more difficult and they may  be disabled from work. Personal care, sexual activity and sleeping are not grossly affected, and the patient can usually be managed by conservative means  41-60% Severe Disability Pain remains the main problem in this group, but activities of daily living are affected. These patients require a detailed investigation  61-80% Crippled Back pain impinges on all aspects of the patient's life. Positive intervention is required  81-100% Bed-bound These patients are either bed-bound or exaggerating their symptoms  Bluford FORBES Zoe DELENA Karon DELENA, et al. Surgery versus conservative management of stable thoracolumbar fracture: the PRESTO feasibility RCT. [place] ([place]): [employer]; 2021 Nov. [facility] [employer] Assessment, No. 25.62.) Appendix 3, Oswestry Disability Index category descriptors. Available from: Findjewelers.cz  Minimally Clinically Important  Difference (MCID) = 12.8%  COGNITION: Overall cognitive status: Within functional limits for tasks assessed     SENSATION: N/T L ant thigh occasionally Can get N/T along lateral 2 toes  MUSCLE LENGTH: Hamstrings: Right ~70 deg; Left ~50 deg Thomas test: did not assess  POSTURE: No Significant postural limitations  PALPATION: TTP L SIJ, and R glute med  LUMBAR ROM:   AROM eval  Flexion 80%  Extension 100% pain  Right lateral flexion 100%  Left lateral flexion 100% slight pain  Right rotation 100%  Left rotation 100% slight pain   (Blank rows = not tested)  LOWER EXTREMITY ROM:     Active  Right eval Left eval  Hip flexion    Hip extension    Hip abduction    Hip adduction    Hip internal rotation    Hip external rotation    Knee flexion    Knee extension    Ankle dorsiflexion    Ankle plantarflexion    Ankle inversion    Ankle eversion     (Blank rows = not tested)  LOWER EXTREMITY MMT:    MMT Right eval Left eval  Hip flexion 4 4  Hip extension 3+ 3+  Hip abduction 3+ 3+ pain  Hip adduction    Hip internal rotation    Hip external rotation    Knee flexion 4 slight pain in back 4 slight pain in back  Knee extension 5 5  Ankle dorsiflexion    Ankle plantarflexion    Ankle inversion    Ankle eversion     (Blank rows = not tested)  LUMBAR SPECIAL TESTS:  Straight leg raise test: Positive and FABER test: Positive Supine to long sit: R LE lengthens, L LE shortens  FUNCTIONAL TESTS:  Did not assess  GAIT: Distance walked: Into clinic Assistive device utilized: None Level of assistance: Complete Independence Comments: Mildly antalgic  TREATMENT DATE:  09/09/24                                  EXERCISE LOG  Exercise Repetitions and Resistance Comments  Nustep  Lvl 2 x 15 mins   Rockerboard 4 mins   Lunges    Standing Hip Abduction 2# x 20 reps bil   Standing Hip Flexion 2# x 20 reps bil   Standing Hip Extension 2# x 20 reps bil   Ball  Roll Out 2 mins    Blank cell = exercise not performed today   Manual Therapy Soft Tissue Mobilization: Right low back, STW/M to right lumbar paraspinals and upper glute to decrease pain and tone with pt in left side-lying for comfort   09/03/24 Nustep L1 x  12 min UEs/LEs Supine SAQ 2# 2x10 Supine PPT 2x10 with diaphragmatic breaths 2x10 Supine ab set with push down of UEs 2x10 Supine hip abd 2# 2x10 Manual therapy: STM & TPR glute med/max bilat   PATIENT EDUCATION:  Education details: Exam findings, POC Person educated: Patient Education method: Explanation Education comprehension: verbalized understanding  HOME EXERCISE PROGRAM: Access Code: [email or web address] URL: [email or web address] Date: 09/01/2024 Prepared by: Gellen April Earnie Starring  Exercises - Supine Bridge with [employer] Between Knees  - 1 x daily - 7 x weekly - 2 sets - 10 reps - Supine Active Straight Leg Raise  - 1 x daily - 7 x weekly - 2 sets - 10 reps - Clamshell  - 1 x daily - 7 x weekly - 2 sets - 10 reps - Supine March with Posterior Pelvic Tilt  - 1 x daily - 7 x weekly - 2 sets - 10 reps - Standing Anti-Rotation Press with Anchored Resistance  - 1 x daily - 7 x weekly - 2 sets - 10 reps  ASSESSMENT:  CLINICAL IMPRESSION: Pt arrives for today's treatment session reporting 2/10 low back pain.  Pt states that she has been sweeping and vacuuming today as well as attended a funeral yesterday so she has been on her feet a lot over the past few days.  Pt introduced to several standing exercises today with good results.  Pt requiring min cues for proper technique, but demonstrates great posture throughout.  STW/M performed to right lumbar paraspinals and upper glut to decrease pain and tone.  Discussed importance of proper body mechanics with vacuuming and mopping with pt instructed to dance with the mop and remain within her BOS.  Pt verbalizing understanding.  Pt reported decreased pain at completion  of today's treatment session.  OBJECTIVE IMPAIRMENTS: Abnormal gait, decreased activity tolerance, decreased balance, decreased coordination, decreased endurance, decreased mobility, difficulty walking, decreased ROM, decreased strength, hypomobility, increased fascial restrictions, increased muscle spasms, impaired flexibility, impaired sensation, improper body mechanics, postural dysfunction, and pain.   ACTIVITY LIMITATIONS: lifting, bending, standing, squatting, stairs, transfers, and locomotion level  PARTICIPATION LIMITATIONS: meal prep, cleaning, laundry, shopping, and community activity  PERSONAL FACTORS: Age, Fitness, Past/current experiences, and Time since onset of injury/illness/exacerbation are also affecting patient's functional outcome.   REHAB POTENTIAL: Good  CLINICAL DECISION MAKING: Evolving/moderate complexity  EVALUATION COMPLEXITY: Moderate   GOALS: Goals reviewed with patient? Yes  SHORT TERM GOALS: Target date: 09/22/2024   Pt will be ind with initial HEP Baseline: Goal status: INITIAL  2.  Pt will report improved back pain by >/= 50% Baseline:  Goal status: INITIAL  3.  Pt will report improved knee pain by >/=25% Baseline:  Goal status: INITIAL    LONG TERM GOALS: Target date: 10/20/2024   Pt will be ind with management and progression of HEP Baseline:  Goal status: INITIAL  2.  Pt will be able to stand and do her dishes without having to lean on the counter  Baseline:  Goal status: INITIAL  3.  Pt will report improved modified Oswestry to </=5/50 to demo MCID Baseline:  Goal status: INITIAL  4.  Pt will be able to mop and sweep with tolerable pain levels Baseline:  Goal status: INITIAL  5.  Pt will report improved knee pain by >/=50% Baseline:  Goal status: INITIAL  6.  Pt will report improved back pain by >/=75% Baseline:  Goal status: INITIAL  PLAN:  PT FREQUENCY: 2x/week  PT DURATION: 8  weeks  PLANNED INTERVENTIONS:  97164- PT Re-evaluation, 97750- Physical Performance Testing, 97110-Therapeutic exercises, 97530- Therapeutic activity, [id number]- Neuromuscular re-education, 97535- Self Care, [id number]- Manual therapy, [phone]- Gait training, [phone] (1-2 muscles), [id number] (3+ muscles)- Dry Needling, Patient/Family education, Balance training, Stair training, Joint mobilization, and Spinal mobilization  NO VASO/COLD PACK,E-STIM,TRACTION, U/S OR IONTO PER [employer] [facility]   PLAN FOR NEXT SESSION: Work on chief strategy officer with sweeping/mopping. Strengthen bilat LEs and core.    Delon DELENA Gosling, PTA 09/09/2024, 3:20 PM

## 2024-09-11 ENCOUNTER — Encounter: Payer: Self-pay | Admitting: Family Medicine

## 2024-09-11 ENCOUNTER — Ambulatory Visit: Admitting: Family Medicine

## 2024-09-11 ENCOUNTER — Ambulatory Visit: Payer: Self-pay | Admitting: Family Medicine

## 2024-09-11 ENCOUNTER — Ambulatory Visit

## 2024-09-11 VITALS — BP 165/100 | HR 84 | Temp 97.0°F | Ht 67.0 in | Wt 280.8 lb

## 2024-09-11 DIAGNOSIS — Z85850 Personal history of malignant neoplasm of thyroid: Secondary | ICD-10-CM

## 2024-09-11 DIAGNOSIS — I10 Essential (primary) hypertension: Secondary | ICD-10-CM

## 2024-09-11 DIAGNOSIS — F339 Major depressive disorder, recurrent, unspecified: Secondary | ICD-10-CM | POA: Diagnosis not present

## 2024-09-11 DIAGNOSIS — R002 Palpitations: Secondary | ICD-10-CM

## 2024-09-11 DIAGNOSIS — E890 Postprocedural hypothyroidism: Secondary | ICD-10-CM

## 2024-09-11 DIAGNOSIS — R293 Abnormal posture: Secondary | ICD-10-CM

## 2024-09-11 DIAGNOSIS — M5459 Other low back pain: Secondary | ICD-10-CM

## 2024-09-11 DIAGNOSIS — R7303 Prediabetes: Secondary | ICD-10-CM

## 2024-09-11 DIAGNOSIS — M6281 Muscle weakness (generalized): Secondary | ICD-10-CM

## 2024-09-11 DIAGNOSIS — F411 Generalized anxiety disorder: Secondary | ICD-10-CM

## 2024-09-11 LAB — CBC WITH DIFFERENTIAL/PLATELET
Basophils Absolute: 0.1 x10E3/uL (ref 0.0–0.2)
Basos: 1 %
EOS (ABSOLUTE): 0.5 x10E3/uL — ABNORMAL HIGH (ref 0.0–0.4)
Eos: 5 %
Hematocrit: 41.1 % (ref 34.0–46.6)
Hemoglobin: 12.9 g/dL (ref 11.1–15.9)
Immature Grans (Abs): 0 x10E3/uL (ref 0.0–0.1)
Immature Granulocytes: 0 %
Lymphocytes Absolute: 2.8 x10E3/uL (ref 0.7–3.1)
Lymphs: 28 %
MCH: 26.4 pg — ABNORMAL LOW (ref 26.6–33.0)
MCHC: 31.4 g/dL — ABNORMAL LOW (ref 31.5–35.7)
MCV: 84 fL (ref 79–97)
Monocytes Absolute: 0.8 x10E3/uL (ref 0.1–0.9)
Monocytes: 7 %
Neutrophils Absolute: 6 x10E3/uL (ref 1.4–7.0)
Neutrophils: 59 %
Platelets: 287 x10E3/uL (ref 150–450)
RBC: 4.88 x10E6/uL (ref 3.77–5.28)
RDW: 15.6 % — ABNORMAL HIGH (ref 11.7–15.4)
WBC: 10.2 x10E3/uL (ref 3.4–10.8)

## 2024-09-11 LAB — CMP14+EGFR
ALT: 12 IU/L (ref 0–32)
AST: 15 IU/L (ref 0–40)
Albumin: 4.2 g/dL (ref 3.9–4.9)
Alkaline Phosphatase: 86 IU/L (ref 49–135)
BUN/Creatinine Ratio: 23 (ref 12–28)
BUN: 21 mg/dL (ref 8–27)
Bilirubin Total: 0.3 mg/dL (ref 0.0–1.2)
CO2: 23 mmol/L (ref 20–29)
Calcium: 9.3 mg/dL (ref 8.7–10.3)
Chloride: 107 mmol/L — ABNORMAL HIGH (ref 96–106)
Creatinine, Ser: 0.9 mg/dL (ref 0.57–1.00)
Globulin, Total: 2.4 g/dL (ref 1.5–4.5)
Glucose: 97 mg/dL (ref 70–99)
Potassium: 4.7 mmol/L (ref 3.5–5.2)
Sodium: 144 mmol/L (ref 134–144)
Total Protein: 6.6 g/dL (ref 6.0–8.5)
eGFR: 72 mL/min/1.73 (ref >59)

## 2024-09-11 LAB — LIPID PANEL
Chol/HDL Ratio: 3.3 ratio (ref 0.0–4.4)
Cholesterol, Total: 206 mg/dL — ABNORMAL HIGH (ref 100–199)
HDL: 63 mg/dL (ref >39)
LDL Chol Calc (NIH): 118 mg/dL — ABNORMAL HIGH (ref 0–99)
Triglycerides: 140 mg/dL (ref 0–149)
VLDL Cholesterol Cal: 25 mg/dL (ref 5–40)

## 2024-09-11 LAB — BAYER DCA HB A1C WAIVED: HB A1C (BAYER DCA - WAIVED): 5.9 % — ABNORMAL HIGH (ref 4.8–5.6)

## 2024-09-11 MED ORDER — LEVOTHYROXINE SODIUM 200 MCG PO TABS: 200.0000 ug | ORAL_TABLET | Freq: Every day | ORAL | 1 refills | Status: AC

## 2024-09-11 MED ORDER — LOSARTAN POTASSIUM 100 MG PO TABS: 100.0000 mg | ORAL_TABLET | Freq: Every day | ORAL | 1 refills | Status: AC

## 2024-09-11 NOTE — Progress Notes (Signed)
 Subjective:  Patient ID: Margaret Schmitt, female    DOB: [birth year], 63 y.o.   MRN: [id number]  Patient Care Team: Severa Rock HERO, FNP as PCP - General (Family Medicine) Alvan Dorn FALCON, MD as PCP - Cardiology (Cardiology)   Chief Complaint:  ER follow up  (11/05/2023 - 09/05/2024 (4 hours)/[facility]- palpitations ) and Medical Management of Chronic Issues   HPI: Margaret Schmitt is a 63 y.o. female presenting on 09/11/2024 for ER follow up  (11/05/2023 - 09/05/2024 (4 hours)/[facility] at [facility]- palpitations ) and Medical Management of Chronic Issues   Margaret Schmitt is a 63 year old female with hypertension and palpitations who presents with increased palpitations and anxiety.  Palpitations and associated symptoms - Increased frequency of palpitations over the past week and a half after running out of metoprolol  - Palpitations associated with shortness of breath, anxiety, and feelings of panic - No chest pain reported - Recent emergency room visit with reportedly normal cardiac workup - Small heart murmur and regurgitation noted in the past - Awaiting cardiology appointment scheduled for December 26th  Antihypertensive medication adherence and blood pressure control - Without metoprolol  for approximately one and a half weeks, believed to contribute to increased palpitations - Continues to take losartan  regularly, including a dose taken 30 minutes prior to current visit - Expresses concern about the effectiveness of metoprolol  for palpitations but acknowledges its role in blood pressure management - Experiences 'white coat syndrome' with elevated blood pressure readings during medical visits  Anxiety and self-management strategies - Anxiety and panic triggered by palpitations and shortness of breath - Uses Benadryl for anxiety, taking up to four tablets daily with perceived benefit - Switched to  caffeine-free soda to help manage symptoms - No increase in water intake  Recent laboratory and imaging findings - Recent emergency room visit revealed mildly elevated white blood cell count of 16.2 - Normal glucose and thyroid  function tests - History of atelectasis on previous chest x-rays          Relevant past medical, surgical, family, and social history reviewed and updated as indicated.  Allergies and medications reviewed and updated. Data reviewed: Chart in Epic.   Past Medical History:  Diagnosis Date   Asthma    Eczema    History of diverticulitis    Hypertension    Hypertension    Hypothyroidism    IBS (irritable bowel syndrome)    diagnose when living in [place]   Morbid obesity (HCC)    OSA (obstructive sleep apnea)    Thyroid  cancer (HCC)    Thyroid  disease    hypothyroidism    Past Surgical History:  Procedure Laterality Date   ABDOMINAL HYSTERECTOMY     ADENOIDECTOMY     APPENDECTOMY     BIOPSY  06/22/2023   Procedure: BIOPSY;  Surgeon: Cinderella Deatrice FALCON, MD;  Location: [facility];  Service: Endoscopy;;   CHOLECYSTECTOMY     COLONOSCOPY     around 2016. removed polyps.pt thinks she  was to repeat in 5 years. tcs done in [place] .   COLONOSCOPY WITH PROPOFOL  N/A 06/22/2023   Procedure: COLONOSCOPY WITH PROPOFOL ;  Surgeon: Cinderella Deatrice FALCON, MD;  Location: [facility];  Service: Endoscopy;  Laterality: N/A;  1:15pm;asa 3   POLYPECTOMY  06/22/2023   Procedure: POLYPECTOMY;  Surgeon: Cinderella Deatrice FALCON, MD;  Location: [facility];  Service: Endoscopy;;   SUBMUCOSAL  LIFTING INJECTION  06/22/2023   Procedure: SUBMUCOSAL LIFTING INJECTION;  Surgeon: Cinderella Deatrice FALCON, MD;  Location: [facility];  Service: Endoscopy;;   THYROID  LOBECTOMY Left    TONSILLECTOMY     URETERAL REIMPLANTION Left    VAGINAL PROLAPSE REPAIR     used cadaver tissue    Social History   Socioeconomic History   Marital status: Single    Spouse name: Not on file   Number of  children: Not on file   Years of education: Not on file   Highest education level: GED or equivalent  Occupational History   Not on file  Tobacco Use   Smoking status: Former    Types: Cigarettes    Start date: 08/26/2022    Passive exposure: Past   Smokeless tobacco: Never  Vaping Use   Vaping status: Never Used  Substance and Sexual Activity   Alcohol use: Yes    Comment: rarely - once a year.   Drug use: Never   Sexual activity: Not Currently  Other Topics Concern   Not on file  Social History Narrative   Not on file   Social Drivers of Health   Financial Resource Strain: Low Risk  (09/10/2024)   Overall Financial Resource Strain (CARDIA)    Difficulty of Paying Living Expenses: Not very hard  Food Insecurity: Food Insecurity Present (09/10/2024)   Hunger Vital Sign    Worried About Running Out of Food in the Last Year: Sometimes true    Ran Out of Food in the Last Year: Never true  Transportation Needs: No Transportation Needs (09/10/2024)   PRAPARE - Administrator, Civil Service (Medical): No    Lack of Transportation (Non-Medical): No  Physical Activity: Insufficiently Active (09/10/2024)   Exercise Vital Sign    Days of Exercise per Week: 2 days    Minutes of Exercise per Session: 30 min  Stress: Stress Concern Present (09/10/2024)   [employer] of Occupational Health - Occupational Stress Questionnaire    Feeling of Stress: Very much  Social Connections: Moderately Isolated (09/10/2024)   Social Connection and Isolation Panel    Frequency of Communication with Friends and Family: More than three times a week    Frequency of Social Gatherings with Friends and Family: Three times a week    Attends Religious Services: More than 4 times per year    Active Member of Clubs or Organizations: No    Attends Banker Meetings: Not on file    Marital Status: Divorced  Intimate Partner Violence: Not At Risk (05/08/2022)   Received from [facility]   Humiliation, Afraid, Rape, and Kick questionnaire    Within the last year, have you been afraid of your partner or ex-partner?: No    Within the last year, have you been humiliated or emotionally abused in other ways by your partner or ex-partner?: No    Within the last year, have you been kicked, hit, slapped, or otherwise physically hurt by your partner or ex-partner?: No    Within the last year, have you been raped or forced to have any kind of sexual activity by your partner or ex-partner?: No    Outpatient Encounter Medications as of 09/11/2024  Medication Sig   acetaminophen (TYLENOL) 500 MG tablet Take 1,000 mg by mouth every 6 (six) hours as needed for mild pain (pain score 1-3).   cholecalciferol (VITAMIN D3) 25 MCG (1000 UNIT) tablet Take 1,000 Units by mouth daily.  furosemide  (LASIX ) 20 MG tablet TAKE 1 TABLET (20 MG TOTAL) BY MOUTH AS NEEDED (FOR SWELLING). (Patient taking differently: Take 20 mg by mouth as needed (swelling).)   ibuprofen  (ADVIL ) 200 MG tablet Take 400 mg by mouth every 6 (six) hours as needed for mild pain (pain score 1-3).   KLOR-CON  M20 20 MEQ tablet TAKE 1 TABLET (20 MEQ TOTAL) BY MOUTH AS NEEDED. ONLY WHEN TAKING LASIX  20 MG. (Patient taking differently: Take 20 mEq by mouth daily as needed (when taking Lasix ).)   predniSONE (DELTASONE) 10 MG tablet Take 10 mg by mouth 3 (three) times daily with meals.   triamcinolone  (NASACORT ) 55 MCG/ACT AERO nasal inhaler Place 2 sprays into the nose daily as needed (congestion, seasonal allergies).   levothyroxine  (SYNTHROID ) 200 MCG tablet Take 1 tablet (200 mcg total) by mouth daily before breakfast.   losartan  (COZAAR ) 100 MG tablet Take 1 tablet (100 mg total) by mouth daily.   metoprolol  tartrate (LOPRESSOR ) 25 MG tablet Take 0.5 tablets (12.5 mg total) by mouth 2 (two) times daily. (May take an additional 12.5 as needed for palpitations) (Patient not taking: Reported on 09/11/2024)   [DISCONTINUED]  levothyroxine  (SYNTHROID ) 200 MCG tablet TAKE 1 TABLET (200 MCG TOTAL) BY MOUTH DAILY BEFORE BREAKFAST.   [DISCONTINUED] losartan  (COZAAR ) 100 MG tablet Take 1 tablet (100 mg total) by mouth daily.   No facility-administered encounter medications on file as of 09/11/2024.    Allergies  Allergen Reactions   Erythromycin Swelling    Facial swelling    Pertinent ROS per HPI, otherwise unremarkable      Objective:  BP (!) 165/100   Pulse 84   Temp (!) 97 F (36.1 C)   Ht 5' 7 (1.702 m)   Wt 280 lb 12.8 oz (127.4 kg)   SpO2 98%   BMI 43.98 kg/m    Wt Readings from Last 3 Encounters:  09/11/24 280 lb 12.8 oz (127.4 kg)  08/07/24 276 lb 9.6 oz (125.5 kg)  04/03/24 281 lb 3.2 oz (127.6 kg)    Physical Exam Vitals and nursing note reviewed.  Constitutional:      General: She is not in acute distress.    Appearance: Normal appearance. She is well-developed and well-groomed. She is morbidly obese. She is not ill-appearing, toxic-appearing or diaphoretic.  HENT:     Head: Normocephalic and atraumatic.     Jaw: There is normal jaw occlusion.     Right Ear: Hearing normal.     Left Ear: Hearing normal.     Nose: Nose normal.     Mouth/Throat:     Lips: Pink.     Mouth: Mucous membranes are moist.     Pharynx: Oropharynx is clear. Uvula midline.  Eyes:     General: Lids are normal.     Extraocular Movements: Extraocular movements intact.     Conjunctiva/sclera: Conjunctivae normal.     Pupils: Pupils are equal, round, and reactive to light.  Neck:     Thyroid : No thyroid  mass, thyromegaly or thyroid  tenderness.     Vascular: No carotid bruit or JVD.     Trachea: Trachea and phonation normal.  Cardiovascular:     Rate and Rhythm: Normal rate and regular rhythm.     Chest Wall: PMI is not displaced.     Pulses: Normal pulses.     Heart sounds: Normal heart sounds. No murmur heard.    No friction rub. No gallop.  Pulmonary:     Effort: Pulmonary  effort is normal. No  respiratory distress.     Breath sounds: Normal breath sounds. No wheezing.  Abdominal:     General: Bowel sounds are normal. There is no distension or abdominal bruit.     Palpations: Abdomen is soft. There is no hepatomegaly or splenomegaly.     Tenderness: There is no abdominal tenderness. There is no right CVA tenderness or left CVA tenderness.     Hernia: No hernia is present.  Musculoskeletal:        General: Normal range of motion.     Cervical back: Normal range of motion and neck supple.     Right lower leg: No edema.     Left lower leg: No edema.  Lymphadenopathy:     Cervical: No cervical adenopathy.  Skin:    General: Skin is warm and dry.     Capillary Refill: Capillary refill takes less than 2 seconds.     Coloration: Skin is not cyanotic, jaundiced or pale.     Findings: No rash.  Neurological:     General: No focal deficit present.     Mental Status: She is alert and oriented to person, place, and time.     Sensory: Sensation is intact.     Motor: Motor function is intact.     Coordination: Coordination is intact.     Gait: Gait is intact.     Deep Tendon Reflexes: Reflexes are normal and symmetric.  Psychiatric:        Attention and Perception: Attention and perception normal.        Mood and Affect: Mood is anxious. Affect is tearful.        Speech: Speech normal.        Behavior: Behavior normal. Behavior is cooperative.        Thought Content: Thought content normal.        Cognition and Memory: Cognition and memory normal.        Judgment: Judgment normal.         Results for orders placed or performed during the hospital encounter of 09/04/24  CBC   Collection Time: 09/04/24  7:49 PM  Result Value Ref Range   WBC 16.2 (H) 4.0 - 10.5 K/uL   RBC 5.24 (H) 3.87 - 5.11 MIL/uL   Hemoglobin 13.9 12.0 - 15.0 g/dL   HCT 56.6 63.9 - 53.9 %   MCV 82.6 80.0 - 100.0 fL   MCH 26.5 26.0 - 34.0 pg   MCHC 32.1 30.0 - 36.0 g/dL   RDW 85.0 88.4 - 84.4 %    Platelets 274 150 - 400 K/uL   nRBC 0.0 0.0 - 0.2 %  Troponin T, High Sensitivity   Collection Time: 09/04/24  7:49 PM  Result Value Ref Range   Troponin T High Sensitivity <15 0 - 19 ng/L  TSH   Collection Time: 09/04/24  9:25 PM  Result Value Ref Range   TSH 2.820 0.350 - 4.500 uIU/mL  T4, free   Collection Time: 09/04/24  9:25 PM  Result Value Ref Range   Free T4 0.98 0.61 - 1.12 ng/dL  Ethanol   Collection Time: 09/04/24  9:25 PM  Result Value Ref Range   Alcohol, Ethyl (B) <15 <15 mg/dL  Basic metabolic panel with GFR   Collection Time: 09/04/24  9:25 PM  Result Value Ref Range   Sodium 139 135 - 145 mmol/L   Potassium 4.8 3.5 - 5.1 mmol/L   Chloride 103 98 - 111  mmol/L   CO2 20 (L) 22 - 32 mmol/L   Glucose, Bld 117 (H) 70 - 99 mg/dL   BUN 22 8 - 23 mg/dL   Creatinine, Ser 9.00 0.44 - 1.00 mg/dL   Calcium 9.5 8.9 - 89.6 mg/dL   GFR, Estimated >39 >39 mL/min   Anion gap 16 (H) 5 - 15  Troponin T, High Sensitivity   Collection Time: 09/04/24  9:25 PM  Result Value Ref Range   Troponin T High Sensitivity <15 0 - 19 ng/L  Urine Drug Screen   Collection Time: 09/04/24  9:59 PM  Result Value Ref Range   Opiates NEGATIVE NEGATIVE   Cocaine NEGATIVE NEGATIVE   Benzodiazepines NEGATIVE NEGATIVE   Amphetamines NEGATIVE NEGATIVE   Tetrahydrocannabinol NEGATIVE NEGATIVE   Barbiturates NEGATIVE NEGATIVE   Methadone Scn, Ur NEGATIVE NEGATIVE   Fentanyl NEGATIVE NEGATIVE  Urinalysis, Routine w reflex microscopic -Urine, Clean Catch   Collection Time: 09/04/24  9:59 PM  Result Value Ref Range   Color, Urine STRAW (A) YELLOW   APPearance CLEAR CLEAR   Specific Gravity, Urine 1.008 1.005 - 1.030   pH 6.0 5.0 - 8.0   Glucose, UA NEGATIVE NEGATIVE mg/dL   Hgb urine dipstick NEGATIVE NEGATIVE   Bilirubin Urine NEGATIVE NEGATIVE   Ketones, ur NEGATIVE NEGATIVE mg/dL   Protein, ur NEGATIVE NEGATIVE mg/dL   Nitrite NEGATIVE NEGATIVE   Leukocytes,Ua TRACE (A) NEGATIVE   RBC /  HPF 0-5 0 - 5 RBC/hpf   WBC, UA 6-10 0 - 5 WBC/hpf   Bacteria, UA NONE SEEN NONE SEEN   Squamous Epithelial / HPF 0-5 0 - 5 /HPF   Mucus PRESENT    Hyaline Casts, UA PRESENT        Pertinent labs & imaging results that were available during my care of the patient were reviewed by me and considered in my medical decision making.  Assessment & Plan:  Raelee was seen today for er follow up  and medical management of chronic issues.  Diagnoses and all orders for this visit:  Essential hypertension -     losartan  (COZAAR ) 100 MG tablet; Take 1 tablet (100 mg total) by mouth daily. -     CMP14+EGFR -     CBC with Differential/Platelet -     Lipid panel -     Bayer DCA Hb A1c Waived  Postoperative hypothyroidism -     levothyroxine  (SYNTHROID ) 200 MCG tablet; Take 1 tablet (200 mcg total) by mouth daily before breakfast.  History of thyroid  cancer -     levothyroxine  (SYNTHROID ) 200 MCG tablet; Take 1 tablet (200 mcg total) by mouth daily before breakfast.  Pre-diabetes -     CMP14+EGFR -     CBC with Differential/Platelet -     Lipid panel -     Bayer DCA Hb A1c Waived  Palpitations -     CMP14+EGFR -     CBC with Differential/Platelet -     Lipid panel  Depression, recurrent -     CMP14+EGFR -     CBC with Differential/Platelet -     Lipid panel  GAD (generalized anxiety disorder) -     CMP14+EGFR -     CBC with Differential/Platelet -     Lipid panel        Palpitations and Essential Hypertension Palpitations increased in frequency and severity after running out of metoprolol  for about a week and a half. Blood pressure elevated, likely due to anxiety and  lack of metoprolol . Cardiologist appointment scheduled for December 26th. Metoprolol  helps with blood pressure control and calming extra heart pathways. Anxiety exacerbates palpitations. - Pick up metoprolol  from pharmacy today and resume medication. - Monitor blood pressure regularly. - Scheduled a two-week blood  pressure check with the nurse. - Continue losartan  as prescribed. - Increase water intake and avoid caffeine to help manage palpitations.  Generalized Anxiety Disorder Anxiety exacerbated by palpitations and fear of passing out. Benadryl used for anxiety management, with some relief. Anxiety contributes to elevated blood pressure and palpitations. - Continue using Benadryl as needed for anxiety, not exceeding six tablets per day.  Postprocedural Hypothyroidism Thyroid  function tests from last week were normal. Continues on levothyroxine  200 mcg daily. - Continue levothyroxine  200 mcg daily. - Repeated thyroid  function tests today.  Prediabetes Glucose levels were normal during recent emergency room visit. Monitoring A1c to ensure stability. - Ordered A1c test today.  Atelectasis/scarring of left lower lung Atelectasis noted on previous chest x-rays, likely due to past COVID-19 infection.         Continue all other maintenance medications.  Follow up plan: Return if symptoms worsen or fail to improve, for 2 week BP nurse, 6 months for CPE.   Continue healthy lifestyle choices, including diet (rich in fruits, vegetables, and lean proteins, and low in salt and simple carbohydrates) and exercise (at least 30 minutes of moderate physical activity daily).  Educational handout given for DASH, HTN  The above assessment and management plan was discussed with the patient. The patient verbalized understanding of and has agreed to the management plan. Patient is aware to call the clinic if they develop any new symptoms or if symptoms persist or worsen. Patient is aware when to return to the clinic for a follow-up visit. Patient educated on when it is appropriate to go to the emergency department.   Rosaline Bruns, FNP-C [facility] [phone]

## 2024-09-11 NOTE — Therapy (Signed)
 OUTPATIENT PHYSICAL THERAPY TREATMENT   Patient Name: Margaret Schmitt MRN: [id number] DOB:12-09-1960, 63 y.o., female Today's Date: 09/11/2024  END OF SESSION:  PT End of Session - 09/11/24 1349     Visit Number 5    Number of Visits 16    Date for Recertification  10/20/24    Authorization Type [employer]    PT Start Time 1347    PT Stop Time 1442    PT Time Calculation (min) 55 min    Activity Tolerance Patient tolerated treatment well           Past Medical History:  Diagnosis Date   Asthma    Eczema    History of diverticulitis    Hypertension    Hypertension    Hypothyroidism    IBS (irritable bowel syndrome)    diagnose when living in [place]   Morbid obesity (HCC)    OSA (obstructive sleep apnea)    Thyroid  cancer (HCC)    Thyroid  disease    hypothyroidism   Past Surgical History:  Procedure Laterality Date   ABDOMINAL HYSTERECTOMY     ADENOIDECTOMY     APPENDECTOMY     BIOPSY  06/22/2023   Procedure: BIOPSY;  Surgeon: Cinderella Deatrice FALCON, MD;  [facility]: [facility];  Service: Endoscopy;;   CHOLECYSTECTOMY     COLONOSCOPY     around 2016. removed polyps.pt thinks she  was to repeat in 5 years. tcs done in [place] .   COLONOSCOPY WITH PROPOFOL  N/A 06/22/2023   Procedure: COLONOSCOPY WITH PROPOFOL ;  Surgeon: Cinderella Deatrice FALCON, MD;  [facility]: [facility];  Service: Endoscopy;  Laterality: N/A;  1:15pm;asa 3   POLYPECTOMY  06/22/2023   Procedure: POLYPECTOMY;  Surgeon: Cinderella Deatrice FALCON, MD;  [facility]: [facility];  Service: Endoscopy;;   SUBMUCOSAL LIFTING INJECTION  06/22/2023   Procedure: SUBMUCOSAL LIFTING INJECTION;  Surgeon: Cinderella Deatrice FALCON, MD;  Location: [facility];  Service: Endoscopy;;   THYROID  LOBECTOMY Left    TONSILLECTOMY     URETERAL REIMPLANTION Left    VAGINAL PROLAPSE REPAIR     used cadaver tissue   Patient Active Problem List   Diagnosis Date Noted   Chronic bilateral low back pain with left-sided sciatica 08/07/2024    Chronic pain of right knee 08/07/2024   Pre-diabetes 04/03/2024   Adenomatous polyp of colon 06/22/2023   History of colonic polyps 06/05/2023   IBS (irritable bowel syndrome) 04/06/2023   Depression, recurrent 08/04/2022   GAD (generalized anxiety disorder) 08/04/2022   History of hysterectomy 04/05/2022   History of thyroid  cancer 01/03/2022   RLS (restless legs syndrome) 08/13/2020   Circadian rhythm sleep disorder, 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   Hyperlipidemia 07/20/2020   Eczema    OSA (obstructive sleep apnea)    Essential hypertension 08/22/2018   Hypothyroidism 08/22/2018   Morbid obesity (HCC) 08/22/2018    PCP: Severa Rock HERO, FNP  REFERRING PROVIDER: Severa Rock HERO, FNP  REFERRING DIAG: [phone] (ICD-10-CM) - Chronic bilateral low back pain with left-sided sciatica M25.561,G89.29 (ICD-10-CM) - Chronic pain of right knee  Rationale for Evaluation and Treatment: Rehabilitation  THERAPY DIAG:  Muscle weakness (generalized)  Abnormal posture  Other low back pain  ONSET DATE: Chronic back pain but exacerbated 2-3 weeks; knee pain worsening since 2019  SUBJECTIVE:  SUBJECTIVE STATEMENT: Pt reports 2/10 left low back pain today.   PERTINENT HISTORY:  Chronic back pain, scoliosis, high blood pressure, prediabetes, palpitations  PAIN:  Are you having pain? Yes: NPRS scale: 2/10 Pain location: L>R lumbosacral junction Pain description: burning dull pain; radiate front of L thigh Aggravating factors: mopping/sweeping, long prolonged standing (I.e. doing dishes) Relieving factors: Tylenol, ibuprofen , heat  Yes: NPRS scale: 0 at rest, at worst 3  Pain location: R knee Pain description: sharp Aggravating factors: Standing or twisting on knee Relieving factors: Tylenol, ibuprofen ,  heat  PRECAUTIONS: None  RED FLAGS: None   WEIGHT BEARING RESTRICTIONS: No  FALLS:  Has patient fallen in last 6 months? Yes. Number of falls 2 -- last fall was off back steps  LIVING ENVIRONMENT: Lives with: mom and granddaughter Lives in: House/apartment Stairs: 3 steps to get in through car port, 2 steps in front of the house; level Has following equipment at home: None  OCCUPATION: Retired  PLOF: Independent  PATIENT GOALS: Decrease pain, less difficulty with work activities, improve movement, return to recreational activities, improve strength, stand and sit longer, less difficulty with home activities  NEXT MD VISIT: PRN  OBJECTIVE:  Note: Objective measures were completed at Evaluation unless otherwise noted.  DIAGNOSTIC FINDINGS:  Recent 09/02/24 Imaging: The x-rays of her right knee shows obvious lateral compartment arthritis. X-rays of her back show severe lumbar scoliosis. She also on the lateral view has about a grade 1 slip at L4-L5. Next on the x-rays, she has marked degenerative disk disease at L5-S1.  Impression:  1. Same as the x-rays.  2. Questionable foraminal stenosis involving that femoral nerve on the left.   PATIENT SURVEYS:  Modified Oswestry:  MODIFIED OSWESTRY DISABILITY SCALE  Date: 08/25/24 Score  Pain intensity 3 =  Pain medication provides me with moderate relief from pain.  2. Personal care (washing, dressing, etc.) 0 =  I can take care of myself normally without causing increased pain.  3. Lifting 3 = Pain prevents me from lifting heavy weights, but I can manage light to medium weights if they are conveniently positioned  4. Walking 1 = Pain prevents me from walking more than 1 mile.  5. Sitting 2 =  Pain prevents me from sitting more than 1 hour.  6. Standing 2 =  Pain prevents me from standing more than 1 hour  7. Sleeping 0 = Pain does not prevent me from sleeping well.  8. Social Life 2 = Pain prevents me from participating in more  energetic activities (eg. sports, dancing).  9. Traveling 1 =  I can travel anywhere, but it increases my pain.  10. Employment/ Homemaking 2 = I can perform most of my homemaking/job duties, but pain prevents me from performing more physically stressful activities (eg, lifting, vacuuming).  Total 16/50   Interpretation of scores: Score Category Description  0-20% Minimal Disability The patient can cope with most living activities. Usually no treatment is indicated apart from advice on lifting, sitting and exercise  21-40% Moderate Disability The patient experiences more pain and difficulty with sitting, lifting and standing. Travel and social life are more difficult and they may be disabled from work. Personal care, sexual activity and sleeping are not grossly affected, and the patient can usually be managed by conservative means  41-60% Severe Disability Pain remains the main problem in this group, but activities of daily living are affected. These patients require a detailed investigation  61-80% Crippled Back pain impinges on  all aspects of the patient's life. Positive intervention is required  81-100% Bed-bound These patients are either bed-bound or exaggerating their symptoms  Bluford FORBES Zoe DELENA Karon DELENA, et al. Surgery versus conservative management of stable thoracolumbar fracture: the PRESTO feasibility RCT. [place] ([place]): [employer]; 2021 Nov. [facility] Technology Assessment, No. 25.62.) Appendix 3, Oswestry Disability Index category descriptors. Available from: [email or web address].cz  Minimally Clinically Important Difference (MCID) = 12.8%  COGNITION: Overall cognitive status: Within functional limits for tasks assessed     SENSATION: N/T L ant thigh occasionally Can get N/T along lateral 2 toes  MUSCLE LENGTH: Hamstrings: Right ~70 deg; Left ~50 deg Thomas test: did not assess  POSTURE: No Significant postural  limitations  PALPATION: TTP L SIJ, and R glute med  LUMBAR ROM:   AROM eval  Flexion 80%  Extension 100% pain  Right lateral flexion 100%  Left lateral flexion 100% slight pain  Right rotation 100%  Left rotation 100% slight pain   (Blank rows = not tested)  LOWER EXTREMITY ROM:     Active  Right eval Left eval  Hip flexion    Hip extension    Hip abduction    Hip adduction    Hip internal rotation    Hip external rotation    Knee flexion    Knee extension    Ankle dorsiflexion    Ankle plantarflexion    Ankle inversion    Ankle eversion     (Blank rows = not tested)  LOWER EXTREMITY MMT:    MMT Right eval Left eval  Hip flexion 4 4  Hip extension 3+ 3+  Hip abduction 3+ 3+ pain  Hip adduction    Hip internal rotation    Hip external rotation    Knee flexion 4 slight pain in back 4 slight pain in back  Knee extension 5 5  Ankle dorsiflexion    Ankle plantarflexion    Ankle inversion    Ankle eversion     (Blank rows = not tested)  LUMBAR SPECIAL TESTS:  Straight leg raise test: Positive and FABER test: Positive Supine to long sit: R LE lengthens, L LE shortens  FUNCTIONAL TESTS:  Did not assess  GAIT: Distance walked: Into clinic Assistive device utilized: None Level of assistance: Complete Independence Comments: Mildly antalgic  TREATMENT DATE:  09/11/24                                  EXERCISE LOG  Exercise Repetitions and Resistance Comments  Nustep  Lvl 2 x 15 mins   Rockerboard 4 mins   Lunges 14 box x 1 mins each   Standing Hip Abduction 2# x 20 reps bil   Standing Hip Flexion 2# x 20 reps bil   Standing Hip Extension 2# x 20 reps bil   Emcor     Blank cell = exercise not performed today   Manual Therapy Soft Tissue Mobilization: Right low back and left hip, STW/M to right lumbar paraspinals and upper glute as well as left lateral hip musculature to decrease pain and tone with pt in left side-lying for comfort    09/03/24 Nustep L1 x 12 min UEs/LEs Supine SAQ 2# 2x10 Supine PPT 2x10 with diaphragmatic breaths 2x10 Supine ab set with push down of UEs 2x10 Supine hip abd 2# 2x10 Manual therapy: STM & TPR glute med/max bilat   PATIENT EDUCATION:  Education  details: Exam findings, POC Person educated: Patient Education method: Explanation Education comprehension: verbalized understanding  HOME EXERCISE PROGRAM: Access Code: [email or web address] URL: [email or web address] Date: 09/01/2024 Prepared by: Gellen April Earnie Starring  Exercises - Supine Bridge with [employer] Between Knees  - 1 x daily - 7 x weekly - 2 sets - 10 reps - Supine Active Straight Leg Raise  - 1 x daily - 7 x weekly - 2 sets - 10 reps - Clamshell  - 1 x daily - 7 x weekly - 2 sets - 10 reps - Supine March with Posterior Pelvic Tilt  - 1 x daily - 7 x weekly - 2 sets - 10 reps - Standing Anti-Rotation Press with Anchored Resistance  - 1 x daily - 7 x weekly - 2 sets - 10 reps  ASSESSMENT:  CLINICAL IMPRESSION: Pt arrives for today's treatment session reporting 2/10 low back pain.  Pt reports some soreness from last treatment session, but nothing unexpected.  Reviewed previously performed exercises today with minimal cues for technique required.  STW/M performed to right lumbar paraspinals and left hip musculature to decrease pain and tone.  Pt reported very minimal pain at completion of today's treatment session.   OBJECTIVE IMPAIRMENTS: Abnormal gait, decreased activity tolerance, decreased balance, decreased coordination, decreased endurance, decreased mobility, difficulty walking, decreased ROM, decreased strength, hypomobility, increased fascial restrictions, increased muscle spasms, impaired flexibility, impaired sensation, improper body mechanics, postural dysfunction, and pain.   ACTIVITY LIMITATIONS: lifting, bending, standing, squatting, stairs, transfers, and locomotion level  PARTICIPATION LIMITATIONS:  meal prep, cleaning, laundry, shopping, and community activity  PERSONAL FACTORS: Age, Fitness, Past/current experiences, and Time since onset of injury/illness/exacerbation are also affecting patient's functional outcome.   REHAB POTENTIAL: Good  CLINICAL DECISION MAKING: Evolving/moderate complexity  EVALUATION COMPLEXITY: Moderate   GOALS: Goals reviewed with patient? Yes  SHORT TERM GOALS: Target date: 09/22/2024   Pt will be ind with initial HEP Baseline: Goal status: INITIAL  2.  Pt will report improved back pain by >/= 50% Baseline:  Goal status: INITIAL  3.  Pt will report improved knee pain by >/=25% Baseline:  Goal status: INITIAL    LONG TERM GOALS: Target date: 10/20/2024   Pt will be ind with management and progression of HEP Baseline:  Goal status: INITIAL  2.  Pt will be able to stand and do her dishes without having to lean on the counter  Baseline:  Goal status: INITIAL  3.  Pt will report improved modified Oswestry to </=5/50 to demo MCID Baseline:  Goal status: INITIAL  4.  Pt will be able to mop and sweep with tolerable pain levels Baseline:  Goal status: INITIAL  5.  Pt will report improved knee pain by >/=50% Baseline:  Goal status: INITIAL  6.  Pt will report improved back pain by >/=75% Baseline:  Goal status: INITIAL  PLAN:  PT FREQUENCY: 2x/week  PT DURATION: 8 weeks  PLANNED INTERVENTIONS: 97164- PT Re-evaluation, 97750- Physical Performance Testing, 97110-Therapeutic exercises, 97530- Therapeutic activity, [id number]- Neuromuscular re-education, 97535- Self Care, 02859- Manual therapy, 97116- Gait training, [phone] (1-2 muscles), 20561 (3+ muscles)- Dry Needling, Patient/Family education, Balance training, Stair training, Joint mobilization, and Spinal mobilization  NO VASO/COLD PACK,E-STIM,TRACTION, U/S OR IONTO PER [facility] BLUE   PLAN FOR NEXT SESSION: Work on chief strategy officer with sweeping/mopping. Strengthen bilat LEs  and core.    Delon DELENA Gosling, PTA 09/11/2024, 2:49 PM

## 2024-09-11 NOTE — Patient Instructions (Signed)
 Goal BP:  Less than 130/80  Take your medications faithfully as prescribed. Maintain a healthy weight. Get at least 150 minutes of aerobic exercise per week. Minimize salt intake, less than 2000 mg per day. Minimize alcohol intake.  DASH Eating Plan DASH stands for Dietary Approaches to Stop Hypertension. The DASH eating plan is a healthy eating plan that has been shown to reduce high blood pressure (hypertension). Additional health benefits may include reducing the risk of type 2 diabetes mellitus, heart disease, and stroke. The DASH eating plan may also help with weight loss.  WHAT DO I NEED TO KNOW ABOUT THE DASH EATING PLAN? For the DASH eating plan, you will follow these general guidelines: Choose foods with a percent daily value for sodium of less than 5% (as listed on the food label). Use salt-free seasonings or herbs instead of table salt or sea salt. Check with your health care provider or pharmacist before using salt substitutes. Eat lower-sodium products, often labeled as lower sodium or no salt added. Eat fresh foods. Eat more vegetables, fruits, and low-fat dairy products. Choose whole grains. Look for the word whole as the first word in the ingredient list. Choose fish and skinless chicken or [place] more often than red meat. Limit fish, poultry, and meat to 6 oz (170 g) each day. Limit sweets, desserts, sugars, and sugary drinks. Choose heart-healthy fats. Limit cheese to 1 oz (28 g) per day. Eat more home-cooked food and less restaurant, buffet, and fast food. Limit fried foods. Cook foods using methods other than frying. Limit canned vegetables. If you do use them, rinse them well to decrease the sodium. When eating at a restaurant, ask that your food be prepared with less salt, or no salt if possible.  WHAT FOODS CAN I EAT? Seek help from a dietitian for individual calorie needs.  Grains Whole grain or whole wheat bread. Hoshino rice. Whole grain or whole  wheat pasta. Quinoa, bulgur, and whole grain cereals. Low-sodium cereals. Corn or whole wheat flour tortillas. Whole grain cornbread. Whole grain crackers. Low-sodium crackers.  Vegetables Fresh or frozen vegetables (raw, steamed, roasted, or grilled). Low-sodium or reduced-sodium tomato and vegetable juices. Low-sodium or reduced-sodium tomato sauce and paste. Low-sodium or reduced-sodium canned vegetables.   Fruits All fresh, canned (in natural juice), or frozen fruits.  Meat and Other Protein Products Ground beef (85% or leaner), grass-fed beef, or beef trimmed of fat. Skinless chicken or [place]. Ground chicken or [place]. Pork trimmed of fat. All fish and seafood. Eggs. Dried beans, peas, or lentils. Unsalted nuts and seeds. Unsalted canned beans.  Dairy Low-fat dairy products, such as skim or 1% milk, 2% or reduced-fat cheeses, low-fat ricotta or cottage cheese, or plain low-fat yogurt. Low-sodium or reduced-sodium cheeses.  Fats and Oils Tub margarines without trans fats. Light or reduced-fat mayonnaise and salad dressings (reduced sodium). Avocado. Safflower, olive, or canola oils. Natural peanut or almond butter.  Other Unsalted popcorn and pretzels. The items listed above may not be a complete list of recommended foods or beverages. Contact your dietitian for more options.  WHAT FOODS ARE NOT RECOMMENDED?  Grains White bread. White pasta. White rice. Refined cornbread. Bagels and croissants. Crackers that contain trans fat.  Vegetables Creamed or fried vegetables. Vegetables in a cheese sauce. Regular canned vegetables. Regular canned tomato sauce and paste. Regular tomato and vegetable juices.  Fruits Dried fruits. Canned fruit in light or heavy syrup. Fruit juice.  Meat and Other Protein Products Fatty cuts of meat. Ribs,  chicken wings, bacon, sausage, bologna, salami, chitterlings, fatback, hot dogs, bratwurst, and packaged luncheon meats. Salted nuts and seeds. Canned  beans with salt.  Dairy Whole or 2% milk, cream, half-and-half, and cream cheese. Whole-fat or sweetened yogurt. Full-fat cheeses or blue cheese. Nondairy creamers and whipped toppings. Processed cheese, cheese spreads, or cheese curds.  Condiments Onion and garlic salt, seasoned salt, table salt, and sea salt. Canned and packaged gravies. Worcestershire sauce. Tartar sauce. Barbecue sauce. Teriyaki sauce. Soy sauce, including reduced sodium. Steak sauce. Fish sauce. Oyster sauce. Cocktail sauce. Horseradish. Ketchup and mustard. Meat flavorings and tenderizers. Bouillon cubes. Hot sauce. Tabasco sauce. Marinades. Taco seasonings. Relishes.  Fats and Oils Butter, stick margarine, lard, shortening, ghee, and bacon fat. Coconut, palm kernel, or palm oils. Regular salad dressings.  Other Pickles and olives. Salted popcorn and pretzels.  The items listed above may not be a complete list of foods and beverages to avoid. Contact your dietitian for more information.  WHERE CAN I FIND MORE INFORMATION? National Heart, Lung, and Blood Institute: [employer].it Document Released: 10/05/2011 Document Revised: 03/02/2014 Document Reviewed: 08/20/2013 [facility] Patient Information 2015 [place], [place]. This information is not intended to replace advice given to you by your health care provider. Make sure you discuss any questions you have with your health care provider.   I think that you would greatly benefit from seeing a nutritionist.  If you are interested, please call Dr. Wonda at [phone] to schedule an appointment.

## 2024-09-15 ENCOUNTER — Encounter: Payer: Self-pay | Admitting: Family Medicine

## 2024-09-16 ENCOUNTER — Other Ambulatory Visit: Payer: Self-pay

## 2024-09-16 ENCOUNTER — Ambulatory Visit

## 2024-09-16 DIAGNOSIS — M5459 Other low back pain: Secondary | ICD-10-CM

## 2024-09-16 DIAGNOSIS — G8929 Other chronic pain: Secondary | ICD-10-CM

## 2024-09-16 DIAGNOSIS — R293 Abnormal posture: Secondary | ICD-10-CM

## 2024-09-16 DIAGNOSIS — R002 Palpitations: Secondary | ICD-10-CM

## 2024-09-16 DIAGNOSIS — M6281 Muscle weakness (generalized): Secondary | ICD-10-CM

## 2024-09-16 NOTE — Therapy (Signed)
 OUTPATIENT PHYSICAL THERAPY TREATMENT   Patient Name: Margaret Schmitt MRN: [id number] DOB:[birth year], 63 y.o., female Today's Date: 09/16/2024  END OF SESSION:  PT End of Session - 09/16/24 1304     Visit Number 6    Number of Visits 16    Date for Recertification  10/20/24    Authorization Type [employer]    PT Start Time 1300    PT Stop Time 1342    PT Time Calculation (min) 42 min    Activity Tolerance Patient tolerated treatment well           Past Medical History:  Diagnosis Date   Asthma    Eczema    History of diverticulitis    Hypertension    Hypertension    Hypothyroidism    IBS (irritable bowel syndrome)    diagnose when living in [place]   Morbid obesity (HCC)    OSA (obstructive sleep apnea)    Thyroid  cancer (HCC)    Thyroid  disease    hypothyroidism   Past Surgical History:  Procedure Laterality Date   ABDOMINAL HYSTERECTOMY     ADENOIDECTOMY     APPENDECTOMY     BIOPSY  06/22/2023   Procedure: BIOPSY;  Surgeon: Cinderella Deatrice FALCON, MD;  Location: [facility];  Service: Endoscopy;;   CHOLECYSTECTOMY     COLONOSCOPY     around 2016. removed polyps.pt thinks she  was to repeat in 5 years. tcs done in [place] .   COLONOSCOPY WITH PROPOFOL  N/A 06/22/2023   Procedure: COLONOSCOPY WITH PROPOFOL ;  Surgeon: Cinderella Deatrice FALCON, MD;  Location: [facility];  Service: Endoscopy;  Laterality: N/A;  1:15pm;asa 3   POLYPECTOMY  06/22/2023   Procedure: POLYPECTOMY;  Surgeon: Cinderella Deatrice FALCON, MD;  [facility]: [facility];  Service: Endoscopy;;   SUBMUCOSAL LIFTING INJECTION  06/22/2023   Procedure: SUBMUCOSAL LIFTING INJECTION;  Surgeon: Cinderella Deatrice FALCON, MD;  Location: [facility];  Service: Endoscopy;;   THYROID  LOBECTOMY Left    TONSILLECTOMY     URETERAL REIMPLANTION Left    VAGINAL PROLAPSE REPAIR     used cadaver tissue   Patient Active Problem List   Diagnosis Date Noted   Chronic bilateral low back pain with left-sided sciatica 08/07/2024    Chronic pain of right knee 08/07/2024   Pre-diabetes 04/03/2024   Adenomatous polyp of colon 06/22/2023   History of colonic polyps 06/05/2023   IBS (irritable bowel syndrome) 04/06/2023   Depression, recurrent 08/04/2022   GAD (generalized anxiety disorder) 08/04/2022   History of hysterectomy 04/05/2022   History of thyroid  cancer 01/03/2022   RLS (restless legs syndrome) 08/13/2020   Circadian rhythm sleep disorder, 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   Hyperlipidemia 07/20/2020   Eczema    OSA (obstructive sleep apnea)    Essential hypertension 08/22/2018   Hypothyroidism 08/22/2018   Morbid obesity (HCC) 08/22/2018    PCP: Severa Rock HERO, FNP  REFERRING PROVIDER: Severa Rock HERO, FNP  REFERRING DIAG: [phone] (ICD-10-CM) - Chronic bilateral low back pain with left-sided sciatica M25.561,G89.29 (ICD-10-CM) - Chronic pain of right knee  Rationale for Evaluation and Treatment: Rehabilitation  THERAPY DIAG:  Muscle weakness (generalized)  Abnormal posture  Other low back pain  Chronic pain of right knee  ONSET DATE: Chronic back pain but exacerbated 2-3 weeks; knee pain worsening since 2019  SUBJECTIVE:  SUBJECTIVE STATEMENT: Pt reports 2/10 left low back pain today.   PERTINENT HISTORY:  Chronic back pain, scoliosis, high blood pressure, prediabetes, palpitations  PAIN:  Are you having pain? Yes: NPRS scale: 2/10 Pain location: L>R lumbosacral junction Pain description: burning dull pain; radiate front of L thigh Aggravating factors: mopping/sweeping, long prolonged standing (I.e. doing dishes) Relieving factors: Tylenol, ibuprofen , heat  Yes: NPRS scale: 0 at rest, at worst 3  Pain location: R knee Pain description: sharp Aggravating factors: Standing or twisting on knee Relieving  factors: Tylenol, ibuprofen , heat  PRECAUTIONS: None  RED FLAGS: None   WEIGHT BEARING RESTRICTIONS: No  FALLS:  Has patient fallen in last 6 months? Yes. Number of falls 2 -- last fall was off back steps  LIVING ENVIRONMENT: Lives with: mom and granddaughter Lives in: House/apartment Stairs: 3 steps to get in through car port, 2 steps in front of the house; level Has following equipment at home: None  OCCUPATION: Retired  PLOF: Independent  PATIENT GOALS: Decrease pain, less difficulty with work activities, improve movement, return to recreational activities, improve strength, stand and sit longer, less difficulty with home activities  NEXT MD VISIT: PRN  OBJECTIVE:  Note: Objective measures were completed at Evaluation unless otherwise noted.  DIAGNOSTIC FINDINGS:  Recent 09/02/24 Imaging: The x-rays of her right knee shows obvious lateral compartment arthritis. X-rays of her back show severe lumbar scoliosis. She also on the lateral view has about a grade 1 slip at L4-L5. Next on the x-rays, she has marked degenerative disk disease at L5-S1.  Impression:  1. Same as the x-rays.  2. Questionable foraminal stenosis involving that femoral nerve on the left.   PATIENT SURVEYS:  Modified Oswestry:  MODIFIED OSWESTRY DISABILITY SCALE  Date: 08/25/24 Score  Pain intensity 3 =  Pain medication provides me with moderate relief from pain.  2. Personal care (washing, dressing, etc.) 0 =  I can take care of myself normally without causing increased pain.  3. Lifting 3 = Pain prevents me from lifting heavy weights, but I can manage light to medium weights if they are conveniently positioned  4. Walking 1 = Pain prevents me from walking more than 1 mile.  5. Sitting 2 =  Pain prevents me from sitting more than 1 hour.  6. Standing 2 =  Pain prevents me from standing more than 1 hour  7. Sleeping 0 = Pain does not prevent me from sleeping well.  8. Social Life 2 = Pain prevents me  from participating in more energetic activities (eg. sports, dancing).  9. Traveling 1 =  I can travel anywhere, but it increases my pain.  10. Employment/ Homemaking 2 = I can perform most of my homemaking/job duties, but pain prevents me from performing more physically stressful activities (eg, lifting, vacuuming).  Total 16/50   Interpretation of scores: Score Category Description  0-20% Minimal Disability The patient can cope with most living activities. Usually no treatment is indicated apart from advice on lifting, sitting and exercise  21-40% Moderate Disability The patient experiences more pain and difficulty with sitting, lifting and standing. Travel and social life are more difficult and they may be disabled from work. Personal care, sexual activity and sleeping are not grossly affected, and the patient can usually be managed by conservative means  41-60% Severe Disability Pain remains the main problem in this group, but activities of daily living are affected. These patients require a detailed investigation  61-80% Crippled Back pain impinges on  all aspects of the patient's life. Positive intervention is required  81-100% Bed-bound These patients are either bed-bound or exaggerating their symptoms  Bluford FORBES Zoe DELENA Karon DELENA, et al. Surgery versus conservative management of stable thoracolumbar fracture: the PRESTO feasibility RCT. [place] ([place]): [employer]; 2021 Nov. [facility] Technology Assessment, No. 25.62.) Appendix 3, Oswestry Disability Index category descriptors. Available from: Findjewelers.cz  Minimally Clinically Important Difference (MCID) = 12.8%  COGNITION: Overall cognitive status: Within functional limits for tasks assessed     SENSATION: N/T L ant thigh occasionally Can get N/T along lateral 2 toes  MUSCLE LENGTH: Hamstrings: Right ~70 deg; Left ~50 deg Thomas test: did not assess  POSTURE: No Significant postural  limitations  PALPATION: TTP L SIJ, and R glute med  LUMBAR ROM:   AROM eval  Flexion 80%  Extension 100% pain  Right lateral flexion 100%  Left lateral flexion 100% slight pain  Right rotation 100%  Left rotation 100% slight pain   (Blank rows = not tested)  LOWER EXTREMITY ROM:     Active  Right eval Left eval  Hip flexion    Hip extension    Hip abduction    Hip adduction    Hip internal rotation    Hip external rotation    Knee flexion    Knee extension    Ankle dorsiflexion    Ankle plantarflexion    Ankle inversion    Ankle eversion     (Blank rows = not tested)  LOWER EXTREMITY MMT:    MMT Right eval Left eval  Hip flexion 4 4  Hip extension 3+ 3+  Hip abduction 3+ 3+ pain  Hip adduction    Hip internal rotation    Hip external rotation    Knee flexion 4 slight pain in back 4 slight pain in back  Knee extension 5 5  Ankle dorsiflexion    Ankle plantarflexion    Ankle inversion    Ankle eversion     (Blank rows = not tested)  LUMBAR SPECIAL TESTS:  Straight leg raise test: Positive and FABER test: Positive Supine to long sit: R LE lengthens, L LE shortens  FUNCTIONAL TESTS:  Did not assess  GAIT: Distance walked: Into clinic Assistive device utilized: None Level of assistance: Complete Independence Comments: Mildly antalgic  TREATMENT DATE:  09/11/24                                  EXERCISE LOG  Exercise Repetitions and Resistance Comments  Nustep  Lvl 2 x 15 mins   Rockerboard 4 mins   Lunges 14 box x 1 mins each   Standing Hip Abduction Red x 25 reps bil   Standing Hip Flexion Red x 25 reps bil   Standing Hip Extension Red x 25 reps bil   Ball Roll Out    Goal Assessment See Below    Blank cell = exercise not performed today   09/03/24 Nustep L1 x 12 min UEs/LEs Supine SAQ 2# 2x10 Supine PPT 2x10 with diaphragmatic breaths 2x10 Supine ab set with push down of UEs 2x10 Supine hip abd 2# 2x10 Manual therapy: STM & TPR glute  med/max bilat   PATIENT EDUCATION:  Education details: Exam findings, POC Person educated: Patient Education method: Explanation Education comprehension: verbalized understanding  HOME EXERCISE PROGRAM: Access Code: [email or web address] URL: [email or web address] Date: 09/01/2024 Prepared by: Gellen April Earnie Starring  Exercises -  Supine Bridge with Mini Swiss Ball Between Knees  - 1 x daily - 7 x weekly - 2 sets - 10 reps - Supine Active Straight Leg Raise  - 1 x daily - 7 x weekly - 2 sets - 10 reps - Clamshell  - 1 x daily - 7 x weekly - 2 sets - 10 reps - Supine March with Posterior Pelvic Tilt  - 1 x daily - 7 x weekly - 2 sets - 10 reps - Standing Anti-Rotation Press with Anchored Resistance  - 1 x daily - 7 x weekly - 2 sets - 10 reps  [email or web address]  Access [email or web address]: [email or web address]  ASSESSMENT:  CLINICAL IMPRESSION: Pt arrives for today's treatment session reporting 2/10 low back pain.  Pt reports that her low back pain is approximately 40% better, but states that she has not noticed any difference in her right knee pain.  Pt given updated HEP and resistance band for home use.  Pt encouraged to call the facility with any questions or concerns.  Pt reported minimal decrease in pain at completion of today's treatment session.  Pt ready for discharge at this time.   OBJECTIVE IMPAIRMENTS: Abnormal gait, decreased activity tolerance, decreased balance, decreased coordination, decreased endurance, decreased mobility, difficulty walking, decreased ROM, decreased strength, hypomobility, increased fascial restrictions, increased muscle spasms, impaired flexibility, impaired sensation, improper body mechanics, postural dysfunction, and pain.   ACTIVITY LIMITATIONS: lifting, bending, standing, squatting, stairs, transfers, and locomotion level  PARTICIPATION LIMITATIONS: meal prep, cleaning, laundry, shopping, and community activity  PERSONAL FACTORS: Age, Fitness,  Past/current experiences, and Time since onset of injury/illness/exacerbation are also affecting patient's functional outcome.   REHAB POTENTIAL: Good  CLINICAL DECISION MAKING: Evolving/moderate complexity  EVALUATION COMPLEXITY: Moderate   GOALS: Goals reviewed with patient? Yes  SHORT TERM GOALS: Target date: 09/22/2024   Pt will be ind with initial HEP Baseline: Goal status: MET  2.  Pt will report improved back pain by >/= 50% Baseline:  Goal status: MET  3.  Pt will report improved knee pain by >/=25% Baseline: 11/18: no improvement noted Goal status: IN PROGRESS    LONG TERM GOALS: Target date: 10/20/2024   Pt will be ind with management and progression of HEP Baseline:  Goal status: MET  2.  Pt will be able to stand and do her dishes without having to lean on the counter  Baseline: 11/18: can tolerate longer standing time before requiring lean Goal status: IN PROGRESS  3.  Pt will report improved modified Oswestry to </=5/50 to demo MCID Baseline:  Goal status: INITIAL  4.  Pt will be able to mop and sweep with tolerable pain levels Baseline:  Goal status: MET  5.  Pt will report improved knee pain by >/=50% Baseline: 11/18: no improvement noticed Goal status: IN PROGRESS  6.  Pt will report improved back pain by >/=75% Baseline: 11/18: ~60% Goal status: IN PROGRESS  PLAN:  PT FREQUENCY: 2x/week  PT DURATION: 8 weeks  PLANNED INTERVENTIONS: 97164- PT Re-evaluation, 97750- Physical Performance Testing, 97110-Therapeutic exercises, 97530- Therapeutic activity, [id number]- Neuromuscular re-education, 97535- Self Care, [id number]- Manual therapy, 97116- Gait training, [phone] (1-2 muscles), 20561 (3+ muscles)- Dry Needling, Patient/Family education, Balance training, Stair training, Joint mobilization, and Spinal mobilization  NO VASO/COLD PACK,E-STIM,TRACTION, U/S OR IONTO PER [facility]   PLAN FOR NEXT SESSION: Work on chief strategy officer with  sweeping/mopping. Strengthen bilat LEs and core.    Delon DELENA Gosling, PTA 09/16/2024, 3:16 PM

## 2024-10-02 ENCOUNTER — Ambulatory Visit (INDEPENDENT_AMBULATORY_CARE_PROVIDER_SITE_OTHER): Admitting: *Deleted

## 2024-10-02 ENCOUNTER — Telehealth: Payer: Self-pay | Admitting: Cardiology

## 2024-10-02 DIAGNOSIS — I10 Essential (primary) hypertension: Secondary | ICD-10-CM

## 2024-10-02 NOTE — Progress Notes (Signed)
Patient seen for blood pressure check.

## 2024-10-02 NOTE — Telephone Encounter (Signed)
 Pt requesting a provider switch from Dr. Alvan to Dr. Lavona in [place] due to location.

## 2024-10-03 ENCOUNTER — Ambulatory Visit: Payer: Self-pay | Admitting: Family Medicine

## 2024-10-24 ENCOUNTER — Ambulatory Visit: Admitting: Cardiology

## 2024-11-21 ENCOUNTER — Other Ambulatory Visit: Payer: Self-pay

## 2024-11-21 ENCOUNTER — Emergency Department (HOSPITAL_COMMUNITY)

## 2024-11-21 ENCOUNTER — Encounter: Payer: Self-pay | Admitting: Family Medicine

## 2024-11-21 ENCOUNTER — Ambulatory Visit: Admitting: Family Medicine

## 2024-11-21 ENCOUNTER — Encounter (HOSPITAL_COMMUNITY): Payer: Self-pay | Admitting: Emergency Medicine

## 2024-11-21 ENCOUNTER — Emergency Department (HOSPITAL_COMMUNITY)
Admission: EM | Admit: 2024-11-21 | Discharge: 2024-11-21 | Disposition: A | Discharge status: Home / Self Care | Attending: Emergency Medicine | Admitting: Emergency Medicine

## 2024-11-21 VITALS — BP 222/130 | HR 66 | Temp 98.1°F | Ht 67.0 in | Wt 278.0 lb

## 2024-11-21 DIAGNOSIS — I10 Essential (primary) hypertension: Secondary | ICD-10-CM

## 2024-11-21 DIAGNOSIS — M5441 Lumbago with sciatica, right side: Secondary | ICD-10-CM | POA: Insufficient documentation

## 2024-11-21 DIAGNOSIS — M545 Low back pain, unspecified: Secondary | ICD-10-CM

## 2024-11-21 DIAGNOSIS — Z79899 Other long term (current) drug therapy: Secondary | ICD-10-CM | POA: Insufficient documentation

## 2024-11-21 DIAGNOSIS — M5440 Lumbago with sciatica, unspecified side: Secondary | ICD-10-CM

## 2024-11-21 LAB — COMPREHENSIVE METABOLIC PANEL WITH GFR
ALT: 12 U/L (ref 0–44)
AST: 19 U/L (ref 15–41)
Albumin: 4.4 g/dL (ref 3.5–5.0)
Alkaline Phosphatase: 82 U/L (ref 38–126)
Anion gap: 12 (ref 5–15)
BUN: 15 mg/dL (ref 8–23)
CO2: 24 mmol/L (ref 22–32)
Calcium: 9.1 mg/dL (ref 8.9–10.3)
Chloride: 106 mmol/L (ref 98–111)
Creatinine, Ser: 1.05 mg/dL — ABNORMAL HIGH (ref 0.44–1.00)
GFR, Estimated: 60 mL/min — ABNORMAL LOW
Glucose, Bld: 117 mg/dL — ABNORMAL HIGH (ref 70–99)
Potassium: 4 mmol/L (ref 3.5–5.1)
Sodium: 142 mmol/L (ref 135–145)
Total Bilirubin: 0.4 mg/dL (ref 0.0–1.2)
Total Protein: 7.5 g/dL (ref 6.5–8.1)

## 2024-11-21 LAB — CBC WITH DIFFERENTIAL/PLATELET
Abs Immature Granulocytes: 0.01 K/uL (ref 0.00–0.07)
Basophils Absolute: 0.1 K/uL (ref 0.0–0.1)
Basophils Relative: 1 %
Eosinophils Absolute: 0.3 K/uL (ref 0.0–0.5)
Eosinophils Relative: 4 %
HCT: 43.9 % (ref 36.0–46.0)
Hemoglobin: 14.1 g/dL (ref 12.0–15.0)
Immature Granulocytes: 0 %
Lymphocytes Relative: 18 %
Lymphs Abs: 1.3 K/uL (ref 0.7–4.0)
MCH: 26.9 pg (ref 26.0–34.0)
MCHC: 32.1 g/dL (ref 30.0–36.0)
MCV: 83.6 fL (ref 80.0–100.0)
Monocytes Absolute: 0.4 K/uL (ref 0.1–1.0)
Monocytes Relative: 6 %
Neutro Abs: 4.9 K/uL (ref 1.7–7.7)
Neutrophils Relative %: 71 %
Platelets: 276 K/uL (ref 150–400)
RBC: 5.25 MIL/uL — ABNORMAL HIGH (ref 3.87–5.11)
RDW: 14.7 % (ref 11.5–15.5)
WBC: 7 K/uL (ref 4.0–10.5)
nRBC: 0 % (ref 0.0–0.2)

## 2024-11-21 MED ORDER — HYDRALAZINE HCL 20 MG/ML IJ SOLN
10.0000 mg | Freq: Once | INTRAMUSCULAR | Status: AC
  Administered 2024-11-21: 10 mg via INTRAVENOUS
  Filled 2024-11-21: qty 1

## 2024-11-21 MED ORDER — PREDNISONE 10 MG PO TABS: 20.0000 mg | ORAL_TABLET | Freq: Every day | ORAL | 0 refills | Status: AC

## 2024-11-21 MED ORDER — OXYCODONE-ACETAMINOPHEN 5-325 MG PO TABS: 1.0000 | ORAL_TABLET | Freq: Four times a day (QID) | ORAL | 0 refills | Status: AC | PRN

## 2024-11-21 MED ORDER — HYDROMORPHONE HCL 1 MG/ML IJ SOLN
0.5000 mg | Freq: Once | INTRAMUSCULAR | Status: AC
  Administered 2024-11-21: 0.5 mg via INTRAVENOUS
  Filled 2024-11-21: qty 0.5

## 2024-11-21 MED ORDER — ONDANSETRON HCL 4 MG/2ML IJ SOLN
4.0000 mg | Freq: Once | INTRAMUSCULAR | Status: AC
  Administered 2024-11-21: 4 mg via INTRAVENOUS
  Filled 2024-11-21: qty 2

## 2024-11-21 NOTE — Discharge Instructions (Signed)
 Increase your Lopressor  so you are taking a full tablet in the evening.  Follow-up with your back doctor and your cardiologist as planned

## 2024-11-21 NOTE — Progress Notes (Signed)
 "    Subjective:  Patient ID: Margaret Schmitt, female    DOB: 13-Jul-1961, 64 y.o.   MRN: [id number]  Patient Care Team: Severa Rock HERO, FNP as PCP - General (Family Medicine) Alvan Dorn FALCON, MD as PCP - Cardiology (Cardiology)   Chief Complaint:  Back Pain (Patient reports extreme back pain for the past 4 days; left mid area of back, on left side of spine is where the most pain is. )   HPI: Margaret Schmitt is a 64 y.o. female presenting on 11/21/2024 for Back Pain (Patient reports extreme back pain for the past 4 days; left mid area of back, on left side of spine is where the most pain is. )   Margaret Schmitt is a 64 year old female who presents with severe mid back pain.  She has been experiencing severe mid back pain for four days, which has progressively worsened. The pain is described as sharp and aching, currently rated at 8-9 out of 10, but was 10 out of 10 when attempting to get out of bed this morning. She suspects the pain may have started after sitting in a twisted position on her bed while watching TV and writing, or possibly from doing abdominal exercises on a stability ball the day before.  She has a history of chronic back pain, but states that this current pain is worse than her usual back pain. She has taken 800 mg of ibuprofen  at 8 AM today. She also took her blood pressure medication at 7 AM and a Benadryl  before the visit due to anxiety.  No recent injury, loss of bowel or bladder function, numbness or tingling in the genital area or legs, and no pain or inability to move her legs. She mentions that her urine appears lighter than usual, which she attributes to reduced soda intake.  Her blood pressure was significantly elevated at home, and she reports that her smartwatch indicated an elevated heart rate last night, prompting her to check her blood pressure, which was also elevated at that time.     She denies chest pain, shortness of breath, confusion, weakness, or  visual changes. No slurred speech or loss of function.      Relevant past medical, surgical, family, and social history reviewed and updated as indicated.  Allergies and medications reviewed and updated. Data reviewed: Chart in Epic.   Past Medical History:  Diagnosis Date   Asthma    Eczema    History of diverticulitis    Hypertension    Hypertension    Hypothyroidism    IBS (irritable bowel syndrome)    diagnose when living in [place]   Morbid obesity (HCC)    OSA (obstructive sleep apnea)    Thyroid  cancer (HCC)    Thyroid  disease    hypothyroidism    Past Surgical History:  Procedure Laterality Date   ABDOMINAL HYSTERECTOMY     ADENOIDECTOMY     APPENDECTOMY     BIOPSY  06/22/2023   Procedure: BIOPSY;  Surgeon: Cinderella Deatrice FALCON, MD;  Location: [facility];  Service: Endoscopy;;   CHOLECYSTECTOMY     COLONOSCOPY     around 2016. removed polyps.pt thinks she  was to repeat in 5 years. tcs done in [place] .   COLONOSCOPY WITH PROPOFOL  N/A 06/22/2023   Procedure: COLONOSCOPY WITH PROPOFOL ;  Surgeon: Cinderella Deatrice FALCON, MD;  Location: [facility];  Service: Endoscopy;  Laterality: N/A;  1:15pm;asa 3   POLYPECTOMY  06/22/2023  Procedure: POLYPECTOMY;  Surgeon: Cinderella Deatrice FALCON, MD;  Location: [facility];  Service: Endoscopy;;   SUBMUCOSAL LIFTING INJECTION  06/22/2023   Procedure: SUBMUCOSAL LIFTING INJECTION;  Surgeon: Cinderella Deatrice FALCON, MD;  Location: [facility];  Service: Endoscopy;;   THYROID  LOBECTOMY Left    TONSILLECTOMY     URETERAL REIMPLANTION Left    VAGINAL PROLAPSE REPAIR     used cadaver tissue    Social History   Socioeconomic History   Marital status: Single    Spouse name: Not on file   Number of children: Not on file   Years of education: Not on file   Highest education level: GED or equivalent  Occupational History   Not on file  Tobacco Use   Smoking status: Former    Types: Cigarettes    Start date: 08/26/2022    Passive exposure:  Past   Smokeless tobacco: Never  Vaping Use   Vaping status: Never Used  Substance and Sexual Activity   Alcohol use: Yes    Comment: rarely - once a year.   Drug use: Never   Sexual activity: Not Currently  Other Topics Concern   Not on file  Social History Narrative   Not on file   Social Drivers of Health   Tobacco Use: Medium Risk (11/21/2024)   Patient History    Smoking Tobacco Use: Former    Smokeless Tobacco Use: Never    Passive Exposure: Past  Physicist, Medical Strain: Low Risk (09/10/2024)   Overall Financial Resource Strain (CARDIA)    Difficulty of Paying Living Expenses: Not very hard  Food Insecurity: Food Insecurity Present (09/10/2024)   Epic    Worried About Programme Researcher, Broadcasting/film/video in the Last Year: Sometimes true    Ran Out of Food in the Last Year: Never true  Transportation Needs: No Transportation Needs (09/10/2024)   Epic    Lack of Transportation (Medical): No    Lack of Transportation (Non-Medical): No  Physical Activity: Insufficiently Active (09/10/2024)   Exercise Vital Sign    Days of Exercise per Week: 2 days    Minutes of Exercise per Session: 30 min  Stress: Stress Concern Present (09/10/2024)   [employer] of Occupational Health - Occupational Stress Questionnaire    Feeling of Stress: Very much  Social Connections: Moderately Isolated (09/10/2024)   Social Connection and Isolation Panel    Frequency of Communication with Friends and Family: More than three times a week    Frequency of Social Gatherings with Friends and Family: Three times a week    Attends Religious Services: More than 4 times per year    Active Member of Clubs or Organizations: No    Attends Banker Meetings: Not on file    Marital Status: Divorced  Intimate Partner Violence: Not At Risk (05/08/2022)   Received [facility]   Epic    Within the last year, have you been afraid of your partner or ex-partner?: No    Within the last year, have you  been humiliated or emotionally abused in other ways by your partner or ex-partner?: No    Within the last year, have you been kicked, hit, slapped, or otherwise physically hurt by your partner or ex-partner?: No    Within the last year, have you been raped or forced to have any kind of sexual activity by your partner or ex-partner?: No  Depression (PHQ2-9): Medium Risk (11/21/2024)   Depression (PHQ2-9)  PHQ-2 Score: 9  Alcohol Screen: Not on file  Housing: Low Risk (09/10/2024)   Epic    Unable to Pay for Housing in the Last Year: No    Number of Times Moved in the Last Year: 1    Homeless in the Last Year: No  Utilities: Not on file  Health Literacy: Low Risk (05/08/2022)   Received from [facility]    How often do you need to have someone help you when you read instructions, pamphlets, or other written material from your doctor or pharmacy?: Never    Outpatient Encounter Medications as of 11/21/2024  Medication Sig   acetaminophen  (TYLENOL ) 500 MG tablet Take 1,000 mg by mouth every 6 (six) hours as needed for mild pain (pain score 1-3).   cholecalciferol (VITAMIN D3) 25 MCG (1000 UNIT) tablet Take 1,000 Units by mouth daily.   furosemide  (LASIX ) 20 MG tablet TAKE 1 TABLET (20 MG TOTAL) BY MOUTH AS NEEDED (FOR SWELLING).   ibuprofen  (ADVIL ) 200 MG tablet Take 400 mg by mouth every 6 (six) hours as needed for mild pain (pain score 1-3).   levothyroxine  (SYNTHROID ) 200 MCG tablet Take 1 tablet (200 mcg total) by mouth daily before breakfast.   losartan  (COZAAR ) 100 MG tablet Take 1 tablet (100 mg total) by mouth daily.   metoprolol  tartrate (LOPRESSOR ) 25 MG tablet Take 0.5 tablets (12.5 mg total) by mouth 2 (two) times daily. (May take an additional 12.5 as needed for palpitations)   triamcinolone  (NASACORT ) 55 MCG/ACT AERO nasal inhaler Place 2 sprays into the nose daily as needed (congestion, seasonal allergies).   KLOR-CON  M20 20 MEQ tablet TAKE 1 TABLET (20 MEQ  TOTAL) BY MOUTH AS NEEDED. ONLY WHEN TAKING LASIX  20 MG. (Patient not taking: Reported on 11/21/2024)   No facility-administered encounter medications on file as of 11/21/2024.    Allergies[1]  Pertinent ROS per HPI, otherwise unremarkable      Objective:  BP (!) 222/130 (BP Location: Right Arm) Comment: 198/118 left arm  Pulse 66   Temp 98.1 F (36.7 C)   Ht 5' 7 (1.702 m)   Wt 278 lb (126.1 kg)   SpO2 98%   BMI 43.54 kg/m    Wt Readings from Last 3 Encounters:  11/21/24 278 lb (126.1 kg)  09/11/24 280 lb 12.8 oz (127.4 kg)  08/07/24 276 lb 9.6 oz (125.5 kg)    Physical Exam Vitals and nursing note reviewed.  Constitutional:      General: She is in acute distress.     Appearance: She is morbidly obese. She is ill-appearing. She is not toxic-appearing or diaphoretic.  HENT:     Head: Normocephalic and atraumatic.     Nose: Nose normal.     Mouth/Throat:     Mouth: Mucous membranes are moist.     Pharynx: Oropharynx is clear.  Eyes:     Conjunctiva/sclera: Conjunctivae normal.     Pupils: Pupils are equal, round, and reactive to light.  Neck:     Vascular: No carotid bruit.     Trachea: Trachea and phonation normal.  Cardiovascular:     Rate and Rhythm: Normal rate and regular rhythm.     Heart sounds: Normal heart sounds.  Pulmonary:     Effort: Pulmonary effort is normal.     Breath sounds: Normal breath sounds.  Abdominal:     General: There is no distension.     Tenderness: There is no abdominal tenderness.  Musculoskeletal:     Cervical back: Normal and neck supple.     Thoracic back: Tenderness present. No deformity.     Lumbar back: Tenderness present. No deformity. Negative right straight leg raise test and negative left straight leg raise test.     Right lower leg: No edema.     Left lower leg: No edema.  Skin:    General: Skin is warm and dry.     Capillary Refill: Capillary refill takes less than 2 seconds.  Neurological:     General: No focal  deficit present.     Mental Status: She is alert and oriented to person, place, and time.  Psychiatric:        Attention and Perception: Attention normal.        Mood and Affect: Mood is anxious. Affect is tearful.        Speech: Speech normal.        Behavior: Behavior is cooperative.       Results for orders placed or performed in visit on 09/11/24  Bayer DCA Hb A1c Waived   Collection Time: 09/11/24  8:22 AM  Result Value Ref Range   HB A1C (BAYER DCA - WAIVED) 5.9 (H) 4.8 - 5.6 %  CMP14+EGFR   Collection Time: 09/11/24  8:24 AM  Result Value Ref Range   Glucose 97 70 - 99 mg/dL   BUN 21 8 - 27 mg/dL   Creatinine, Ser 9.09 0.57 - 1.00 mg/dL   eGFR 72 >40 fO/fpw/8.26   BUN/Creatinine Ratio 23 12 - 28   Sodium 144 134 - 144 mmol/L   Potassium 4.7 3.5 - 5.2 mmol/L   Chloride 107 (H) 96 - 106 mmol/L   CO2 23 20 - 29 mmol/L   Calcium 9.3 8.7 - 10.3 mg/dL   Total Protein 6.6 6.0 - 8.5 g/dL   Albumin 4.2 3.9 - 4.9 g/dL   Globulin, Total 2.4 1.5 - 4.5 g/dL   Bilirubin Total 0.3 0.0 - 1.2 mg/dL   Alkaline Phosphatase 86 49 - 135 IU/L   AST 15 0 - 40 IU/L   ALT 12 0 - 32 IU/L  CBC with Differential/Platelet   Collection Time: 09/11/24  8:24 AM  Result Value Ref Range   WBC 10.2 3.4 - 10.8 x10E3/uL   RBC 4.88 3.77 - 5.28 x10E6/uL   Hemoglobin 12.9 11.1 - 15.9 g/dL   Hematocrit 58.8 65.9 - 46.6 %   MCV 84 79 - 97 fL   MCH 26.4 (L) 26.6 - 33.0 pg   MCHC 31.4 (L) 31.5 - 35.7 g/dL   RDW 84.3 (H) 88.2 - 84.5 %   Platelets 287 150 - 450 x10E3/uL   Neutrophils 59 Not Estab. %   Lymphs 28 Not Estab. %   Monocytes 7 Not Estab. %   Eos 5 Not Estab. %   Basos 1 Not Estab. %   Neutrophils Absolute 6.0 1.4 - 7.0 x10E3/uL   Lymphocytes Absolute 2.8 0.7 - 3.1 x10E3/uL   Monocytes Absolute 0.8 0.1 - 0.9 x10E3/uL   EOS (ABSOLUTE) 0.5 (H) 0.0 - 0.4 x10E3/uL   Basophils Absolute 0.1 0.0 - 0.2 x10E3/uL   Immature Granulocytes 0 Not Estab. %   Immature Grans (Abs) 0.0 0.0 - 0.1  x10E3/uL  Lipid panel   Collection Time: 09/11/24  8:24 AM  Result Value Ref Range   Cholesterol, Total 206 (H) 100 - 199 mg/dL   Triglycerides 859 0 - 149 mg/dL   HDL 63 >60  mg/dL   VLDL Cholesterol Cal 25 5 - 40 mg/dL   LDL Chol Calc (NIH) 881 (H) 0 - 99 mg/dL   Chol/HDL Ratio 3.3 0.0 - 4.4 ratio       Pertinent labs & imaging results that were available during my care of the patient were reviewed by me and considered in my medical decision making.  Assessment & Plan:  Margaret Schmitt was seen today for back pain.  Diagnoses and all orders for this visit:  Severe hypertension  Severe low back pain        Severe acute back pain Acute onset of severe sharp back pain in the left mid back, worsening over four days, rated 9/10 in severity. No history of trauma or injury, but recent activities include twisting while sitting and using a stability ball. No associated neurological symptoms such as numbness, tingling, or loss of bowel/bladder function. Differential diagnosis includes musculoskeletal strain versus more serious conditions like aortic dissection, given the severity of pain and elevated blood pressure. - Arranged ambulance transport to the hospital for further evaluation and management. - Ordered stat labs and imaging to rule out aortic dissection and other serious conditions.  Hypertensive crisis Blood pressure significantly elevated at 222/130 mmHg in the right arm and 198/112 mmHg in the left arm. Recent home readings also elevated. She took her blood pressure medication this morning. The elevated blood pressure is concerning in the context of severe back pain, necessitating further evaluation to rule out complications such as aortic dissection. - Arranged ambulance transport to the hospital for further evaluation and management. - Ordered stat labs and imaging to rule out aortic dissection and other serious conditions.          Continue all other maintenance  medications.  Follow up plan: ED via EMS for severe back pain and hypertensive urgency   The above assessment and management plan was discussed with the patient. The patient verbalized understanding of and has agreed to the management plan. Patient is aware to call the clinic if they develop any new symptoms or if symptoms persist or worsen. Patient is aware when to return to the clinic for a follow-up visit. Patient educated on when it is appropriate to go to the emergency department.   Margaret Bruns, FNP-C [facility] [phone]     [1]  Allergies Allergen Reactions   Erythromycin Swelling    Facial swelling   "

## 2024-11-21 NOTE — ED Triage Notes (Signed)
 Pt bib [facility] from [facility]. C/o of left sided mid back pain that radiates to the left side x 4 days. Pt states the only thing that could have done it is her working out with a exercise ball the day before. Denies injury at that time. Also pt has hypertension which she is on medication for. Has not missed any doses.

## 2024-11-22 NOTE — ED Provider Notes (Signed)
 " [place] EMERGENCY DEPARTMENT AT [facility] Provider Note   CSN: [id number] Arrival date & time: 11/21/24  1010     Patient presents with: Back Pain   Margaret Schmitt is a 64 y.o. female.   Patient complains of right flank pain and lower back pain and poorly controlled blood pressure.  Patient has history of hypertension  The history is provided by the patient and medical records. No language interpreter was used.  Back Pain Location:  Lumbar spine Quality:  Aching Radiates to:  R thigh Pain severity:  Moderate Onset quality:  Sudden Timing:  Constant Progression:  Worsening Chronicity:  New Relieved by:  Nothing Worsened by:  Nothing Associated symptoms: no abdominal pain, no chest pain and no headaches        Prior to Admission medications  Medication Sig Start Date End Date Taking? Authorizing Provider  oxyCODONE -acetaminophen  (PERCOCET/ROXICET) 5-325 MG tablet Take 1 tablet by mouth every 6 (six) hours as needed for severe pain (pain score 7-10). 11/21/24  Yes Suzette Pac, MD  predniSONE  (DELTASONE ) 10 MG tablet Take 2 tablets (20 mg total) by mouth daily. 11/21/24  Yes Suzette Pac, MD  acetaminophen  (TYLENOL ) 500 MG tablet Take 1,000 mg by mouth every 6 (six) hours as needed for mild pain (pain score 1-3).    [provider]  cholecalciferol (VITAMIN D3) 25 MCG (1000 UNIT) tablet Take 1,000 Units by mouth daily.    [provider]  furosemide  (LASIX ) 20 MG tablet TAKE 1 TABLET (20 MG TOTAL) BY MOUTH AS NEEDED (FOR SWELLING). 12/27/22 11/21/24  Alvan Dorn FALCON, MD  ibuprofen  (ADVIL ) 200 MG tablet Take 400 mg by mouth every 6 (six) hours as needed for mild pain (pain score 1-3).    [provider]  KLOR-CON  M20 20 MEQ tablet TAKE 1 TABLET (20 MEQ TOTAL) BY MOUTH AS NEEDED. ONLY WHEN TAKING LASIX  20 MG. Patient not taking: Reported on 11/21/2024 12/27/22   Alvan Dorn FALCON, MD  levothyroxine  (SYNTHROID ) 200 MCG tablet Take 1  tablet (200 mcg total) by mouth daily before breakfast. 09/11/24   Rakes, Rock HERO, FNP  losartan  (COZAAR ) 100 MG tablet Take 1 tablet (100 mg total) by mouth daily. 09/11/24   Severa Rock HERO, FNP  metoprolol  tartrate (LOPRESSOR ) 25 MG tablet Take 0.5 tablets (12.5 mg total) by mouth 2 (two) times daily. (May take an additional 12.5 as needed for palpitations) 09/04/24   Franklyn Sid SAILOR, MD  triamcinolone  (NASACORT ) 55 MCG/ACT AERO nasal inhaler Place 2 sprays into the nose daily as needed (congestion, seasonal allergies).    [provider]    Allergies: Erythromycin    Review of Systems  Constitutional:  Negative for appetite change and fatigue.  HENT:  Negative for congestion, ear discharge and sinus pressure.   Eyes:  Negative for discharge.  Respiratory:  Negative for cough.   Cardiovascular:  Negative for chest pain.  Gastrointestinal:  Negative for abdominal pain and diarrhea.  Genitourinary:  Negative for frequency and hematuria.  Musculoskeletal:  Positive for back pain.  Skin:  Negative for rash.  Neurological:  Negative for seizures and headaches.  Psychiatric/Behavioral:  Negative for hallucinations.     Updated Vital Signs BP (!) 147/96   Pulse 71   Temp 99.1 F (37.3 C) (Oral)   Resp 18   Ht 5' 7 (1.702 m)   Wt 126.1 kg   SpO2 97%   BMI 43.54 kg/m   Physical Exam Vitals and nursing note  reviewed.  Constitutional:      Appearance: Normal appearance. She is well-developed.  HENT:     Head: Normocephalic.     Nose: Nose normal.  Eyes:     General: No scleral icterus.    Conjunctiva/sclera: Conjunctivae normal.  Neck:     Thyroid : No thyromegaly.     Trachea: No tracheal deviation.  Cardiovascular:     Rate and Rhythm: Normal rate and regular rhythm.     Heart sounds: No murmur heard.    No friction rub. No gallop.  Pulmonary:     Breath sounds: No stridor. No wheezing or rales.  Chest:     Chest wall: No tenderness.  Abdominal:     General:  There is no distension.     Tenderness: There is no abdominal tenderness. There is no rebound.  Musculoskeletal:        General: Normal range of motion.     Cervical back: Neck supple.     Comments: Tender lumbar spine  Lymphadenopathy:     Cervical: No cervical adenopathy.  Skin:    General: Skin is warm.     Findings: No erythema or rash.  Neurological:     Mental Status: She is alert and oriented to person, place, and time.     Motor: No abnormal muscle tone.     Coordination: Coordination normal.  Psychiatric:        Behavior: Behavior normal.     (all labs ordered are listed, but only abnormal results are displayed) Labs Reviewed  CBC WITH DIFFERENTIAL/PLATELET - Abnormal; Notable for the following components:      Result Value   RBC 5.25 (*)    All other components within normal limits  COMPREHENSIVE METABOLIC PANEL WITH GFR - Abnormal; Notable for the following components:   Glucose, Bld 117 (*)    Creatinine, Ser 1.05 (*)    GFR, Estimated 60 (*)    All other components within normal limits    EKG: None  Radiology: CT Renal Stone Study Result Date: 11/21/2024 CLINICAL DATA:  Left-sided abdominal pain for 4 days EXAM: CT ABDOMEN AND PELVIS WITHOUT CONTRAST TECHNIQUE: Multidetector CT imaging of the abdomen and pelvis was performed following the standard protocol without IV contrast. RADIATION DOSE REDUCTION: This exam was performed according to the departmental dose-optimization program which includes automated exposure control, adjustment of the mA and/or kV according to patient size and/or use of iterative reconstruction technique. COMPARISON:  None Available. FINDINGS: Lower chest: No acute abnormality. Hepatobiliary: No focal liver abnormality is seen. Status post cholecystectomy. No biliary dilatation. Pancreas: Unremarkable. No pancreatic ductal dilatation or surrounding inflammatory changes. Spleen: Normal in size without focal abnormality. Adrenals/Urinary Tract:  Adrenal glands appear normal. Bilateral renal cortical scarring is noted. Severe right renal atrophy is noted. No hydronephrosis or renal obstruction is noted. Urinary bladder is unremarkable. Stomach/Bowel: Stomach is unremarkable. Status post appendectomy. No evidence of bowel obstruction or inflammation. Sigmoid diverticulosis without inflammation. Vascular/Lymphatic: No significant vascular findings are present. No enlarged abdominal or pelvic lymph nodes. Reproductive: Status post hysterectomy. No adnexal masses. Other: No abdominal wall hernia or abnormality. No abdominopelvic ascites. Musculoskeletal: No acute or significant osseous findings. IMPRESSION: 1. Sigmoid diverticulosis without inflammation. 2. Bilateral renal cortical scarring is noted. Severe right renal atrophy is noted. 3. No acute abnormality seen in the abdomen or pelvis. Electronically Signed   By: Lynwood Landy Raddle M.D.   On: 11/21/2024 11:45   CT L-SPINE NO CHARGE Result Date: 11/21/2024 EXAM:  CT OF THE LUMBAR SPINE WITHOUT CONTRAST 11/21/2024 11:07:52 AM TECHNIQUE: CT of the lumbar spine was performed without the administration of intravenous contrast. Multiplanar reformatted images are provided for review. Automated exposure control, iterative reconstruction, and/or weight based adjustment of the mA/kV was utilized to reduce the radiation dose to as low as reasonably achievable. COMPARISON: CT abdomen and pelvis reported separately today. CLINICAL HISTORY: 64 year old female with left side pain radiating for 4 days. FINDINGS: Normal lumbar segmentation with mild degenerative lumbar scoliosis. BONES AND ALIGNMENT: Normal vertebral body heights. No acute fracture or suspicious bone lesion. Mild grade 1 anterolisthesis of L4 on L5 (5 mm). Intact visible sacrum and SI joints. SOFT TISSUES: Lumbar paraspinal soft tissues are within normal limits. DEGENERATIVE CHANGES: Advanced lower thoracic spine degeneration partially visible at T11-T12  including moderate to severe facet hypertrophy greater on the right, asymmetric disc osteophyte complex (series 8 image 4) probable lower thoracic spinal and right foraminal stenosis at that level. T12-L1 subtle retrolisthesis. Disc bulging and endplate spurring. No spinal stenosis by CT. L1-L2 mild disc bulging and posterior element hypertrophy. No spinal stenosis. L2-L3 more moderate disc bulging and posterior ulnar hypertrophy. Mild spinal stenosis suspected (series 8 image 53). L3-L4 moderate circumferential disc bulge. Moderate to severe facet hypertrophy with vacuum facet on the left. Ligamentous hypertrophy. At least moderate spinal stenosis suspected on series 8 image 69. Moderate bilateral L3 neural foraminal stenosis. L4-L5 grade 1 anterolisthesis with vacuum disc. Disc space loss. Circumferential disc osteophyte complex. Severe facet and ligamentum flavum hypertrophy. Bilateral vacuum facet. Severe spinal and left greater than right L4 neural foraminal stenosis by CT. L5-S1 severe disc space loss and vacuum disc. Circumferential disc osteophyte complex asymmetric to the right. Moderate facet hypertrophy. No spinal stenosis. Moderate to severe L5 neural foraminal stenosis greater on the right. IMPRESSION: 1. No acute osseous abnormality in the lumbar spine. 2. 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 Moderate or severe L3 through L5 nerve level foraminal stenosis. 3. Advanced lower thoracic degeneration at T11-T12 asymmetric to the right, with probable lower thoracic spinal and right foraminal stenosis. 4. CT abdomen and pelvis today reported separately. Electronically signed by: Helayne Hurst MD 11/21/2024 11:41 AM EST RP Workstation: [id number]     Procedures   Medications Ordered in the ED  HYDROmorphone  (DILAUDID ) injection 0.5 mg (0.5 mg Intravenous Given 11/21/24 1042)  ondansetron  (ZOFRAN ) injection 4 mg (4 mg Intravenous  Given 11/21/24 1042)  hydrALAZINE  (APRESOLINE ) injection 10 mg (10 mg Intravenous Given 11/21/24 1044)  HYDROmorphone  (DILAUDID ) injection 0.5 mg (0.5 mg Intravenous Given 11/21/24 1336)                                    Medical Decision Making Amount and/or Complexity of Data Reviewed Labs: ordered. Radiology: ordered.  Risk Prescription drug management.   Patient with degenerative changes in her lumbar spine.  In moderate to severe back pain.  Also poorly controlled blood pressure.  She will increase her Lopressor  so she is taking a full tablet in the evening and is given pain medicine for her lumbar pain     Final diagnoses:  Acute right-sided low back pain with sciatica, sciatica laterality unspecified    ED Discharge Orders          Ordered    oxyCODONE -acetaminophen  (PERCOCET/ROXICET) 5-325 MG tablet  Every 6 hours PRN  11/21/24 1519    predniSONE  (DELTASONE ) 10 MG tablet  Daily        11/21/24 1519               Suzette Pac, MD 11/22/24 2027  "

## 2024-12-10 ENCOUNTER — Ambulatory Visit: Admitting: Cardiology

## 2024-12-24 ENCOUNTER — Ambulatory Visit: Admitting: Cardiology

## 2025-03-11 ENCOUNTER — Encounter: Admitting: Family Medicine
